# Patient Record
Sex: Female | Born: 1950 | Race: White | Hispanic: Yes | Marital: Married | Smoking: Never smoker
Health system: Southern US, Community
[De-identification: ages and names within clinical notes are randomized; demographics above are authoritative.]

---

## 2020-08-27 ENCOUNTER — Inpatient Hospital Stay (HOSPITAL_COMMUNITY)
Admission: EM | Admit: 2020-08-27 | Discharge: 2020-09-18 | DRG: 981 | Disposition: E | Payer: Medicare HMO | Attending: Internal Medicine | Admitting: Internal Medicine

## 2020-08-27 DIAGNOSIS — I428 Other cardiomyopathies: Secondary | ICD-10-CM | POA: Diagnosis present

## 2020-08-27 DIAGNOSIS — Z7984 Long term (current) use of oral hypoglycemic drugs: Secondary | ICD-10-CM

## 2020-08-27 DIAGNOSIS — E871 Hypo-osmolality and hyponatremia: Secondary | ICD-10-CM | POA: Diagnosis not present

## 2020-08-27 DIAGNOSIS — E43 Unspecified severe protein-calorie malnutrition: Secondary | ICD-10-CM | POA: Diagnosis present

## 2020-08-27 DIAGNOSIS — I130 Hypertensive heart and chronic kidney disease with heart failure and stage 1 through stage 4 chronic kidney disease, or unspecified chronic kidney disease: Secondary | ICD-10-CM | POA: Diagnosis present

## 2020-08-27 DIAGNOSIS — Z6829 Body mass index (BMI) 29.0-29.9, adult: Secondary | ICD-10-CM

## 2020-08-27 DIAGNOSIS — I69354 Hemiplegia and hemiparesis following cerebral infarction affecting left non-dominant side: Secondary | ICD-10-CM

## 2020-08-27 DIAGNOSIS — N1832 Chronic kidney disease, stage 3b: Secondary | ICD-10-CM | POA: Diagnosis present

## 2020-08-27 DIAGNOSIS — E1169 Type 2 diabetes mellitus with other specified complication: Secondary | ICD-10-CM | POA: Diagnosis present

## 2020-08-27 DIAGNOSIS — D582 Other hemoglobinopathies: Secondary | ICD-10-CM

## 2020-08-27 DIAGNOSIS — Z79899 Other long term (current) drug therapy: Secondary | ICD-10-CM

## 2020-08-27 DIAGNOSIS — I5021 Acute systolic (congestive) heart failure: Secondary | ICD-10-CM | POA: Diagnosis not present

## 2020-08-27 DIAGNOSIS — I69398 Other sequelae of cerebral infarction: Secondary | ICD-10-CM

## 2020-08-27 DIAGNOSIS — R0602 Shortness of breath: Secondary | ICD-10-CM

## 2020-08-27 DIAGNOSIS — Z794 Long term (current) use of insulin: Secondary | ICD-10-CM

## 2020-08-27 DIAGNOSIS — I313 Pericardial effusion (noninflammatory): Secondary | ICD-10-CM | POA: Diagnosis present

## 2020-08-27 DIAGNOSIS — E872 Acidosis: Secondary | ICD-10-CM | POA: Diagnosis not present

## 2020-08-27 DIAGNOSIS — E1165 Type 2 diabetes mellitus with hyperglycemia: Secondary | ICD-10-CM

## 2020-08-27 DIAGNOSIS — E1122 Type 2 diabetes mellitus with diabetic chronic kidney disease: Secondary | ICD-10-CM | POA: Diagnosis present

## 2020-08-27 DIAGNOSIS — R0600 Dyspnea, unspecified: Secondary | ICD-10-CM

## 2020-08-27 DIAGNOSIS — I462 Cardiac arrest due to underlying cardiac condition: Secondary | ICD-10-CM | POA: Diagnosis not present

## 2020-08-27 DIAGNOSIS — R569 Unspecified convulsions: Secondary | ICD-10-CM | POA: Diagnosis not present

## 2020-08-27 DIAGNOSIS — I4891 Unspecified atrial fibrillation: Secondary | ICD-10-CM

## 2020-08-27 DIAGNOSIS — R109 Unspecified abdominal pain: Secondary | ICD-10-CM

## 2020-08-27 DIAGNOSIS — D509 Iron deficiency anemia, unspecified: Secondary | ICD-10-CM | POA: Diagnosis present

## 2020-08-27 DIAGNOSIS — K7200 Acute and subacute hepatic failure without coma: Secondary | ICD-10-CM | POA: Diagnosis not present

## 2020-08-27 DIAGNOSIS — E8809 Other disorders of plasma-protein metabolism, not elsewhere classified: Secondary | ICD-10-CM | POA: Diagnosis present

## 2020-08-27 DIAGNOSIS — I509 Heart failure, unspecified: Secondary | ICD-10-CM

## 2020-08-27 DIAGNOSIS — Z452 Encounter for adjustment and management of vascular access device: Secondary | ICD-10-CM

## 2020-08-27 DIAGNOSIS — I4892 Unspecified atrial flutter: Secondary | ICD-10-CM | POA: Diagnosis not present

## 2020-08-27 DIAGNOSIS — I480 Paroxysmal atrial fibrillation: Secondary | ICD-10-CM | POA: Diagnosis not present

## 2020-08-27 DIAGNOSIS — Z20822 Contact with and (suspected) exposure to covid-19: Secondary | ICD-10-CM | POA: Diagnosis present

## 2020-08-27 DIAGNOSIS — G9340 Encephalopathy, unspecified: Secondary | ICD-10-CM | POA: Diagnosis present

## 2020-08-27 DIAGNOSIS — E11649 Type 2 diabetes mellitus with hypoglycemia without coma: Secondary | ICD-10-CM | POA: Diagnosis not present

## 2020-08-27 DIAGNOSIS — I081 Rheumatic disorders of both mitral and tricuspid valves: Secondary | ICD-10-CM | POA: Diagnosis present

## 2020-08-27 DIAGNOSIS — A419 Sepsis, unspecified organism: Secondary | ICD-10-CM | POA: Diagnosis not present

## 2020-08-27 DIAGNOSIS — Z515 Encounter for palliative care: Secondary | ICD-10-CM

## 2020-08-27 DIAGNOSIS — G40909 Epilepsy, unspecified, not intractable, without status epilepticus: Secondary | ICD-10-CM | POA: Diagnosis not present

## 2020-08-27 DIAGNOSIS — J9601 Acute respiratory failure with hypoxia: Secondary | ICD-10-CM | POA: Diagnosis not present

## 2020-08-27 DIAGNOSIS — N179 Acute kidney failure, unspecified: Secondary | ICD-10-CM | POA: Diagnosis not present

## 2020-08-27 DIAGNOSIS — Z8249 Family history of ischemic heart disease and other diseases of the circulatory system: Secondary | ICD-10-CM

## 2020-08-27 DIAGNOSIS — D696 Thrombocytopenia, unspecified: Secondary | ICD-10-CM | POA: Diagnosis present

## 2020-08-27 DIAGNOSIS — Z7982 Long term (current) use of aspirin: Secondary | ICD-10-CM

## 2020-08-27 DIAGNOSIS — E876 Hypokalemia: Secondary | ICD-10-CM | POA: Diagnosis not present

## 2020-08-27 DIAGNOSIS — R570 Cardiogenic shock: Secondary | ICD-10-CM | POA: Diagnosis not present

## 2020-08-27 DIAGNOSIS — I10 Essential (primary) hypertension: Secondary | ICD-10-CM | POA: Diagnosis present

## 2020-08-27 DIAGNOSIS — E785 Hyperlipidemia, unspecified: Secondary | ICD-10-CM | POA: Diagnosis present

## 2020-08-27 DIAGNOSIS — I952 Hypotension due to drugs: Secondary | ICD-10-CM

## 2020-08-27 DIAGNOSIS — R6521 Severe sepsis with septic shock: Secondary | ICD-10-CM | POA: Diagnosis not present

## 2020-08-27 DIAGNOSIS — I472 Ventricular tachycardia: Secondary | ICD-10-CM | POA: Diagnosis not present

## 2020-08-27 DIAGNOSIS — Z4509 Encounter for adjustment and management of other cardiac device: Secondary | ICD-10-CM

## 2020-08-28 ENCOUNTER — Inpatient Hospital Stay (HOSPITAL_COMMUNITY): Payer: Medicare HMO

## 2020-08-28 ENCOUNTER — Observation Stay (HOSPITAL_COMMUNITY): Payer: Medicare HMO

## 2020-08-28 ENCOUNTER — Emergency Department (HOSPITAL_COMMUNITY): Payer: Medicare HMO

## 2020-08-28 ENCOUNTER — Encounter (HOSPITAL_COMMUNITY): Payer: Self-pay | Admitting: Internal Medicine

## 2020-08-28 DIAGNOSIS — I4892 Unspecified atrial flutter: Secondary | ICD-10-CM | POA: Diagnosis not present

## 2020-08-28 DIAGNOSIS — R569 Unspecified convulsions: Secondary | ICD-10-CM | POA: Diagnosis present

## 2020-08-28 DIAGNOSIS — E871 Hypo-osmolality and hyponatremia: Secondary | ICD-10-CM | POA: Diagnosis not present

## 2020-08-28 DIAGNOSIS — I959 Hypotension, unspecified: Secondary | ICD-10-CM | POA: Diagnosis not present

## 2020-08-28 DIAGNOSIS — G40909 Epilepsy, unspecified, not intractable, without status epilepticus: Secondary | ICD-10-CM | POA: Diagnosis present

## 2020-08-28 DIAGNOSIS — E1169 Type 2 diabetes mellitus with other specified complication: Secondary | ICD-10-CM | POA: Diagnosis present

## 2020-08-28 DIAGNOSIS — I69354 Hemiplegia and hemiparesis following cerebral infarction affecting left non-dominant side: Secondary | ICD-10-CM | POA: Diagnosis not present

## 2020-08-28 DIAGNOSIS — I69398 Other sequelae of cerebral infarction: Secondary | ICD-10-CM | POA: Diagnosis not present

## 2020-08-28 DIAGNOSIS — I952 Hypotension due to drugs: Secondary | ICD-10-CM | POA: Diagnosis not present

## 2020-08-28 DIAGNOSIS — E43 Unspecified severe protein-calorie malnutrition: Secondary | ICD-10-CM | POA: Diagnosis present

## 2020-08-28 DIAGNOSIS — I5021 Acute systolic (congestive) heart failure: Secondary | ICD-10-CM | POA: Diagnosis not present

## 2020-08-28 DIAGNOSIS — N179 Acute kidney failure, unspecified: Secondary | ICD-10-CM | POA: Diagnosis not present

## 2020-08-28 DIAGNOSIS — J9601 Acute respiratory failure with hypoxia: Secondary | ICD-10-CM | POA: Diagnosis not present

## 2020-08-28 DIAGNOSIS — Z20822 Contact with and (suspected) exposure to covid-19: Secondary | ICD-10-CM | POA: Diagnosis present

## 2020-08-28 DIAGNOSIS — I472 Ventricular tachycardia: Secondary | ICD-10-CM | POA: Diagnosis not present

## 2020-08-28 DIAGNOSIS — I10 Essential (primary) hypertension: Secondary | ICD-10-CM

## 2020-08-28 DIAGNOSIS — R6521 Severe sepsis with septic shock: Secondary | ICD-10-CM | POA: Diagnosis not present

## 2020-08-28 DIAGNOSIS — E872 Acidosis: Secondary | ICD-10-CM | POA: Diagnosis not present

## 2020-08-28 DIAGNOSIS — R748 Abnormal levels of other serum enzymes: Secondary | ICD-10-CM | POA: Diagnosis not present

## 2020-08-28 DIAGNOSIS — K7200 Acute and subacute hepatic failure without coma: Secondary | ICD-10-CM | POA: Diagnosis not present

## 2020-08-28 DIAGNOSIS — N1832 Chronic kidney disease, stage 3b: Secondary | ICD-10-CM | POA: Diagnosis present

## 2020-08-28 DIAGNOSIS — D696 Thrombocytopenia, unspecified: Secondary | ICD-10-CM | POA: Diagnosis present

## 2020-08-28 DIAGNOSIS — E11649 Type 2 diabetes mellitus with hypoglycemia without coma: Secondary | ICD-10-CM | POA: Diagnosis not present

## 2020-08-28 DIAGNOSIS — I480 Paroxysmal atrial fibrillation: Secondary | ICD-10-CM | POA: Diagnosis not present

## 2020-08-28 DIAGNOSIS — Z515 Encounter for palliative care: Secondary | ICD-10-CM | POA: Diagnosis not present

## 2020-08-28 DIAGNOSIS — E1165 Type 2 diabetes mellitus with hyperglycemia: Secondary | ICD-10-CM

## 2020-08-28 DIAGNOSIS — A419 Sepsis, unspecified organism: Secondary | ICD-10-CM | POA: Diagnosis not present

## 2020-08-28 DIAGNOSIS — R570 Cardiogenic shock: Secondary | ICD-10-CM | POA: Diagnosis not present

## 2020-08-28 DIAGNOSIS — I081 Rheumatic disorders of both mitral and tricuspid valves: Secondary | ICD-10-CM | POA: Diagnosis present

## 2020-08-28 DIAGNOSIS — E1122 Type 2 diabetes mellitus with diabetic chronic kidney disease: Secondary | ICD-10-CM | POA: Diagnosis present

## 2020-08-28 DIAGNOSIS — G9340 Encephalopathy, unspecified: Secondary | ICD-10-CM | POA: Diagnosis present

## 2020-08-28 DIAGNOSIS — Z794 Long term (current) use of insulin: Secondary | ICD-10-CM

## 2020-08-28 DIAGNOSIS — I4891 Unspecified atrial fibrillation: Secondary | ICD-10-CM | POA: Diagnosis not present

## 2020-08-28 DIAGNOSIS — D509 Iron deficiency anemia, unspecified: Secondary | ICD-10-CM | POA: Diagnosis present

## 2020-08-28 DIAGNOSIS — I130 Hypertensive heart and chronic kidney disease with heart failure and stage 1 through stage 4 chronic kidney disease, or unspecified chronic kidney disease: Secondary | ICD-10-CM | POA: Diagnosis present

## 2020-08-28 HISTORY — DX: Chronic kidney disease, stage 3b: N18.32

## 2020-08-28 HISTORY — DX: Type 2 diabetes mellitus with hyperglycemia: E11.65

## 2020-08-28 HISTORY — DX: Type 2 diabetes mellitus with other specified complication: E11.69

## 2020-08-28 HISTORY — DX: Essential (primary) hypertension: I10

## 2020-08-28 HISTORY — DX: Long term (current) use of insulin: Z79.4

## 2020-08-28 LAB — PROTIME-INR
INR: 1.3 — ABNORMAL HIGH (ref 0.8–1.2)
Prothrombin Time: 15.9 seconds — ABNORMAL HIGH (ref 11.4–15.2)

## 2020-08-28 LAB — COMPREHENSIVE METABOLIC PANEL
ALT: 19 U/L (ref 0–44)
ALT: 20 U/L (ref 0–44)
AST: 32 U/L (ref 15–41)
AST: 34 U/L (ref 15–41)
Albumin: 3.5 g/dL (ref 3.5–5.0)
Albumin: 3.2 g/dL — ABNORMAL LOW (ref 3.5–5.0)
Alkaline Phosphatase: 66 U/L (ref 38–126)
Alkaline Phosphatase: 62 U/L (ref 38–126)
Anion gap: 14 (ref 5–15)
Anion gap: 14 (ref 5–15)
BUN: 21 mg/dL (ref 8–23)
BUN: 21 mg/dL (ref 8–23)
CO2: 21 mmol/L — ABNORMAL LOW (ref 22–32)
CO2: 22 mmol/L (ref 22–32)
Calcium: 9.9 mg/dL (ref 8.9–10.3)
Calcium: 9.7 mg/dL (ref 8.9–10.3)
Chloride: 98 mmol/L (ref 98–111)
Chloride: 98 mmol/L (ref 98–111)
Creatinine, Ser: 1.74 mg/dL — ABNORMAL HIGH (ref 0.44–1.00)
Creatinine, Ser: 1.71 mg/dL — ABNORMAL HIGH (ref 0.44–1.00)
GFR, Estimated: 31 mL/min — ABNORMAL LOW (ref >60)
GFR, Estimated: 32 mL/min — ABNORMAL LOW (ref >60)
Glucose, Bld: 339 mg/dL — ABNORMAL HIGH (ref 70–99)
Glucose, Bld: 354 mg/dL — ABNORMAL HIGH (ref 70–99)
Potassium: 4.7 mmol/L (ref 3.5–5.1)
Potassium: 4.9 mmol/L (ref 3.5–5.1)
Sodium: 133 mmol/L — ABNORMAL LOW (ref 135–145)
Sodium: 134 mmol/L — ABNORMAL LOW (ref 135–145)
Total Bilirubin: 1.3 mg/dL — ABNORMAL HIGH (ref 0.3–1.2)
Total Bilirubin: 1.4 mg/dL — ABNORMAL HIGH (ref 0.3–1.2)
Total Protein: 7.2 g/dL (ref 6.5–8.1)
Total Protein: 6.7 g/dL (ref 6.5–8.1)

## 2020-08-28 LAB — LIPID PANEL
Cholesterol: 111 mg/dL (ref 0–200)
HDL: 54 mg/dL (ref >40)
LDL Cholesterol: 40 mg/dL (ref 0–99)
Total CHOL/HDL Ratio: 2.1 RATIO
Triglycerides: 87 mg/dL (ref <150)
VLDL: 17 mg/dL (ref 0–40)

## 2020-08-28 LAB — CBC
HCT: 38.8 % (ref 36.0–46.0)
HCT: 41.6 % (ref 36.0–46.0)
Hemoglobin: 12.3 g/dL (ref 12.0–15.0)
Hemoglobin: 12.5 g/dL (ref 12.0–15.0)
MCH: 22 pg — ABNORMAL LOW (ref 26.0–34.0)
MCH: 22.8 pg — ABNORMAL LOW (ref 26.0–34.0)
MCHC: 30 g/dL (ref 30.0–36.0)
MCHC: 31.7 g/dL (ref 30.0–36.0)
MCV: 71.9 fL — ABNORMAL LOW (ref 80.0–100.0)
MCV: 73.4 fL — ABNORMAL LOW (ref 80.0–100.0)
Platelets: 147 10*3/uL — ABNORMAL LOW (ref 150–400)
Platelets: 165 10*3/uL (ref 150–400)
RBC: 5.4 MIL/uL — ABNORMAL HIGH (ref 3.87–5.11)
RBC: 5.67 MIL/uL — ABNORMAL HIGH (ref 3.87–5.11)
RDW: 17 % — ABNORMAL HIGH (ref 11.5–15.5)
RDW: 17.1 % — ABNORMAL HIGH (ref 11.5–15.5)
WBC: 6.4 10*3/uL (ref 4.0–10.5)
WBC: 6.5 10*3/uL (ref 4.0–10.5)
nRBC: 0 % (ref 0.0–0.2)
nRBC: 0 % (ref 0.0–0.2)

## 2020-08-28 LAB — I-STAT CHEM 8, ED
BUN: 22 mg/dL (ref 8–23)
Calcium, Ion: 1.06 mmol/L — ABNORMAL LOW (ref 1.15–1.40)
Chloride: 100 mmol/L (ref 98–111)
Creatinine, Ser: 1.6 mg/dL — ABNORMAL HIGH (ref 0.44–1.00)
Glucose, Bld: 343 mg/dL — ABNORMAL HIGH (ref 70–99)
HCT: 44 % (ref 36.0–46.0)
Hemoglobin: 15 g/dL (ref 12.0–15.0)
Potassium: 4.7 mmol/L (ref 3.5–5.1)
Sodium: 135 mmol/L (ref 135–145)
TCO2: 23 mmol/L (ref 22–32)

## 2020-08-28 LAB — CBG MONITORING, ED
Glucose-Capillary: 121 mg/dL — ABNORMAL HIGH (ref 70–99)
Glucose-Capillary: 269 mg/dL — ABNORMAL HIGH (ref 70–99)
Glucose-Capillary: 314 mg/dL — ABNORMAL HIGH (ref 70–99)
Glucose-Capillary: 324 mg/dL — ABNORMAL HIGH (ref 70–99)
Glucose-Capillary: 93 mg/dL (ref 70–99)

## 2020-08-28 LAB — DIFFERENTIAL
Abs Immature Granulocytes: 0.01 10*3/uL (ref 0.00–0.07)
Basophils Absolute: 0 10*3/uL (ref 0.0–0.1)
Basophils Relative: 0 %
Eosinophils Absolute: 0 10*3/uL (ref 0.0–0.5)
Eosinophils Relative: 0 %
Immature Granulocytes: 0 %
Lymphocytes Relative: 21 %
Lymphs Abs: 1.3 10*3/uL (ref 0.7–4.0)
Monocytes Absolute: 0.4 10*3/uL (ref 0.1–1.0)
Monocytes Relative: 6 %
Neutro Abs: 4.6 10*3/uL (ref 1.7–7.7)
Neutrophils Relative %: 73 %

## 2020-08-28 LAB — SARS CORONAVIRUS 2 (TAT 6-24 HRS): SARS Coronavirus 2: NEGATIVE

## 2020-08-28 LAB — HEMOGLOBIN A1C
Hgb A1c MFr Bld: 7.9 % — ABNORMAL HIGH (ref 4.8–5.6)
Mean Plasma Glucose: 180.03 mg/dL

## 2020-08-28 LAB — APTT: aPTT: 30 seconds (ref 24–36)

## 2020-08-28 MED ORDER — SODIUM CHLORIDE 0.9 % IV BOLUS
1000.0000 mL | Freq: Once | INTRAVENOUS | Status: AC
  Administered 2020-08-28: 1000 mL via INTRAVENOUS

## 2020-08-28 MED ORDER — ASPIRIN EC 81 MG PO TBEC
81.0000 mg | DELAYED_RELEASE_TABLET | Freq: Every day | ORAL | Status: DC
  Administered 2020-08-29 – 2020-09-04 (×6): 81 mg via ORAL
  Filled 2020-08-28 (×8): qty 1

## 2020-08-28 MED ORDER — ENOXAPARIN SODIUM 40 MG/0.4ML IJ SOSY
40.0000 mg | PREFILLED_SYRINGE | INTRAMUSCULAR | Status: DC
  Administered 2020-08-28 – 2020-09-01 (×5): 40 mg via SUBCUTANEOUS
  Filled 2020-08-28 (×5): qty 0.4

## 2020-08-28 MED ORDER — IOHEXOL 350 MG/ML SOLN
50.0000 mL | Freq: Once | INTRAVENOUS | Status: AC | PRN
  Administered 2020-08-28: 50 mL via INTRAVENOUS

## 2020-08-28 MED ORDER — INSULIN ASPART 100 UNIT/ML IJ SOLN
0.0000 [IU] | Freq: Three times a day (TID) | INTRAMUSCULAR | Status: DC
  Administered 2020-08-28: 11 [IU] via SUBCUTANEOUS
  Administered 2020-08-28: 8 [IU] via SUBCUTANEOUS

## 2020-08-28 MED ORDER — INSULIN GLARGINE-YFGN 100 UNIT/ML ~~LOC~~ SOLN
15.0000 [IU] | Freq: Every day | SUBCUTANEOUS | Status: DC
  Administered 2020-08-29 – 2020-09-01 (×4): 15 [IU] via SUBCUTANEOUS
  Filled 2020-08-28 (×7): qty 0.15

## 2020-08-28 MED ORDER — POLYETHYLENE GLYCOL 3350 17 G PO PACK
17.0000 g | PACK | Freq: Every day | ORAL | Status: DC | PRN
  Administered 2020-08-31: 17 g via ORAL
  Filled 2020-08-28: qty 1

## 2020-08-28 MED ORDER — LORAZEPAM 2 MG/ML IJ SOLN
INTRAMUSCULAR | Status: AC
  Filled 2020-08-28: qty 4

## 2020-08-28 MED ORDER — LEVETIRACETAM IN NACL 1000 MG/100ML IV SOLN
INTRAVENOUS | Status: AC
  Administered 2020-08-28: 1000 mg via INTRAVENOUS
  Filled 2020-08-28: qty 200

## 2020-08-28 MED ORDER — INSULIN ASPART 100 UNIT/ML IJ SOLN
0.0000 [IU] | Freq: Every day | INTRAMUSCULAR | Status: DC
  Administered 2020-08-29 – 2020-09-03 (×3): 2 [IU] via SUBCUTANEOUS

## 2020-08-28 MED ORDER — SODIUM CHLORIDE 0.9 % IV SOLN: INTRAVENOUS | Status: DC

## 2020-08-28 MED ORDER — ACETAMINOPHEN 650 MG RE SUPP: 650.0000 mg | RECTAL | Status: DC | PRN

## 2020-08-28 MED ORDER — INSULIN GLARGINE-YFGN 100 UNIT/ML ~~LOC~~ SOLN
30.0000 [IU] | Freq: Every day | SUBCUTANEOUS | Status: DC
  Filled 2020-08-28 (×2): qty 0.3

## 2020-08-28 MED ORDER — ONDANSETRON HCL 4 MG/2ML IJ SOLN
INTRAMUSCULAR | Status: AC
  Filled 2020-08-28: qty 2

## 2020-08-28 MED ORDER — ONDANSETRON HCL 4 MG/2ML IJ SOLN
4.0000 mg | Freq: Four times a day (QID) | INTRAMUSCULAR | Status: DC | PRN
  Administered 2020-08-31 – 2020-09-07 (×11): 4 mg via INTRAVENOUS
  Filled 2020-08-28 (×12): qty 2

## 2020-08-28 MED ORDER — ACETAMINOPHEN 160 MG/5ML PO SOLN: 650.0000 mg | ORAL | Status: DC | PRN

## 2020-08-28 MED ORDER — ACETAMINOPHEN 325 MG PO TABS
650.0000 mg | ORAL_TABLET | ORAL | Status: DC | PRN
  Administered 2020-08-29: 650 mg via ORAL
  Filled 2020-08-28: qty 2

## 2020-08-28 MED ORDER — LEVETIRACETAM IN NACL 500 MG/100ML IV SOLN
500.0000 mg | Freq: Two times a day (BID) | INTRAVENOUS | Status: DC
  Administered 2020-08-28 – 2020-08-29 (×2): 500 mg via INTRAVENOUS
  Filled 2020-08-28 (×4): qty 100

## 2020-08-28 MED ORDER — INSULIN ASPART 100 UNIT/ML IJ SOLN
0.0000 [IU] | Freq: Three times a day (TID) | INTRAMUSCULAR | Status: DC
  Administered 2020-08-28: 2 [IU] via SUBCUTANEOUS
  Administered 2020-08-29: 3 [IU] via SUBCUTANEOUS
  Administered 2020-08-29: 5 [IU] via SUBCUTANEOUS
  Administered 2020-08-29: 2 [IU] via SUBCUTANEOUS
  Administered 2020-08-30: 3 [IU] via SUBCUTANEOUS
  Administered 2020-08-30: 5 [IU] via SUBCUTANEOUS
  Administered 2020-08-30: 3 [IU] via SUBCUTANEOUS
  Administered 2020-09-01: 8 [IU] via SUBCUTANEOUS
  Administered 2020-09-01: 2 [IU] via SUBCUTANEOUS
  Administered 2020-09-02 (×2): 8 [IU] via SUBCUTANEOUS
  Administered 2020-09-02: 5 [IU] via SUBCUTANEOUS
  Administered 2020-09-03 (×2): 8 [IU] via SUBCUTANEOUS
  Administered 2020-09-03: 5 [IU] via SUBCUTANEOUS
  Administered 2020-09-04 – 2020-09-05 (×4): 2 [IU] via SUBCUTANEOUS
  Administered 2020-09-06: 5 [IU] via SUBCUTANEOUS
  Administered 2020-09-06 – 2020-09-08 (×3): 3 [IU] via SUBCUTANEOUS

## 2020-08-28 MED ORDER — LEVETIRACETAM IN NACL 1000 MG/100ML IV SOLN
1000.0000 mg | INTRAVENOUS | Status: AC
Start: 2020-08-28 — End: 2020-08-28

## 2020-08-28 MED ORDER — IOHEXOL 350 MG/ML SOLN
75.0000 mL | Freq: Once | INTRAVENOUS | Status: AC | PRN
  Administered 2020-08-28: 75 mL via INTRAVENOUS

## 2020-08-28 MED ORDER — STROKE: EARLY STAGES OF RECOVERY BOOK
Freq: Once | Status: AC
  Filled 2020-08-28: qty 1

## 2020-08-28 MED ORDER — SODIUM CHLORIDE 0.9% FLUSH: 3.0000 mL | Freq: Once | INTRAVENOUS | Status: DC

## 2020-08-28 MED ORDER — LEVETIRACETAM IN NACL 1000 MG/100ML IV SOLN
INTRAVENOUS | Status: AC
  Filled 2020-08-28: qty 200

## 2020-08-28 MED ORDER — LORAZEPAM 2 MG/ML IJ SOLN: 2.0000 mg | Freq: Once | INTRAMUSCULAR | Status: DC | PRN

## 2020-08-28 MED ORDER — ROSUVASTATIN CALCIUM 5 MG PO TABS
10.0000 mg | ORAL_TABLET | Freq: Every day | ORAL | Status: DC
  Administered 2020-08-29 – 2020-08-30 (×2): 10 mg via ORAL
  Filled 2020-08-28 (×3): qty 2

## 2020-08-28 NOTE — Code Documentation (Signed)
Stroke Response Nurse Documentation Code Documentation  Erica Duncan is a 70 y.o. female arriving to [facility] ED via [facility] on 8/11 with unknown past medical hx. Code stroke was activated by EMS. Patient from home where she was LKW at 1930 and now complaining of Left sided weakness and aphasia . On No antithrombotic. Stroke team at the bedside on patient arrival. Labs drawn and patient cleared for CT by Dr. Madilyn Hook. Patient to CT with team. NIHSS 10, see documentation for details and code stroke times. Patient with left gaze preference , left arm weakness, left leg weakness, left decreased sensation, Global aphasia , dysarthria , and Sensory  neglect on exam. The following imaging was completed:  CT, CTA head and neck. Patient is not a candidate for tPA due to Out of window. Bedside handoff with ED RN Josh.    Rose Fillers  Rapid Response RN

## 2020-08-28 NOTE — Progress Notes (Signed)
EEG complete - results pending 

## 2020-08-28 NOTE — ED Notes (Signed)
Pt too medicated from ativan to attempt the swallow screeen

## 2020-08-28 NOTE — ED Triage Notes (Signed)
Pt BIB [facility] for stroke-like symptoms.  LKW 1930 per family.  Symptoms included difficulty speaking  and walking as reported by family.  EMS reported Left sided weakness with facial droop and aphasia. Symptoms were intermittent in transit.  EMS reports pt was A&O x4 on scene with help of translation by family.  Pt has hx of DM and HTN.  EMS reports BP 160/83, HR 118  Daughter number is [phone] per [facility]

## 2020-08-28 NOTE — Progress Notes (Signed)
Subjective: Daughter at bedside states patient continues to be very lethargic.  States earlier she was able to follow some commands but since 9am or so in the morning she has not been waking up to follow any commands.  Also reports that she has noticed intermittent left upper extremity twitching.  ROS: Unable to obtain due to poor mental status  Examination  Vital signs in last 24 hours: Temp:  [100.4 F (38 C)] 100.4 F (38 C) (08/11 1147) Pulse Rate:  [93-118] 99 (08/11 1100) Resp:  [16-32] 18 (08/11 1100) BP: (81-119)/(46-89) 110/69 (08/11 1100) SpO2:  [95 %-100 %] 96 % (08/11 1100) Weight:  [60.8 kg] 60.8 kg (08/11 0010)  General: lying in bed, NAD CVS: pulse-normal rate and rhythm RS: breathing comfortably, CTAB Extremities: normal, warm  Neuro: Moans but does not open eyes to noxious stimuli, does not follow commands, PERRLA, no gaze deviation, no apparent facial asymmetry, withdraws to noxious stimuli in all 4 extremities  Basic Metabolic Panel: Recent Labs  Lab 08/28/20 0001 08/28/20 0012 08/28/20 0801  NA 133* 135 134*  K 4.7 4.7 4.9  CL 98 100 98  CO2 21*  --  22  GLUCOSE 339* 343* 354*  BUN 21 22 21   CREATININE 1.74* 1.60* 1.71*  CALCIUM 9.9  --  9.7    CBC: Recent Labs  Lab 08/28/20 0001 08/28/20 0012 08/28/20 0801  WBC 6.4  --  6.5  NEUTROABS 4.6  --   --   HGB 12.5 15.0 12.3  HCT 41.6 44.0 38.8  MCV 73.4*  --  71.9*  PLT 165  --  147*     Coagulation Studies: Recent Labs    08/28/20 0001  LABPROT 15.9*  INR 1.3*    Imaging MRI brain without contrast 08/28/2020: Motion degraded study.  The study was terminated prematurely. Negative for acute infarct.  CT head without contrast 08/28/2020: No acute intracranial infarct or other abnormality. ASPECTS is 10.Chronic right MCA distribution infarct involving the posterior right temporoparietal region. Underlying atrophy with moderate chronic microvascular ischemic disease.  CTA head and neck  8/11/20212:  Negative CTA for emergent large vessel occlusion. Mild atheromatous change about the carotid siphons without hemodynamically significant or correctable stenosis. Moderate right greater than left layering pleural effusions, partially visualized.  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 Additionally there is evidence of cortical dysfunction in right hemisphere likely secondary to underlying structural abnormality/stroke. There is also moderate diffuse encephalopathy, nonspecific to etiology. No seizures were seen throughout the recording.  Recommendations -Continue Keppra 500 mg twice daily. -As patient continues to be lethargic, will obtain video EEG to look for intermittent seizures.  If it shows any further seizures, will increase Keppra -Patient is afebrile, no leukocytosis, no neck rigidity. Therefore low suspicion for meningitis.  However, if no evidence of seizures on EEG, will consider lumbar puncture -Continue seizure precautions -As needed IV Ativan 2 mg for clinical seizure-like activity -Management of rest of comorbidities per primary team  I have spent a total of  35 minutes with the patient reviewing hospital notes,  test results, labs and examining the patient as well as establishing an  assessment and plan that was discussed personally with the patient's daughter at bedside.  > 50% of time was spent in direct patient care.     Lindie Spruce Epilepsy [facility] For questions after 5pm please  refer to AMION to reach the Neurologist on call

## 2020-08-28 NOTE — ED Notes (Signed)
Pt still sleeping...

## 2020-08-28 NOTE — Progress Notes (Signed)
Patient ID: Erica Duncan, female   DOB: 12-Jul-1950, 70 y.o.   MRN: [id number] Patient admitted early this morning for left-sided weakness and confusion and neurology has been consulted. I have reviewed patient's medical records including this morning's H&P, current vitals, labs, medications myself.  Follow MRI of brain and EEG.  Follow further neurology recommendations.

## 2020-08-28 NOTE — Progress Notes (Signed)
Occupational Therapy Evaluation Patient Details Name: Erica Duncan MRN: [id number] DOB: 12/12/1950 Today's Date: 08/28/2020    History of Present Illness Pt is a 70yo female presenting to [facility] ED on 8/10 with acute-onset L weakness and confusion.  Pt was observed to have seizure like activity in ED.  MRI 8/11 with no acute findings. PMH: R MCA distribution infarct involving the posterior right temporoparietal region and L frontal infarct (2021 in [place] HTN, CKD.   Clinical Impression   Assessment limited by level of arousal. Discussed with nsg. BP 90/61 supine and 93/61 in chair position @ bed level. HR 95; SpO2 97 RA. Pt intermittently following commands and very difficult to arouse. Briefly opening eyes. At baseline, pt independent with ADL and mobility with residual cognitive and vision deficits from prior CVA. Will follow acutely and further assess need for rehab as level of arousal improves.     Follow Up Recommendations  Other (comment) (to be further assessed)    Equipment Recommendations       Recommendations for Other Services       Precautions / Restrictions Precautions Precautions: Fall      Mobility Bed Mobility Overal bed mobility: Needs Assistance             General bed mobility comments: Pt placed in chair position in bed (HOB >45 degrees) to see if pt's alertness would improve. Did note that pt would open eyes after repeated attempts. Was also able to state name and follow simple physical commands, but would quickly fall back asleep. Further mobility deferred secondary to lethargy.    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 Most likely L field cut     Perception     Praxis      Pertinent Vitals/Pain Pain Assessment: Faces Faces Pain Scale: No hurt     Hand Dominance Right   Extremity/Trunk Assessment Upper Extremity Assessment Upper Extremity Assessment:  (difficult to assess due to level of arousal)       Cervical / Trunk Assessment Cervical / Trunk Assessment: Normal   Communication Communication Communication: Prefers language other than English (Spanish, daughter requested to translate)   Cognition Arousal/Alertness: Lethargic;Suspect due to medications (? post ictal) Behavior During Therapy: Flat affect Overall Cognitive Status: Difficult to assess                                 General Comments: Pt very lethargic and generally unable to be roused. Pt was able to say her name and perform some basic physical movements,  but would quickly fall back to sleep.   General Comments  daughter present adn educated on need to further assess her Mom once level of arousal improves    Exercises     Shoulder Instructions      Home Living Family/patient expects to be discharged to:: Private residence Living Arrangements: Children Available Help at Discharge: Family (Pt's daughter available sometimes but unsure if 24/7 is possible) Type of Home: House Home Access: Stairs to enter Secretary/administrator of Steps: 4 Entrance Stairs-Rails: Right Home Layout: One level     Bathroom Shower/Tub: Producer, television/film/video: Handicapped height Bathroom Accessibility: Yes   Home Equipment: Cane - single point   Additional Comments: Family remodeling the other bathroom to make it more accessible      Prior Functioning/Environment Level of Independence: Independent        Comments:  since stroke has not driven; slower processing; vision deficits from prior CVA        OT Problem List: Decreased activity tolerance;Impaired balance (sitting and/or standing);Impaired vision/perception;Decreased coordination;Decreased cognition;Decreased safety awareness;Decreased knowledge of use of DME or AE      OT Treatment/Interventions: Self-care/ADL training;Therapeutic exercise;Neuromuscular education;DME and/or AE instruction;Therapeutic activities;Cognitive remediation/compensation;Visual/perceptual remediation/compensation;Patient/family education;Balance training    OT Goals(Current goals can be found in the care plan section) Acute Rehab OT Goals Patient Stated Goal: per family to get better and go home OT Goal Formulation: With family Time For Goal Achievement: 09/11/20 Potential to Achieve Goals: Good  OT Frequency: Min 2X/week   Barriers to D/C:            Co-evaluation PT/OT/SLP Co-Evaluation/Treatment: Yes Reason for Co-Treatment: Complexity of the patient's impairments (multi-system involvement);For patient/therapist safety   OT goals addressed during session: ADL's and self-care      AM-PAC OT "6 Clicks" Daily Activity     Outcome Measure Help from another person eating meals?: Total Help from another person taking care of personal grooming?: Total Help from another person toileting, which includes using toliet, bedpan, or urinal?: Total Help from another person bathing (including washing, rinsing, drying)?: Total Help from another person to put on and taking off regular upper body clothing?: Total Help from another person to put on and taking off regular lower body clothing?: Total 6 Click Score: 6   End of Session Nurse Communication: Other (comment) (level of arousal; BP)  Activity Tolerance: Patient limited by lethargy Patient left: in bed;with call bell/phone within reach;with bed alarm set;with family/visitor present  OT Visit Diagnosis: Other  abnormalities of gait and mobility (R26.89);Muscle weakness (generalized) (M62.81);Other symptoms and signs involving cognitive function                Time: 8115-7262 OT Time Calculation (min): 26 min Charges:  OT General Charges $OT Visit: 1 Visit OT Evaluation $OT Eval Moderate Complexity: 1 Mod  [name] Zabriskie, OT/L   Acute OT Clinical Specialist Acute Rehabilitation Services Pager [phone] Office [phone]   [facility] 08/28/2020, 4:47 PM

## 2020-08-28 NOTE — ED Provider Notes (Signed)
[facility] EMERGENCY DEPARTMENT Provider Note   CSN: [id number] Arrival date & time: 09/15/2020  2359  An emergency department physician performed an initial assessment on this suspected stroke patient at 0008.  History No chief complaint on file.   Erica Duncan is a 70 y.o. female.  The history is provided by the patient and the EMS personnel.  Erica Duncan is a 70 y.o. female who presents to the Emergency Department complaining of code stroke. She presents the emergency department by EMS for evaluation of code stroke. Level V caveat due to confusion. History is provided by EMS. Patient was with her daughter and last known well at 44. EMS was called out due to concern for possible stroke. She was found to have abnormal gait speech. EMS reports stuttering left-sided deficits. Symptoms are severe and waxing and waning.  Additional hx available from patient's daughter after patient's initial ED evaluation.   Daughter states that they have been travelling all day from [place]  Pt developed nausea and vomiting and around 9pm started to appear more confused and had difficulty with ambulation.  Has a hx/o DM, HTN, CVA.       No past medical history on file.  There are no problems to display for this patient.      OB History   No obstetric history on file.     No family history on file.     Home Medications Prior to Admission medications   Not on File    Allergies    Patient has no allergy information on record.  Review of Systems   Review of Systems  All other systems reviewed and are negative.  Physical Exam Updated Vital Signs BP 99/67   Pulse 93   Resp (!) 21   Wt 60.8 kg   SpO2 100%   Physical Exam Vitals and nursing note reviewed.  Constitutional:      Appearance: She is well-developed.  HENT:     Head: Normocephalic and atraumatic.  Cardiovascular:     Rate and Rhythm: Regular rhythm. Tachycardia  present.     Heart sounds: No murmur heard. Pulmonary:     Effort: Pulmonary effort is normal. No respiratory distress.     Breath sounds: Normal breath sounds.  Abdominal:     Palpations: Abdomen is soft.     Tenderness: There is no abdominal tenderness. There is no guarding or rebound.  Musculoskeletal:        General: No tenderness.  Skin:    General: Skin is warm and dry.  Neurological:     Mental Status: She is alert and oriented to person, place, and time.     Comments: No asymmetry of facial movement.  4/5 strength in LUE, 1/5 strength in LLE.  5/5 strength in RUE, RLE.    Psychiatric:        Behavior: Behavior normal.    ED Results / Procedures / Treatments   Labs (all labs ordered are listed, but only abnormal results are displayed) Labs Reviewed  PROTIME-INR - Abnormal; Notable for the following components:      Result Value   Prothrombin Time 15.9 (*)    INR 1.3 (*)    All other components within normal limits  CBC - Abnormal; Notable for the following components:   RBC 5.67 (*)    MCV 73.4 (*)    MCH 22.0 (*)    RDW 17.1 (*)    All other components within  normal limits  COMPREHENSIVE METABOLIC PANEL - Abnormal; Notable for the following components:   Sodium 133 (*)    CO2 21 (*)    Glucose, Bld 339 (*)    Creatinine, Ser 1.74 (*)    Total Bilirubin 1.3 (*)    GFR, Estimated 31 (*)    All other components within normal limits  I-STAT CHEM 8, ED - Abnormal; Notable for the following components:   Creatinine, Ser 1.60 (*)    Glucose, Bld 343 (*)    Calcium, Ion 1.06 (*)    All other components within normal limits  CBG MONITORING, ED - Abnormal; Notable for the following components:   Glucose-Capillary 324 (*)    All other components within normal limits  APTT  DIFFERENTIAL    EKG None  Radiology CT CEREBRAL PERFUSION W CONTRAST  Result Date: 08/28/2020 CLINICAL DATA:  Initial evaluation for acute stroke. EXAM: CT ANGIOGRAPHY HEAD AND NECK  TECHNIQUE: Multidetector CT imaging of the head and neck was performed using the standard protocol during bolus administration of intravenous contrast. Multiplanar CT image reconstructions and MIPs were obtained to evaluate the vascular anatomy. Carotid stenosis measurements (when applicable) are obtained utilizing NASCET criteria, using the distal internal carotid diameter as the denominator. CONTRAST:  56mL OMNIPAQUE IOHEXOL 350 MG/ML SOLN COMPARISON:  Prior head CT from earlier the same day. FINDINGS: CTA NECK FINDINGS Aortic arch: Visualized aortic arch normal in caliber with normal branch pattern. Mild atheromatous change about the arch itself. No hemodynamically significant stenosis about the origin the great vessels. Right carotid system: Right common and internal carotid arteries widely patent without stenosis, dissection or occlusion. Left carotid system: Left common and internal carotid arteries widely patent without stenosis, dissection or occlusion. Vertebral arteries: Both vertebral arteries arise from the subclavian arteries. No proximal subclavian artery stenosis. Both vertebral arteries widely patent without stenosis, dissection or occlusion. Skeleton: No visible acute osseous finding. No discrete or worrisome osseous lesions. Patient is edentulous. Other neck: No other acute soft tissue abnormality within the neck. No mass or adenopathy. Upper chest: Moderate right greater than left layering pleural effusions, partially visualized. Visualized upper chest demonstrates no other acute abnormality. Review of the MIP images confirms the above findings CTA HEAD FINDINGS Anterior circulation: Petrous segments widely patent. Mild atheromatous change within the carotid siphons without significant stenosis. A1 segments patent bilaterally. Left A1 duplicated, with duplication of the left M1 segment. Normal anterior communicating artery complex. Anterior cerebral arteries patent to their distal aspects without  stenosis. No M1 stenosis or occlusion. Normal MCA bifurcations. Distal MCA branches well perfused and symmetric. Posterior circulation: Both V4 segments patent to the vertebrobasilar junction without stenosis. Right PICA origin patent and normal. Left PICA not definitely seen. Basilar patent to its distal aspect without stenosis. Superior cerebellar arteries patent bilaterally. Both PCAs primarily supplied via the basilar well perfused to their distal aspects. Venous sinuses: Grossly patent allowing for timing the contrast bolus. Anatomic variants: None significant.  No aneurysm. Review of the MIP images confirms the above findings IMPRESSION: 1. Negative CTA for emergent large vessel occlusion. 2. Mild atheromatous change about the carotid siphons without hemodynamically significant or correctable stenosis. 3. Moderate right greater than left layering pleural effusions, partially visualized. Results discussed by telephone on 08/28/2020 at approximately 12:50 am with provider Brooke Dare , who verbally acknowledged these results. Electronically Signed   By: Rise Mu M.D.   On: 08/28/2020 01:11   CT HEAD CODE STROKE WO CONTRAST  Result Date:  08/28/2020 CLINICAL DATA:  Code stroke. Initial evaluation for acute left-sided weakness. EXAM: CT HEAD WITHOUT CONTRAST TECHNIQUE: Contiguous axial images were obtained from the base of the skull through the vertex without intravenous contrast. COMPARISON:  None available. FINDINGS: Brain: Age-related cerebral atrophy with chronic microvascular ischemic disease. Encephalomalacia involving the right insula and posterior right temporoparietal region consistent with a chronic right MCA distribution infarct. No acute intracranial hemorrhage. No visible acute large vessel territory infarct. No mass lesion or midline shift. No hydrocephalus or extra-axial fluid collection. Vascular: No hyperdense vessel. Scattered vascular calcifications noted within the carotid  siphons. Skull: Scalp soft tissues and calvarium within normal limits. Sinuses/Orbits: Globes and orbital soft tissues within normal limits. Paranasal sinuses are clear. No mastoid effusion. Other: None. ASPECTS [facility] Stroke Program Early CT Score) - Ganglionic level infarction (caudate, lentiform nuclei, internal capsule, insula, M1-M3 cortex): 7 - Supraganglionic infarction (M4-M6 cortex): 3 Total score (0-10 with 10 being normal): 10 IMPRESSION: 1. No acute intracranial infarct or other abnormality. 2. ASPECTS is 10. 3. 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 4. Underlying atrophy with moderate chronic microvascular ischemic disease. These results were communicated to Dr. Iver Nestle at 12:24 am on 08/28/2020 by text page via the [facility] messaging system. Electronically Signed   By: Rise Mu M.D.   On: 08/28/2020 00:26   CT ANGIO HEAD NECK W WO CM (CODE STROKE)  Result Date: 08/28/2020 CLINICAL DATA:  Initial evaluation for acute stroke. EXAM: CT ANGIOGRAPHY HEAD AND NECK TECHNIQUE: Multidetector CT imaging of the head and neck was performed using the standard protocol during bolus administration of intravenous contrast. Multiplanar CT image reconstructions and MIPs were obtained to evaluate the vascular anatomy. Carotid stenosis measurements (when applicable) are obtained utilizing NASCET criteria, using the distal internal carotid diameter as the denominator. CONTRAST:  27mL OMNIPAQUE IOHEXOL 350 MG/ML SOLN COMPARISON:  Prior head CT from earlier the same day. FINDINGS: CTA NECK FINDINGS Aortic arch: Visualized aortic arch normal in caliber with normal branch pattern. Mild atheromatous change about the arch itself. No hemodynamically significant stenosis about the origin the great vessels. Right carotid system: Right common and internal carotid arteries widely patent without stenosis, dissection or occlusion. Left carotid system: Left common and  internal carotid arteries widely patent without stenosis, dissection or occlusion. Vertebral arteries: Both vertebral arteries arise from the subclavian arteries. No proximal subclavian artery stenosis. Both vertebral arteries widely patent without stenosis, dissection or occlusion. Skeleton: No visible acute osseous finding. No discrete or worrisome osseous lesions. Patient is edentulous. Other neck: No other acute soft tissue abnormality within the neck. No mass or adenopathy. Upper chest: Moderate right greater than left layering pleural effusions, partially visualized. Visualized upper chest demonstrates no other acute abnormality. Review of the MIP images confirms the above findings CTA HEAD FINDINGS Anterior circulation: Petrous segments widely patent. Mild atheromatous change within the carotid siphons without significant stenosis. A1 segments patent bilaterally. Left A1 duplicated, with duplication of the left M1 segment. Normal anterior communicating artery complex. Anterior cerebral arteries patent to their distal aspects without stenosis. No M1 stenosis or occlusion. Normal MCA bifurcations. Distal MCA branches well perfused and symmetric. Posterior circulation: Both V4 segments patent to the vertebrobasilar junction without stenosis. Right PICA origin patent and normal. Left PICA not definitely seen. Basilar patent to its distal aspect without stenosis. Superior cerebellar arteries patent bilaterally. Both PCAs primarily supplied via the basilar well perfused to their distal aspects. Venous sinuses: Grossly patent allowing for  timing the contrast bolus. Anatomic variants: None significant.  No aneurysm. Review of the MIP images confirms the above findings IMPRESSION: 1. Negative CTA for emergent large vessel occlusion. 2. Mild atheromatous change about the carotid siphons without hemodynamically significant or correctable stenosis. 3. Moderate right greater than left layering pleural effusions, partially  visualized. Results discussed by telephone on 08/28/2020 at approximately 12:50 am with provider Brooke Dare , who verbally acknowledged these results. Electronically Signed   By: Rise Mu M.D.   On: 08/28/2020 01:11    Procedures Procedures   Medications Ordered in ED Medications  sodium chloride flush (NS) 0.9 % injection 3 mL (has no administration in time range)  ondansetron (ZOFRAN) 4 MG/2ML injection (has no administration in time range)  levETIRAcetam (KEPPRA) 1000 MG/100ML IVPB (has no administration in time range)  LORazepam (ATIVAN) 2 MG/ML injection (has no administration in time range)  levETIRAcetam (KEPPRA) 1000 MG/100ML IVPB (has no administration in time range)  iohexol (OMNIPAQUE) 350 MG/ML injection 75 mL (75 mLs Intravenous Contrast Given 08/28/20 0023)    ED Course  I have reviewed the triage vital signs and the nursing notes.  Pertinent labs & imaging results that were available during my care of the patient were reviewed by me and considered in my medical decision making (see chart for details).    MDM Rules/Calculators/A&P                          patient presents the emergency department as a code stroke for left-sided weakness. She was evaluated by neurology on her ED presentation. During patients ED evaluation she did develop focal seizures with a rhythmic movement to the left lower extremity. She was treated with Ativan with termination of the activity and loaded with Keppra. Plan to admit for ongoing treatment and workup.  Final Clinical Impression(s) / ED Diagnoses Final diagnoses:  Focal seizures [facility])    Rx / DC Orders ED Discharge Orders     None        Tilden Fossa, MD 08/28/20 [phone]

## 2020-08-28 NOTE — Progress Notes (Signed)
OT Cancellation Note  Patient Details Name: Erica Duncan MRN: [id number] DOB: 07-Apr-1950   Cancelled Treatment:    Reason Eval/Treat Not Completed: Patient at procedure or test/ unavailable (EEG. Will attempt later time)  [facility] 08/28/2020, 11:10 AM Luisa Dago, OT/L   Acute OT Clinical Specialist Acute Rehabilitation Services Pager [phone] Office [phone]

## 2020-08-28 NOTE — ED Notes (Signed)
keppra not due now

## 2020-08-28 NOTE — ED Notes (Signed)
At 2am Pt's pressures were noted to be 83/49.  At 203 they were 80/53.  This RN placed pt in trendelenberg and attempted to notify EDP who was unavailable d/t pt. Care.  This RN consulted with CN who suggested fluid bolus. This RN began a 500cc NS bolus at 2:05 as a BP of 88/59 registered.  At 209 BP was 81/46.  AT this time Dr. Iver Nestle came by room.  She sternal rubbed pt and pt responded to pain.  She suggested trying other arm with BP cuff. BP did not improve.  Dr. Princess Bruins new cuff.  BP registered at 107/97.  Pt is still somnolent but arousable.

## 2020-08-28 NOTE — ED Notes (Signed)
Informed provider about pt temp. 99.4 rectal.

## 2020-08-28 NOTE — ED Notes (Signed)
Notified provider about pt BP. 

## 2020-08-28 NOTE — ED Notes (Addendum)
See down time for keppra times and notes

## 2020-08-28 NOTE — ED Notes (Signed)
Pt could not have a mri not co-operating

## 2020-08-28 NOTE — ED Notes (Signed)
Pt had a stroke swallow screen completed prior to shift and SLP has came in to complete swallow screen. Diet ordered puree diet with thin liquids.

## 2020-08-28 NOTE — Progress Notes (Signed)
Physical Therapy Evaluation Patient Details Name: Erica Duncan MRN: [id number] DOB: 1950/04/11 Today's Date: 08/28/2020   History of Present Illness  Pt is a 70yo female presenting to [facility] ED on 8/10 with acute-onset L weakness and confusion.  Pt was observed to have seizure like activity in ED.  MRI 8/11 with no acute findings. 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   Clinical Impression  Pt presents with the impairments above and problems listed below. Pt lethargic upon entry and despite repeated attempts by PT, OT, and daughter to wake would not maintain wakeful state. Bed placed in chair position to see if lethargy would abate. Pt was able to state her name and to respond to some simple verbal commands. Further mobility deferred. Pt's family available intermittently and may be able to provide 24/7 supervision but cannot confirm at this time. Current recommendation is SNF-level therapies upon discharge pending progression and family's level of availability. We will continue to follow her acutely to promote independence with functional mobility.    Follow Up Recommendations SNF (pending progression; pt may discharge to [facility] if progresses well)    Equipment Recommendations  Other (comment) (TBD pending mobility progression)    Recommendations for Other Services       Precautions / Restrictions Precautions Precautions: Fall Restrictions Weight Bearing Restrictions: No      Mobility  Bed Mobility Overal bed mobility: Needs Assistance             General bed mobility comments: Pt placed in chair position in bed (HOB >45 degrees) to see if pt's alertness would improve. Did note that pt would open eyes after repeated attempts. Was also able to state name and follow simple physical commands, but would quickly fall back asleep. Further mobility deferred secondary to lethargy.    Transfers                    Ambulation/Gait                 Stairs            Wheelchair Mobility    Modified Rankin (Stroke Patients Only)       Balance                                             Pertinent Vitals/Pain Pain Assessment: Faces Faces Pain Scale: No hurt    Home Living Family/patient expects to be discharged to:: Private residence Living Arrangements: Children Available Help at Discharge: Family (Pt's daughter available sometimes but unsure if 24/7 is possible) Type of Home: House Home Access: Stairs to enter Entrance Stairs-Rails: Right Entrance Stairs-Number of Steps: 4 Home Layout: One level Home Equipment: Cane - single point Additional Comments: Family remodeling the other bathroom to make it more accessible    Prior Function Level of Independence: Independent         Comments: since stroke has not driven; slower processing     Hand Dominance        Extremity/Trunk Assessment   Upper Extremity Assessment Upper Extremity Assessment: Defer to OT evaluation    Lower Extremity Assessment Lower Extremity Assessment: Generalized weakness;RLE deficits/detail;LLE deficits/detail;Difficult to assess due to impaired cognition (Full assessment not completed due to lethargy) RLE Deficits / Details: pt able to perform single straight leg raise. further attempts limited by lethargy. LLE  Deficits / Details: Pt unable to perform straight leg raise secondary to lethargy.    Cervical / Trunk Assessment Cervical / Trunk Assessment: Normal  Communication   Communication: Prefers language other than English (Spanish, daughter requested to translate)  Cognition Arousal/Alertness: Lethargic;Suspect due to medications Behavior During Therapy: Flat affect Overall Cognitive Status: Difficult to assess                                 General Comments: Pt very lethargic and generally unable to be roused. Pt was able to say her name and perform some basic physical movements, but  would quickly fall back to sleep.      General Comments General comments (skin integrity, edema, etc.): Pt lethargic and generally non-responsive to attempts to wake including sternal rub, cold washcloth, and speaking to her.    Exercises     Assessment/Plan    PT Assessment Patient needs continued PT services  PT Problem List Decreased strength;Decreased range of motion;Decreased activity tolerance;Decreased mobility;Decreased coordination;Decreased cognition;Decreased balance       PT Treatment Interventions DME instruction;Stair training;Gait training;Functional mobility training;Therapeutic activities;Therapeutic exercise;Balance training;Neuromuscular re-education;Cognitive remediation;Patient/family education    PT Goals (Current goals can be found in the Care Plan section)  Acute Rehab PT Goals Patient Stated Goal: to go home PT Goal Formulation: With family Time For Goal Achievement: 09/11/20 Potential to Achieve Goals: Good    Frequency Min 2X/week   Barriers to discharge        Co-evaluation PT/OT/SLP Co-Evaluation/Treatment: Yes Reason for Co-Treatment: Complexity of the patient's impairments (multi-system involvement) PT goals addressed during session: Mobility/safety with mobility         AM-PAC PT "6 Clicks" Mobility  Outcome Measure Help needed turning from your back to your side while in a flat bed without using bedrails?: A Lot Help needed moving from lying on your back to sitting on the side of a flat bed without using bedrails?: A Lot Help needed moving to and from a bed to a chair (including a wheelchair)?: A Lot Help needed standing up from a chair using your arms (e.g., wheelchair or bedside chair)?: A Lot Help needed to walk in hospital room?: A Lot Help needed climbing 3-5 steps with a railing? : Total 6 Click Score: 11    End of Session   Activity Tolerance: Patient limited by lethargy Patient left: in bed;with call bell/phone within  reach;with family/visitor present (on bed in ED) Nurse Communication: Mobility status PT Visit Diagnosis: Muscle weakness (generalized) (M62.81)    Time: 3151-7616 PT Time Calculation (min) (ACUTE ONLY): 26 min   Charges:   PT Evaluation $PT Eval Moderate Complexity: 1 Mod          Johnn Hai, SPT  Johnn Hai 08/28/2020, 2:26 PM

## 2020-08-28 NOTE — Progress Notes (Signed)
EEG completed, results pending. 

## 2020-08-28 NOTE — ED Notes (Signed)
Notified provider about if long acting insulin should still be given since pt has not been eating and CBG 93

## 2020-08-28 NOTE — Evaluation (Signed)
Clinical/Bedside Swallow Evaluation Patient Details  Name: Erica Duncan MRN: [id number] Date of Birth: September 25, 1950  Today's Date: 08/28/2020 Time: SLP Start Time (ACUTE ONLY): 1122 SLP Stop Time (ACUTE ONLY): 1135 SLP Time Calculation (min) (ACUTE ONLY): 13 min  Past Medical History:  Past Medical History:  Diagnosis Date   Chronic kidney disease, stage 3b (HCC) 08/28/2020   Essential hypertension 08/28/2020   Mixed diabetic hyperlipidemia associated with type 2 diabetes mellitus (HCC) 08/28/2020   Uncontrolled type 2 diabetes mellitus with hyperglycemia, with long-term current use of insulin (HCC) 08/28/2020   Past Surgical History: History reviewed. No pertinent surgical history. HPI:  [name] with who presented to [facility] emergency department after cute onset left-sided weakness and confusion.  Pt was observed to have seizure like activity in ED.  MRI 8/11 with no acute findings.  Pt has past medical history of right frontotemporal stroke (2021 in [place] hypertension, hyperlipidemia, insulin-dependent diabetes mellitus type 2.   Assessment / Plan / Recommendation Clinical Impression  Pt was seen with family interpreting SLP instructions into family for patient.  Family reports pt consumes regular diet at baseline without difficulty.  Pt presents with moderate oral dysphagia c/b L sided facial weakness, reduced oral acceptance, decreased mastication, and poor oral manipulation of bolus.  Pt was able to siphon liquid from straw with greater success than with cup sips.  With puree, pt accepted a small amount and appeared to acheive adequate oral clearance.  Pt did not accept regular solid cracker or simulated ground consistency.  Facial weakness may result in oral residue with more advance solids when she is able to accept them.  SLP to follow for diet tolerance management and reassessment when pt is more alert.    Recommend puree diet  with thin liquid. Crush medications if permissible and mix with puree.   SLP Visit Diagnosis: Dysphagia, oral phase (R13.11);Dysphagia, unspecified (R13.10)    Aspiration Risk  Moderate aspiration risk    Diet Recommendation Dysphagia 1 (Puree);Thin liquid   Liquid Administration via: Straw Medication Administration: Crushed with puree Supervision: Staff to assist with self feeding Compensations: Slow rate;Small sips/bites Postural Changes: Seated upright at 90 degrees    Other  Recommendations Oral Care Recommendations: Oral care BID   Follow up Recommendations  (TBD)      Frequency and Duration min 2x/week  2 weeks       Prognosis Prognosis for Safe Diet Advancement: Good      Swallow Study   General Date of Onset: Sep 11, 2020 HPI: [name] with who presented to [facility] emergency department after cute onset left-sided weakness and confusion.  Pt was observed to have seizure like activity in ED.  MRI 8/11 with no acute findings.  Pt has past medical history of right frontotemporal stroke (2021 in [place] hypertension, hyperlipidemia, insulin-dependent diabetes mellitus type 2. Type of Study: Bedside Swallow Evaluation Previous Swallow Assessment: None Diet Prior to this Study: NPO Temperature Spikes Noted: No Respiratory Status: Room air History of Recent Intubation: No Behavior/Cognition: Lethargic/Drowsy Oral Cavity Assessment: Within Functional Limits (limited visualization) Oral Care Completed by SLP: No Oral Cavity - Dentition: Adequate natural dentition Self-Feeding Abilities: Total assist Patient Positioning: Upright in bed Baseline Vocal Quality: Not observed Volitional Cough: Cognitively unable to elicit Volitional Swallow: Unable to elicit    Oral/Motor/Sensory Function Overall Oral Motor/Sensory Function: Moderate impairment (Could not assess) Facial ROM:  (Could not assess) Facial Symmetry: Abnormal symmetry  left Facial Strength:  (  Could not assess) Facial Sensation:  (Could not assess) Lingual ROM:  (Could not assess) Lingual Symmetry:  (Could not assess) Lingual Strength:  (Could not assess) Lingual Sensation:  (Could not assess) Velum:  (Could not assess) Mandible:  (Could not assess)   Ice Chips Ice chips: Not tested   Thin Liquid Thin Liquid: Within functional limits Presentation: Cup;Straw    Nectar Thick Nectar Thick Liquid: Not tested   Honey Thick Honey Thick Liquid: Not tested   Puree Puree: Impaired Presentation: Spoon Oral Phase Impairments:  (Poor acceptance of bolus)   Solid     Solid: Impaired Oral Phase Impairments: Poor awareness of bolus;Impaired mastication;Reduced lingual movement/coordination;Reduced labial seal      Kerrie Pleasure, MA, CCC-SLP Acute Rehabilitation Services Office: [phone] 08/28/2020,11:52 AM

## 2020-08-28 NOTE — ED Notes (Addendum)
Notified provider about pt pain and HR

## 2020-08-28 NOTE — ED Notes (Signed)
Pt returned to [facility] from Ct at approximately 0110 in the morning.  Downtime began immediately after.  Pt was complaining of nausea so I gave her Zofran per verbal order by Dr. Pecola Leisure at [phone].   It was determined that the pt's symptoms were likely d/t seizure activity. Pt was observed having left side focal seizures with ability to speak throughout.  Dr. Iver Nestle ordered 2mg  of Ativan that was administered at 0145.  Seizure activity ceased.  Dr. Marland Kitchen gave verbal order for 2000 mg of Keppra.  1G began at 0128, 1G began at 0150.  Both were in by 02:05.    Dr. Iver Nestle asked for another 1600 mg Keppra but determined it could wait d/t cease in seizure activity post-Ativan. Dr. Iver Nestle instructed the remaining 1600 could be given over the next 2 q12hr doses which she will states she will order.

## 2020-08-28 NOTE — Consult Note (Addendum)
Neurology Consultation Reason for Consult: Code stroke Requesting Physician: Tilden Fossa  CC: Left sided weakness and confusion   History is obtained from: Patient, family at bedside   HPI: Erica Duncan is a 70 y.o. Spanish-speaking female with a past medical history significant for right temporoparietal stroke approximately 18 months ago (in [place] diabetes, hypertension, hyperlipidemia.  Family reports that they just returned from a 5-day stay in [place] this morning and that this stay was very stressful for the patient.  Subsequently she was sleeping a lot during during her time there.  She additionally has had a few weeks of leg swelling for which they believe HCTZ was started.  Initial last known well was reported as 22:30 but on further questioning the patient was already confused at that time and her true last known well was more likely around 19:30.  Family deny any prior history of seizures and did not report any weakness before today.  However after focal seizure was witnessed in the ED family reported that she did have a left head turn and left gaze just before they called EMS.  Symptoms were fluctuating with EMS and in the ED, with notable plegia on the left side and intermittent left gaze deviation.  Family did report that the patient had been nauseated since the morning and had some emesis in the airplane which they report is fairly atypical for her as she normally travels well.  Regarding her prior stroke, they report that she had had some confusion at the time and difficulty doing simple tasks such as tying her shoes.  Her speech was abnormal and slurred but she did not have any focal weakness.  On further questioning they do report some vision loss on the left side but deny any prior weakness even at the time of the stroke.  They report she has recovered well from this overall, and although she does not drive she dresses independently, takes care of her IADLs, and fills  her own pillboxes, although the family does check behind her to confirm she is taking her medications as prescribed.  Prior to moving in with her daughter after her stroke, her comorbidities were not well controlled but daughter reports she is much better about taking her medications now.  She has no known history of seizures.  Please note nurse was additionally concerned about following blood pressure at  one point but this was found to be likely artifactual given resolution with changing blood pressure cuff.  Nurse did administer 250 cc of normal saline prior to changing blood pressure cuff.  LKW: 7 PM  tPA given?: No, due to out of the window  Premorbid modified rankin scale:     2 - Slight disability. Able to look after own affairs without assistance, but unable to carry out all previous activities.   ROS: Limited to language barrier and patient confusion  No past medical history on file. See above  No family history on file. Negative family history for seizures  Social History:  has no history on file for tobacco use, alcohol use, and drug use. Family denies any substance use  Exam: Current vital signs: BP 116/76 (BP Location: Right Arm)   Pulse (!) 107   Resp 16   Wt 60.8 kg   SpO2 96%  Vital signs in last 24 hours: Pulse Rate:  [107] 107 (08/11 0042) Resp:  [16] 16 (08/11 0042) BP: (116)/(76) 116/76 (08/11 0042) SpO2:  [96 %] 96 % (08/11 0042) Weight:  [  60.8 kg] 60.8 kg (08/11 0010)   Physical Exam  Constitutional: Appears well-developed and well-nourished.  Psych: Intermittently confused but calm and cooperative Eyes: No scleral injection HENT: No oropharyngeal obstruction.  MSK: no joint deformities.  Cardiovascular: Normal rate and regular rhythm.  Respiratory: Effort normal, non-labored breathing GI: Soft.  No distension. There is no tenderness.  Skin: Warm dry and intact visible skin, she does have cysts bilateral pitting edema left greater than right leg and  left arm  Neuro: Mental Status: Patient is awake, alert, oriented to person, place, month, year, and situation., intermittently she does not answer the month correctly Patient is able to give limited history Partial neglect of left side Cranial Nerves: II: Visual Fields are full at best, intermittently she does not reliably blink to threat. Pupils are equal, round, and reactive to light.   III,IV, VI: EOM without ptosis or diploplia, on initial evaluation intermittent left gaze preference.  After Ativan given for clear focal motor seizure, slight right gaze preference.  V: Facial sensation is symmetric to eyelash brush VII: Facial movement is symmetric.  VIII: hearing is intact to voice X: Uvula elevates symmetrically XI: Shoulder shrug is symmetric. XII: tongue is midline without atrophy or fasciculations.  sensory/motor: Tone is normal. Bulk is normal.  Intermittently her strength waxes and wanes on the left side, possibly secondary more to neglect than true weakness.  At best she has slight drift of the left upper extremity and has movement of the left lower extremity without being able to bring it antigravity at all.  She is freely moving the right arm and leg grossly normal.  She is far less responsive to sensory stimuli on the left side compared to right. On later evaluation she has rhythmic jerking of the left lower extremity, intermittent left head turning and left gaze deviation, followed by right gaze deviation after Ativan administration and cessation of the shaking activity.  She did not lose consciousness during the shaking activity Deep Tendon Reflexes: Reflexes are increased on the left compared to the right (3+ versus 2+) Plantars: Toes are downgoing bilaterally.  Cerebellar: FNF and HKS are intact bilaterally within limits of weakness  NIHSS total: 10 One-point for intermittent left gaze preference, 2 points for left arm drift, 3 points for left leg drift, one-point for  sensory loss on the left, one-point for aphasia, one-point for dysarthria, one-point for extinction/inattention to the left  I have reviewed labs in epic and the results pertinent to this consultation are: Glucose 343, mild hyponatremia to 133 will correct to normal when taking into account glucose CBC notable for macrocytosis without anemia, elevated RDW at 17.1 Creatinine 1.74 GFR 31  INR 1.3, PT 15.9  I have reviewed the images obtained: Head CT without acute intracranial process CTA without LVO, though notable for bilateral pleural effusions right greater than left G  Impression: 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 CKD, per primary team -Expect gradual improvement -Patient will need outpatient follow-up with neurology, as well as reiteration of standard seizure precautions as below.  At this time family reports patient is not driving  -Inpatient seizure precautions -Neurology will follow  Standard seizure precautions: Per [facility] statutes, patients with seizures are not allowed to drive until  they have been seizure-free for six months. Use caution when using heavy equipment or power tools. Avoid working on ladders or at heights. Take showers instead of baths. Ensure the water temperature is not too high on the home water heater. Do not go swimming alone. When caring for infants or small children, sit down when holding, feeding, or changing them to minimize risk of injury to the child in the event you have a seizure.  To reduce risk of seizures, maintain good sleep  hygiene avoid alcohol and illicit drug use, take all anti-seizure medications as prescribed.    Brooke Dare MD-PhD [facility] [phone] Available 7 PM to 7 AM, outside of these hours please call Neurologist on call as listed on Amion.    Total critical care time: 80 minutes, due to language barrier significantly   Critical care time was exclusive of separately billable procedures and treating other patients.   Critical care was necessary to treat or prevent imminent or life-threatening deterioration; focal motor status with potential progression to generalized tonic-clonic seizures and acute neurological changes were evaluated.   Critical care was time spent personally by me on the following activities: development of treatment plan with patient and/or surrogate as well as nursing, discussions with consultants/primary team, evaluation of patient's response to treatment, examination of patient, obtaining history from patient or surrogate, ordering and performing treatments and interventions, ordering and review of laboratory studies, ordering and review of radiographic studies, and re-evaluation of patient's condition as needed, as documented above.

## 2020-08-28 NOTE — ED Notes (Signed)
Pt arrives to room 40

## 2020-08-28 NOTE — Progress Notes (Signed)
Inpatient Diabetes Program Recommendations  AACE/ADA: New Consensus Statement on Inpatient Glycemic Control (2015)  Target Ranges:  Prepandial:   less than 140 mg/dL      Peak postprandial:   less than 180 mg/dL (1-2 hours)      Critically ill patients:  140 - 180 mg/dL   Lab Results  Component Value Date   GLUCAP 269 (H) 08/28/2020   HGBA1C 7.9 (H) 08/28/2020    Review of Glycemic Control Results for Erica Duncan, Erica Duncan (MRN [id number]) as of 08/28/2020 13:36  Ref. Range 08/28/2020 00:01 08/28/2020 08:03 08/28/2020 11:45  Glucose-Capillary Latest Ref Range: 70 - 99 mg/dL 846 (H) 659 (H) Novolog 11 units 269 (H) Novolog 8 units   Diabetes history: DM2 Outpatient Diabetes medications: Lantus 30 units,Amaryl 4 mg bid,Janumet 50-500 qd Current orders for Inpatient glycemic control: Semglee 30 units, 0-15 units qid  Inpatient Diabetes Program Recommendations:   Consider: -Decrease Semglee to 15 units qhs (50% home insulin dose) -Decrease Novolog correction hs to 0-5 units Secure chat sent to Dr. Hanley Ben.  Thank you, Billy Fischer. Leara Rawl, RN, MSN, CDE  Diabetes Coordinator Inpatient Glycemic Control Team Team Pager [phone] (8am-5pm) 08/28/2020 1:36 PM

## 2020-08-28 NOTE — ED Notes (Signed)
The pt returned from mri appaerntly stayed there until she  Had a mri admitting doctor at the bedside

## 2020-08-28 NOTE — ED Notes (Signed)
Pt to mri still asleep not   ansering questions

## 2020-08-28 NOTE — Procedures (Signed)
Patient Name: Erica Duncan  MRN: [id number]  Epilepsy Attending: Charlsie Quest  Referring Physician/Provider: Dr Brooke Dare Date: 08/28/2020 Duration: 23.45 mins  Patient history: 70 year old female presented with left sided weakness and confusion.  EEG evaluate for seizures.  Level of alertness: Awake, asleep  AEDs during EEG study: LEV  Technical aspects: This EEG study was done with scalp electrodes positioned according to the 10-20 International system of electrode placement. Electrical activity was acquired at a sampling rate of 500Hz  and reviewed with a high frequency filter of 70Hz  and a low frequency filter of 1Hz . EEG data were recorded continuously and digitally stored.   Description: No clear posterior dominant rhythm was seen.Sleep was characterized by vertex waves, sleep spindles (12 to 14 Hz), maximal frontocentral region. EEG showed continuous generalized and lateralized right hemisphere 3 to 5 Hz theta and delta slowing.  Sharp waves were noted in left frontal region. Hyperventilation and photic stimulation were not performed.     ABNORMALITY -Sharp wave, left frontal region  -Continuous slow, generalized and lateralized right hemisphere  IMPRESSION: This study showed evidence of epileptogenicity arising from left frontal region. Additionally there is evidence of cortical dysfunction in right hemisphere likely secondary to underlying structural abnormality/stroke. There is also moderate diffuse encephalopathy, nonspecific to etiology. No seizures were seen throughout the recording.  Erica Duncan 

## 2020-08-28 NOTE — H&P (Signed)
History and Physical    Erica Duncan LYH:[id number] DOB: Jun 07, 1950 DOA: 08/19/2020  PCP: [facility]  Patient coming from: Home via EMS   Chief Complaint: Left sided weakness, confusion    HPI:    [name] with past medical history of right frontotemporal stroke (2021 in [place] hypertension, hyperlipidemia, insulin-dependent diabetes mellitus type 2 who presents to [facility] emergency department after cute onset left-sided weakness and confusion.  Patient is extremely lethargic at this point is unable to provide history.  Majority of the history is been obtained from the daughter who is at the bedside  Daughter explains that the family had just arrived from a trip to [place] earlier in the evening on 8/10.  Shortly after arrival, the patient stated that she was not feeling well and wanted to go to bed.    The daughter went to check on her mother at approximately 10:30 PM and noted that she was visibly confused, "pulling out all the toilet paper in the bathroom."  Over the next several minutes, she began to experience progressively worsening left-sided weakness with associated nonsensical speech.  Daughter denies any associated facial droop.  Originally daughter was intending to bring the patient to the hospital herself but due to the patient's rapidly progressive presentation EMS was contacted.    EMS promptly came to evaluate the patient, initiated a code stroke and brought the patient into [facility] emergency department for evaluation.    Upon evaluation in the emergency department patient underwent stat CT imaging of the head which revealed chronic MCA distribution infarct.  Patient was promptly evaluated by Dr. Iver Nestle with neurology.  It was observed that the patient was exhibiting rhythmic movements of the left leg and left arm concerning for focal seizure activity.  Patient was administered 2 mg of  intravenous Ativan followed by 2 g intravenous Keppra load.  Seizure activity abated.  The hospitalist group was then called to assess the patient for admission the hospital.  Review of Systems:   Review of Systems  Unable to perform ROS: Mental status change   Past Medical History:  Diagnosis Date   Chronic kidney disease, stage 3b (HCC) 08/28/2020   Essential hypertension 08/28/2020   Mixed diabetic hyperlipidemia associated with type 2 diabetes mellitus (HCC) 08/28/2020   Uncontrolled type 2 diabetes mellitus with hyperglycemia, with long-term current use of insulin (HCC) 08/28/2020    History reviewed. No pertinent surgical history.   reports that she has never smoked. She has never used smokeless tobacco. No history on file for alcohol use and drug use.  No Known Allergies  Family History  Problem Relation Age of Onset   Stroke Neg Hx      Prior to Admission medications   Medication Sig Start Date End Date Taking? Authorizing Provider  aspirin EC 81 MG tablet Take 81 mg by mouth daily. Swallow whole.   Yes [provider]  glimepiride (AMARYL) 4 MG tablet Take 4 mg by mouth 2 (two) times daily. 07/31/20  Yes [provider]  hydrochlorothiazide (HYDRODIURIL) 25 MG tablet Take 25 mg by mouth daily. 07/31/20  Yes [provider]  JANUMET XR 50-500 MG TB24 Take 1 tablet by mouth daily. 07/31/20  Yes [provider]  LANTUS SOLOSTAR 100 UNIT/ML Solostar Pen Inject 30 Units into the skin at bedtime. 07/31/20  Yes [provider]  lisinopril (ZESTRIL) 40 MG tablet Take 40 mg by mouth daily. 07/31/20  Yes [provider]  Multiple Vitamin (MULTIVITAMIN WITH MINERALS) TABS tablet Take 1 tablet by mouth daily.   Yes [provider]  rosuvastatin (CRESTOR) 10 MG tablet Take 10 mg by mouth daily. 07/31/20  Yes [provider]    Physical Exam: Vitals:   08/28/20 0245 08/28/20 0300 08/28/20 0315 08/28/20 0400  BP: 99/67  98/69 105/75 109/68  Pulse: 93 95  100  Resp: (!) 21 (!) 21 20 (!) 28  SpO2: 100% 100%  100%  Weight:        Constitutional: Lethargic but arousable, minimally verbal, patient is currently not in any acute distress. Skin: no rashes, no lesions, good skin turgor noted. Eyes: Pupils are equally reactive to light.  No evidence of scleral icterus or conjunctival pallor.  ENMT: Moist mucous membranes noted.  Posterior pharynx clear of any exudate or lesions.   Neck: normal, supple, no masses, no thyromegaly.  No evidence of jugular venous distension.   Respiratory: clear to auscultation bilaterally, no wheezing, no crackles. Normal respiratory effort. No accessory muscle use.  Cardiovascular: Regular rate and rhythm, no murmurs / rubs / gallops. No extremity edema. 2+ pedal pulses. No carotid bruits.  Chest:   Nontender without crepitus or deformity.   Back:   Nontender without crepitus or deformity. Abdomen: Abdomen is soft and nontender.  No evidence of intra-abdominal masses.  Positive bowel sounds noted in all quadrants.   Musculoskeletal: No joint deformity upper and lower extremities. Good ROM, no contractures. Normal muscle tone.  Neurologic: Patient is able to move all 4 extremities spontaneously.  Patient is not consistently following commands.  Sensation is grossly intact.   Psychiatric: Unable to assess due to significant lethargy.  Patient currently does not seem to possess insight as to her current situation.    Labs on Admission: I have personally reviewed following labs and imaging studies -   CBC: Recent Labs  Lab 08/28/20 0001 08/28/20 0012  WBC 6.4  --   NEUTROABS 4.6  --   HGB 12.5 15.0  HCT 41.6 44.0  MCV 73.4*  --   PLT 165  --    Basic Metabolic Panel: Recent Labs  Lab 08/28/20 0001 08/28/20 0012  NA 133* 135  K 4.7 4.7  CL 98 100  CO2 21*  --   GLUCOSE 339* 343*  BUN 21 22  CREATININE 1.74* 1.60*  CALCIUM 9.9  --    GFR: CrCl cannot be calculated  (Unknown ideal weight.). Liver Function Tests: Recent Labs  Lab 08/28/20 0001  AST 32  ALT 19  ALKPHOS 66  BILITOT 1.3*  PROT 7.2  ALBUMIN 3.5   No results for input(s): LIPASE, AMYLASE in the last 168 hours. No results for input(s): AMMONIA in the last 168 hours. Coagulation Profile: Recent Labs  Lab 08/28/20 0001  INR 1.3*   Cardiac Enzymes: No results for input(s): CKTOTAL, CKMB, CKMBINDEX, TROPONINI in the last 168 hours. BNP (last 3 results) No results for input(s): PROBNP in the last 8760 hours. HbA1C: No results for input(s): HGBA1C in the last 72 hours. CBG: Recent Labs  Lab 08/28/20 0001  GLUCAP 324*   Lipid Profile: No results for input(s): CHOL, HDL, LDLCALC, TRIG, CHOLHDL, LDLDIRECT in the last 72 hours. Thyroid Function Tests: No results for input(s): TSH, T4TOTAL, FREET4, T3FREE, THYROIDAB in the last 72 hours. Anemia Panel: No results for input(s): VITAMINB12, FOLATE, FERRITIN, TIBC, IRON, RETICCTPCT in the last 72 hours. Urine analysis: No results found for: COLORURINE, APPEARANCEUR, LABSPEC, PHURINE,  GLUCOSEU, HGBUR, BILIRUBINUR, KETONESUR, PROTEINUR, UROBILINOGEN, NITRITE, LEUKOCYTESUR  Radiological Exams on Admission - Personally Reviewed: CT CEREBRAL PERFUSION W CONTRAST  Result Date: 08/28/2020 CLINICAL DATA:  Initial evaluation for acute stroke. EXAM: CT ANGIOGRAPHY HEAD AND NECK TECHNIQUE: Multidetector CT imaging of the head and neck was performed using the standard protocol during bolus administration of intravenous contrast. Multiplanar CT image reconstructions and MIPs were obtained to evaluate the vascular anatomy. Carotid stenosis measurements (when applicable) are obtained utilizing NASCET criteria, using the distal internal carotid diameter as the denominator. CONTRAST:  75mL OMNIPAQUE IOHEXOL 350 MG/ML SOLN COMPARISON:  Prior head CT from earlier the same day. FINDINGS: CTA NECK FINDINGS Aortic arch: Visualized aortic arch normal in caliber  with normal branch pattern. Mild atheromatous change about the arch itself. No hemodynamically significant stenosis about the origin the great vessels. Right carotid system: Right common and internal carotid arteries widely patent without stenosis, dissection or occlusion. Left carotid system: Left common and internal carotid arteries widely patent without stenosis, dissection or occlusion. Vertebral arteries: Both vertebral arteries arise from the subclavian arteries. No proximal subclavian artery stenosis. Both vertebral arteries widely patent without stenosis, dissection or occlusion. Skeleton: No visible acute osseous finding. No discrete or worrisome osseous lesions. Patient is edentulous. Other neck: No other acute soft tissue abnormality within the neck. No mass or adenopathy. Upper chest: Moderate right greater than left layering pleural effusions, partially visualized. Visualized upper chest demonstrates no other acute abnormality. Review of the MIP images confirms the above findings CTA HEAD FINDINGS Anterior circulation: Petrous segments widely patent. Mild atheromatous change within the carotid siphons without significant stenosis. A1 segments patent bilaterally. Left A1 duplicated, with duplication of the left M1 segment. Normal anterior communicating artery complex. Anterior cerebral arteries patent to their distal aspects without stenosis. No M1 stenosis or occlusion. Normal MCA bifurcations. Distal MCA branches well perfused and symmetric. Posterior circulation: Both V4 segments patent to the vertebrobasilar junction without stenosis. Right PICA origin patent and normal. Left PICA not definitely seen. Basilar patent to its distal aspect without stenosis. Superior cerebellar arteries patent bilaterally. Both PCAs primarily supplied via the basilar well perfused to their distal aspects. Venous sinuses: Grossly patent allowing for timing the contrast bolus. Anatomic variants: None significant.  No  aneurysm. Review of the MIP images confirms the above findings IMPRESSION: 1. Negative CTA for emergent large vessel occlusion. 2. Mild atheromatous change about the carotid siphons without hemodynamically significant or correctable stenosis. 3. Moderate right greater than left layering pleural effusions, partially visualized. Results discussed by telephone on 08/28/2020 at approximately 12:50 am with provider Brooke DareSRISHTI BHAGAT , who verbally acknowledged these results. Electronically Signed   By: Rise MuBenjamin  McClintock M.D.   On: 08/28/2020 01:11   CT HEAD CODE STROKE WO CONTRAST  Result Date: 08/28/2020 CLINICAL DATA:  Code stroke. Initial evaluation for acute left-sided weakness. EXAM: CT HEAD WITHOUT CONTRAST TECHNIQUE: Contiguous axial images were obtained from the base of the skull through the vertex without intravenous contrast. COMPARISON:  None available. FINDINGS: Brain: Age-related cerebral atrophy with chronic microvascular ischemic disease. Encephalomalacia involving the right insula and posterior right temporoparietal region consistent with a chronic right MCA distribution infarct. No acute intracranial hemorrhage. No visible acute large vessel territory infarct. No mass lesion or midline shift. No hydrocephalus or extra-axial fluid collection. Vascular: No hyperdense vessel. Scattered vascular calcifications noted within the carotid siphons. Skull: Scalp soft tissues and calvarium within normal limits. Sinuses/Orbits: Globes and orbital soft tissues within normal limits. Paranasal sinuses  are clear. No mastoid effusion. Other: None. ASPECTS [facility] Stroke Program Early CT Score) - Ganglionic level infarction (caudate, lentiform nuclei, internal capsule, insula, M1-M3 cortex): 7 - Supraganglionic infarction (M4-M6 cortex): 3 Total score (0-10 with 10 being normal): 10 IMPRESSION: 1. No acute intracranial infarct or other abnormality. 2. ASPECTS is 10. 3. Chronic right MCA distribution infarct involving  the posterior right temporoparietal region. 4. Underlying atrophy with moderate chronic microvascular ischemic disease. These results were communicated to Dr. Iver Nestle at 12:24 am on 08/28/2020 by text page via the [facility] messaging system. Electronically Signed   By: Rise Mu M.D.   On: 08/28/2020 00:26   CT ANGIO HEAD NECK W WO CM (CODE STROKE)  Result Date: 08/28/2020 CLINICAL DATA:  Initial evaluation for acute stroke. EXAM: CT ANGIOGRAPHY HEAD AND NECK TECHNIQUE: Multidetector CT imaging of the head and neck was performed using the standard protocol during bolus administration of intravenous contrast. Multiplanar CT image reconstructions and MIPs were obtained to evaluate the vascular anatomy. Carotid stenosis measurements (when applicable) are obtained utilizing NASCET criteria, using the distal internal carotid diameter as the denominator. CONTRAST:  12mL OMNIPAQUE IOHEXOL 350 MG/ML SOLN COMPARISON:  Prior head CT from earlier the same day. FINDINGS: CTA NECK FINDINGS Aortic arch: Visualized aortic arch normal in caliber with normal branch pattern. Mild atheromatous change about the arch itself. No hemodynamically significant stenosis about the origin the great vessels. Right carotid system: Right common and internal carotid arteries widely patent without stenosis, dissection or occlusion. Left carotid system: Left common and internal carotid arteries widely patent without stenosis, dissection or occlusion. Vertebral arteries: Both vertebral arteries arise from the subclavian arteries. No proximal subclavian artery stenosis. Both vertebral arteries widely patent without stenosis, dissection or occlusion. Skeleton: No visible acute osseous finding. No discrete or worrisome osseous lesions. Patient is edentulous. Other neck: No other acute soft tissue abnormality within the neck. No mass or adenopathy. Upper chest: Moderate right greater than left layering pleural effusions, partially visualized.  Visualized upper chest demonstrates no other acute abnormality. Review of the MIP images confirms the above findings CTA HEAD FINDINGS Anterior circulation: Petrous segments widely patent. Mild atheromatous change within the carotid siphons without significant stenosis. A1 segments patent bilaterally. Left A1 duplicated, with duplication of the left M1 segment. Normal anterior communicating artery complex. Anterior cerebral arteries patent to their distal aspects without stenosis. No M1 stenosis or occlusion. Normal MCA bifurcations. Distal MCA branches well perfused and symmetric. Posterior circulation: Both V4 segments patent to the vertebrobasilar junction without stenosis. Right PICA origin patent and normal. Left PICA not definitely seen. Basilar patent to its distal aspect without stenosis. Superior cerebellar arteries patent bilaterally. Both PCAs primarily supplied via the basilar well perfused to their distal aspects. Venous sinuses: Grossly patent allowing for timing the contrast bolus. Anatomic variants: None significant.  No aneurysm. Review of the MIP images confirms the above findings IMPRESSION: 1. Negative CTA for emergent large vessel occlusion. 2. Mild atheromatous change about the carotid siphons without hemodynamically significant or correctable stenosis. 3. Moderate right greater than left layering pleural effusions, partially visualized. Results discussed by telephone on 08/28/2020 at approximately 12:50 am with provider Brooke Dare , who verbally acknowledged these results. Electronically Signed   By: Rise Mu M.D.   On: 08/28/2020 01:11    EKG: Personally reviewed.  Rhythm is sinus tachycardia with heart rate of 102 bpm.  No dynamic ST segment changes appreciated.  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 Seizure-like activity abated with administration of Ativan. This is been followed up with loading of 2 g of intravenous Keppra followed by the recommended dosing of 500 mg every 12. MRI brain with and without contrast per neurology recommendations EEG later today per neurology recommendation Neurology to continue to follow Monitoring electrolytes Seizure precautions Monitoring patient on telemetry  Active Problems:   Mixed diabetic hyperlipidemia associated with type 2 diabetes mellitus (HCC) Continue home regimen of Crestor    Essential hypertension  Currently holding home regimen of antihypertensives until stroke is definitively ruled out via MRI.    Chronic kidney disease, stage 3b (HCC)  Patient likely possesses chronic kidney disease and not acute kidney injury Strict input and output monitoring Monitoring renal function and electrolytes with serial chemistries Avoiding nephrotoxic agents if at all possible    Uncontrolled type 2 diabetes mellitus with hyperglycemia, with long-term current use of insulin (HCC)  Patient exhibiting substantial hyperglycemia Likely due to the fact the patient did not take her basal insulin yesterday Resuming basal insulin nightly Accu-Cheks before every meal and nightly with sliding scale insulin Hemoglobin A1c pending   Code Status:  Full code Family Communication: Daughter is at the bedside who has been updated on plan of care  Status is: Observation  The patient remains OBS appropriate and will d/c before 2 midnights.  Dispo: The patient is from: Home              Anticipated d/c is to: Home              Patient currently is not medically stable to d/c.   Difficult to place patient No        Marinda Elk MD [facility] Pager [phone]  If 7PM-7AM, please contact night-coverage [email or web address] Use universal [place] password for that web site. If you do not have the password,  please call the hospital operator.  08/28/2020, 6:49 AM

## 2020-08-28 NOTE — ED Notes (Signed)
Unable to complete a nih  The pt is too medicated  From iv ativan

## 2020-08-28 NOTE — Progress Notes (Signed)
PT Cancellation Note  Patient Details Name: Erica Duncan MRN: [id number] DOB: 11-06-1950   Cancelled Treatment:    Reason Eval/Treat Not Completed: Patient at procedure or test/unavailable. Pt receiving EEG. Will follow-up as schedule allows.   Johnn Hai, SPT Johnn Hai 08/28/2020, 11:38 AM

## 2020-08-29 LAB — CBC WITH DIFFERENTIAL/PLATELET
Abs Immature Granulocytes: 0.04 10*3/uL (ref 0.00–0.07)
Basophils Absolute: 0 10*3/uL (ref 0.0–0.1)
Basophils Relative: 0 %
Eosinophils Absolute: 0 10*3/uL (ref 0.0–0.5)
Eosinophils Relative: 0 %
HCT: 37.4 % (ref 36.0–46.0)
Hemoglobin: 11.6 g/dL — ABNORMAL LOW (ref 12.0–15.0)
Immature Granulocytes: 0 %
Lymphocytes Relative: 9 %
Lymphs Abs: 0.8 10*3/uL (ref 0.7–4.0)
MCH: 22.4 pg — ABNORMAL LOW (ref 26.0–34.0)
MCHC: 31 g/dL (ref 30.0–36.0)
MCV: 72.2 fL — ABNORMAL LOW (ref 80.0–100.0)
Monocytes Absolute: 0.7 10*3/uL (ref 0.1–1.0)
Monocytes Relative: 7 %
Neutro Abs: 7.4 10*3/uL (ref 1.7–7.7)
Neutrophils Relative %: 84 %
Platelets: 115 10*3/uL — ABNORMAL LOW (ref 150–400)
RBC: 5.18 MIL/uL — ABNORMAL HIGH (ref 3.87–5.11)
RDW: 16.9 % — ABNORMAL HIGH (ref 11.5–15.5)
WBC: 8.9 10*3/uL (ref 4.0–10.5)
nRBC: 0 % (ref 0.0–0.2)

## 2020-08-29 LAB — COMPREHENSIVE METABOLIC PANEL
ALT: 20 U/L (ref 0–44)
AST: 31 U/L (ref 15–41)
Albumin: 2.8 g/dL — ABNORMAL LOW (ref 3.5–5.0)
Alkaline Phosphatase: 49 U/L (ref 38–126)
Anion gap: 12 (ref 5–15)
BUN: 24 mg/dL — ABNORMAL HIGH (ref 8–23)
CO2: 21 mmol/L — ABNORMAL LOW (ref 22–32)
Calcium: 8.9 mg/dL (ref 8.9–10.3)
Chloride: 105 mmol/L (ref 98–111)
Creatinine, Ser: 1.66 mg/dL — ABNORMAL HIGH (ref 0.44–1.00)
GFR, Estimated: 33 mL/min — ABNORMAL LOW (ref >60)
Glucose, Bld: 123 mg/dL — ABNORMAL HIGH (ref 70–99)
Potassium: 4 mmol/L (ref 3.5–5.1)
Sodium: 138 mmol/L (ref 135–145)
Total Bilirubin: 1.3 mg/dL — ABNORMAL HIGH (ref 0.3–1.2)
Total Protein: 5.8 g/dL — ABNORMAL LOW (ref 6.5–8.1)

## 2020-08-29 LAB — GLUCOSE, CAPILLARY
Glucose-Capillary: 131 mg/dL — ABNORMAL HIGH (ref 70–99)
Glucose-Capillary: 159 mg/dL — ABNORMAL HIGH (ref 70–99)
Glucose-Capillary: 208 mg/dL — ABNORMAL HIGH (ref 70–99)
Glucose-Capillary: 218 mg/dL — ABNORMAL HIGH (ref 70–99)

## 2020-08-29 LAB — MAGNESIUM: Magnesium: 1.5 mg/dL — ABNORMAL LOW (ref 1.7–2.4)

## 2020-08-29 LAB — AMMONIA: Ammonia: 28 umol/L (ref 9–35)

## 2020-08-29 LAB — TSH: TSH: 0.36 u[IU]/mL (ref 0.350–4.500)

## 2020-08-29 MED ORDER — LEVETIRACETAM IN NACL 500 MG/100ML IV SOLN
500.0000 mg | Freq: Two times a day (BID) | INTRAVENOUS | Status: DC
  Administered 2020-08-29 – 2020-08-30 (×2): 500 mg via INTRAVENOUS
  Filled 2020-08-29: qty 100

## 2020-08-29 MED ORDER — OXYCODONE HCL 5 MG PO TABS
5.0000 mg | ORAL_TABLET | Freq: Four times a day (QID) | ORAL | Status: DC | PRN
Start: 2020-08-29 — End: 2020-09-01

## 2020-08-29 NOTE — Progress Notes (Signed)
Occupational Therapy Treatment Patient Details Name: Erica Duncan MRN: [id number] DOB: [birth year] Today's Date: 08/29/2020    History of present illness 70yo female presenting to [facility] ED on 8/10 with acute-onset L weakness and confusion.  Pt was observed to have seizure like activity in ED.  MRI 8/11 with no acute findings. 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   OT comments  Upon arrival, pt supine in bed and asleep with daughter at bed side. Pt presenting with increased arousal and activity tolerance. Pt performing bed mobility with Min A and then donning socks while seated EOB with Min A to initiate. Pt requiring Min A +2 for sit<>stand and side steps towards HOB. Pt nauseous while sitting at EOB but no vomiting and denies pain or dizziness. Returning to supine. Pt would benefit from further acute OT to facilitate safe dc. Pending pt progress, recommend dc to home with HHOT for further OT to optimize safety, independence with ADLs, and return to PLOF.    Follow Up Recommendations  Home health OT (Pending progress)    Equipment Recommendations  3 in 1 bedside commode (may not need)   Recommendations for Other Services      Precautions / Restrictions Precautions Precautions: Fall Precaution Comments: continuous EEG       Mobility Bed Mobility Overal bed mobility: Needs Assistance Bed Mobility: Supine to Sit;Sit to Supine     Supine to sit: Min assist;+2 for safety/equipment;HOB elevated Sit to supine: Min assist;+2 for safety/equipment;HOB elevated   General bed mobility comments: Min A to hold therapist's hand and pull into upright posture. Min A for bringing BELs over EOB in return to supine    Transfers Overall transfer level: Needs assistance Equipment used: 2 person hand held assist Transfers: Sit to/from Stand Sit to Stand: Min assist;+2 physical  assistance         General transfer comment: Min A +2 to initiate power up and gain balance in standing    Balance Overall balance assessment: Needs assistance Sitting-balance support: No upper extremity supported;Feet supported Sitting balance-Leahy Scale: Good     Standing balance support: Bilateral upper extremity supported;During functional activity Standing balance-Leahy Scale: Poor Standing balance comment: reliant on UE support and physical A                           ADL either performed or assessed with clinical judgement   ADL Overall ADL's : Needs assistance/impaired     Grooming: Wash/dry face;Min guard;Sitting               Lower Body Dressing: Minimal assistance;Sit to/from stand Lower Body Dressing Details (indicate cue type and reason): don socks with assistance to initiate over toes Toilet Transfer: Minimal assistance;+2 for physical assistance;+2 for safety/equipment           Functional mobility during ADLs: Minimal assistance;+2 for physical assistance General ADL Comments: Pt performing bed mobility at sit at EOB. Then donning socks with Min A. performing sit<>stand twice with side steps towards HOB. Becoming nauseous between sit<>stands but not vomiting. Denies dizziness or pain     Vision       Perception     Praxis      Cognition Arousal/Alertness: Awake/alert Behavior During Therapy: WFL for tasks assessed/performed Overall Cognitive Status: Impaired/Different from baseline Area of Impairment: Problem solving;Awareness  Awareness: Emergent Problem Solving: Slow processing;Requires verbal cues General Comments: Pt requiring increased time. Reports she is feeling much better but still not back to normal        Exercises     Shoulder Instructions       General Comments Daughter present throughout. VSS on RA throughout    Pertinent Vitals/ Pain       Pain Assessment: No/denies  pain  Home Living                                          Prior Functioning/Environment              Frequency  Min 2X/week        Progress Toward Goals  OT Goals(current goals can now be found in the care plan section)  Progress towards OT goals: Progressing toward goals  Acute Rehab OT Goals Patient Stated Goal: per family to get better and go home OT Goal Formulation: With family Time For Goal Achievement: 09/11/20 Potential to Achieve Goals: Good ADL Goals Pt Will Perform Upper Body Bathing: with supervision;sitting Pt Will Perform Lower Body Bathing: with supervision;sit to/from stand Pt Will Perform Lower Body Dressing: with supervision;sit to/from stand Pt Will Transfer to Toilet: with supervision;ambulating  Plan Discharge plan remains appropriate    Co-evaluation    PT/OT/SLP Co-Evaluation/Treatment: Yes Reason for Co-Treatment: For patient/therapist safety;To address functional/ADL transfers   OT goals addressed during session: ADL's and self-care      AM-PAC OT "6 Clicks" Daily Activity     Outcome Measure   Help from another person eating meals?: A Little Help from another person taking care of personal grooming?: A Little Help from another person toileting, which includes using toliet, bedpan, or urinal?: A Lot Help from another person bathing (including washing, rinsing, drying)?: A Lot Help from another person to put on and taking off regular upper body clothing?: A Little Help from another person to put on and taking off regular lower body clothing?: A Little 6 Click Score: 16    End of Session Equipment Utilized During Treatment: Gait belt  OT Visit Diagnosis: Other abnormalities of gait and mobility (R26.89);Muscle weakness (generalized) (M62.81);Other symptoms and signs involving cognitive function   Activity Tolerance Patient limited by lethargy   Patient Left in bed;with call bell/phone within reach;with bed alarm  set;with family/visitor present   Nurse Communication Other (comment) (level of arousal; BP)        Time: 1335-1403 OT Time Calculation (min): 28 min  Charges: OT General Charges $OT Visit: 1 Visit OT Treatments $Self Care/Home Management : 8-22 mins  Luis Sami MSOT, OTR/L Acute Rehab Pager: [phone] Office: [phone]   Theodoro Grist Krishav Mamone 08/29/2020, 4:13 PM

## 2020-08-29 NOTE — Progress Notes (Signed)
LTM discontinued; no skin breakdown was seen. [facility] notified. 

## 2020-08-29 NOTE — Progress Notes (Signed)
Pt admitted from ED with diagnosis of Seizures, pt alert, c/o of generalised pain with a rating of 9, family at bedside, pt on continuous EEG, Settled in room with call light within pt's reach, on call Doc paged and notified regarding pain medication, awaiting orders,tele monitor put and verified on pt, was however reassured and will continue to monitor, safety concern addressed accordingly, v/s stable. Obasogie-Asidi, Erica Duncan

## 2020-08-29 NOTE — Progress Notes (Signed)
Patient ID: Erica Duncan, female   DOB: 25-Jun-1950, 70 y.o.   MRN: [id number]  PROGRESS NOTE    Erica Duncan  HAL:[id number] DOB: 1950-12-20 DOA: 2020/09/23 PCP: [facility]   Brief Narrative:  70 year old female with history of right frontotemporal stroke in 2021, hypertension, hyperlipidemia, diabetes mellitus type 2 presented with acute onset of left-sided weakness and confusion.  Patient was extremely lethargic on presentation.  On presentation, CT of the head revealed chronic MCA distribution infarct.  Neurology was consulted.  She was exhibiting rhythmic movements of the left leg and left arm concerning for focal seizure activity and she was started on IV Ativan and Keppra.  Assessment & Plan:   Seizures, most likely due to underlying stroke History of right frontotemporal stroke in 2021 -Presented with acute onset of left-sided weakness and confusion and had focal movements of left upper and lower extremity concerning for seizure in the ED -CT of the head was negative for acute intracranial abnormality but showed chronic MCA distal infarct.  MRI of brain was negative for acute infarct although it was a motion degraded study.  CT cerebral perfusion with contrast was negative for emergent large vessel occlusion -EEG was negative for seizures.  Patient underwent LTM EEG.  Follow further neurology recommendations.  Currently on IV Keppra. -Still drowsy.  Seizure precautions.  Fall/aspiration precautions -Continue aspirin/statin -If mental status does not improve, patient might need LP  Essential hypertension -blood pressure on the lower side.  Antihypertensives on hold.  Diabetes mellitus type 2 with hyperglycemia and hypoglycemia -A1c 7.9.  Patient has had hyperglycemia and hypoglycemia.  Decrease long-acting insulin to 5 units at bedtime.  Continue CBGs with SSI.  Chronic kidney disease stage IIIb -Creatinine stable.  Monitor.  Continue IV  fluids till oral intake improves  Mild hyponatremia -Improved.  Monitor  Thrombocytopenia -Questionable cause.  Monitor  Hypomagnesemia -Respiratory labs   DVT prophylaxis: Lovenox Code Status: Full Family Communication: None at bedside Disposition Plan: Status is: Inpatient  Remains inpatient appropriate because:Inpatient level of care appropriate due to severity of illness  Dispo: The patient is from: Home              Anticipated d/c is to: Home              Patient currently is not medically stable to d/c.   Difficult to place patient No  Consultants: Neurology  Procedures: EEG/LTM EEG  Antimicrobials: None   Subjective: Patient seen and examined at bedside.  Sleepy, wakes up slightly, answers some basic questions but then falls back to sleep.  No overnight fever, vomiting, seizures reported.  Objective: Vitals:   08/29/20 0031 08/29/20 0201 08/29/20 0400 08/29/20 0731  BP: 104/76 105/77 101/76 (!) 91/55  Pulse: (!) 104 96 88 80  Resp: 19 20 14 20   Temp: 98.1 F (36.7 C)   97.8 F (36.6 C)  TempSrc: Oral   Oral  SpO2: 97% 93% 94% 96%  Weight:        Intake/Output Summary (Last 24 hours) at 08/29/2020 1059 Last data filed at 08/29/2020 0300 Gross per 24 hour  Intake 2237.5 ml  Output --  Net 2237.5 ml   Filed Weights   08/28/20 0010  Weight: 60.8 kg    Examination:  General exam: Appears calm and comfortable.  Looks chronically ill and deconditioned.  Currently on room air. Respiratory system: Bilateral decreased breath sounds at bases Cardiovascular system: S1 & S2 heard, Rate controlled Gastrointestinal system:  Abdomen is nondistended, soft and nontender. Normal bowel sounds heard. Extremities: No cyanosis, clubbing, edema  Central nervous system: Drowsy, wakes up slightly, answers some basic questions and then falls back to sleep.  No focal neurological deficits. Moving extremities Skin: No rashes, lesions or ulcers Psychiatry: Could not be  assessed because of mental status   Data Reviewed: I have personally reviewed following labs and imaging studies  CBC: Recent Labs  Lab 08/28/20 0001 08/28/20 0012 08/28/20 0801 08/29/20 0415  WBC 6.4  --  6.5 8.9  NEUTROABS 4.6  --   --  7.4  HGB 12.5 15.0 12.3 11.6*  HCT 41.6 44.0 38.8 37.4  MCV 73.4*  --  71.9* 72.2*  PLT 165  --  147* 115*   Basic Metabolic Panel: Recent Labs  Lab 08/28/20 0001 08/28/20 0012 08/28/20 0801 08/29/20 0415  NA 133* 135 134* 138  K 4.7 4.7 4.9 4.0  CL 98 100 98 105  CO2 21*  --  22 21*  GLUCOSE 339* 343* 354* 123*  BUN 21 22 21  24*  CREATININE 1.74* 1.60* 1.71* 1.66*  CALCIUM 9.9  --  9.7 8.9  MG  --   --   --  1.5*   GFR: CrCl cannot be calculated (Unknown ideal weight.). Liver Function Tests: Recent Labs  Lab 08/28/20 0001 08/28/20 0801 08/29/20 0415  AST 32 34 31  ALT 19 20 20   ALKPHOS 66 62 49  BILITOT 1.3* 1.4* 1.3*  PROT 7.2 6.7 5.8*  ALBUMIN 3.5 3.2* 2.8*   No results for input(s): LIPASE, AMYLASE in the last 168 hours. No results for input(s): AMMONIA in the last 168 hours. Coagulation Profile: Recent Labs  Lab 08/28/20 0001  INR 1.3*   Cardiac Enzymes: No results for input(s): CKTOTAL, CKMB, CKMBINDEX, TROPONINI in the last 168 hours. BNP (last 3 results) No results for input(s): PROBNP in the last 8760 hours. HbA1C: Recent Labs    08/28/20 0801  HGBA1C 7.9*   CBG: Recent Labs  Lab 08/28/20 0803 08/28/20 1145 08/28/20 1659 08/28/20 2211 08/29/20 0624  GLUCAP 314* 269* 121* 93 131*   Lipid Profile: Recent Labs    08/28/20 0801  CHOL 111  HDL 54  LDLCALC 40  TRIG 87  CHOLHDL 2.1   Thyroid Function Tests: No results for input(s): TSH, T4TOTAL, FREET4, T3FREE, THYROIDAB in the last 72 hours. Anemia Panel: No results for input(s): VITAMINB12, FOLATE, FERRITIN, TIBC, IRON, RETICCTPCT in the last 72 hours. Sepsis Labs: No results for input(s): PROCALCITON, LATICACIDVEN in the last 168  hours.  Recent Results (from the past 240 hour(s))  SARS CORONAVIRUS 2 (TAT 6-24 HRS) Nasopharyngeal Nasopharyngeal Swab     Status: None   Collection Time: 08/28/20 10:19 AM   Specimen: Nasopharyngeal Swab  Result Value Ref Range Status   SARS Coronavirus 2 NEGATIVE NEGATIVE Final    Comment: (NOTE) SARS-CoV-2 target nucleic acids are NOT DETECTED.  The SARS-CoV-2 RNA is generally detectable in upper and lower respiratory specimens during the acute phase of infection. Negative results do not preclude SARS-CoV-2 infection, do not rule out co-infections with other pathogens, and should not be used as the sole basis for treatment or other patient management decisions. Negative results must be combined with clinical observations, patient history, and epidemiological information. The expected result is Negative.  Fact Sheet for Patients: HairSlick.nohttps://www.fda.gov/media/138098/download  Fact Sheet for Healthcare Providers: quierodirigir.comhttps://www.fda.gov/media/138095/download  This test is not yet approved or cleared by the [place] FDA and  has been  authorized for detection and/or diagnosis of SARS-CoV-2 by FDA under an Emergency Use Authorization (EUA). This EUA will remain  in effect (meaning this test can be used) for the duration of the COVID-19 declaration under Se ction 564(b)(1) of the Act, 21 U.S.C. section 360bbb-3(b)(1), unless the authorization is terminated or revoked sooner.  Performed at [facility], [address] [place], [place] [address]          Radiology Studies: MR BRAIN WO CONTRAST  Result Date: 08/28/2020 CLINICAL DATA:  Seizure.  Left-sided weakness and confusion EXAM: MRI HEAD WITHOUT CONTRAST TECHNIQUE: Multiplanar, multiecho pulse sequences of the brain and surrounding structures were obtained without intravenous contrast. COMPARISON:  CT head 08/28/2020 FINDINGS: Brain: Motion degraded study. Patient not able to complete the study. No contrast  administered. Negative for acute infarct. Chronic ischemic changes in the white matter bilaterally. Chronic cortical infarct right temporoparietal lobe with chronic hemorrhage. Chronic infarct in the left frontal convexity. Chronic microhemorrhage right cerebellum. Negative for mass lesion. Vascular: Normal arterial flow voids Skull and upper cervical spine: Negative Sinuses/Orbits: Negative Other: None IMPRESSION: Motion degraded study.  The study was terminated prematurely Negative for acute infarct. Electronically Signed   By: Marlan Palau M.D.   On: 08/28/2020 07:09   CT CEREBRAL PERFUSION W CONTRAST  Result Date: 08/28/2020 CLINICAL DATA:  Initial evaluation for acute stroke. EXAM: CT ANGIOGRAPHY HEAD AND NECK TECHNIQUE: Multidetector CT imaging of the head and neck was performed using the standard protocol during bolus administration of intravenous contrast. Multiplanar CT image reconstructions and MIPs were obtained to evaluate the vascular anatomy. Carotid stenosis measurements (when applicable) are obtained utilizing NASCET criteria, using the distal internal carotid diameter as the denominator. CONTRAST:  4mL OMNIPAQUE IOHEXOL 350 MG/ML SOLN COMPARISON:  Prior head CT from earlier the same day. FINDINGS: CTA NECK FINDINGS Aortic arch: Visualized aortic arch normal in caliber with normal branch pattern. Mild atheromatous change about the arch itself. No hemodynamically significant stenosis about the origin the great vessels. Right carotid system: Right common and internal carotid arteries widely patent without stenosis, dissection or occlusion. Left carotid system: Left common and internal carotid arteries widely patent without stenosis, dissection or occlusion. Vertebral arteries: Both vertebral arteries arise from the subclavian arteries. No proximal subclavian artery stenosis. Both vertebral arteries widely patent without stenosis, dissection or occlusion. Skeleton: No visible acute osseous  finding. No discrete or worrisome osseous lesions. Patient is edentulous. Other neck: No other acute soft tissue abnormality within the neck. No mass or adenopathy. Upper chest: Moderate right greater than left layering pleural effusions, partially visualized. Visualized upper chest demonstrates no other acute abnormality. Review of the MIP images confirms the above findings CTA HEAD FINDINGS Anterior circulation: Petrous segments widely patent. Mild atheromatous change within the carotid siphons without significant stenosis. A1 segments patent bilaterally. Left A1 duplicated, with duplication of the left M1 segment. Normal anterior communicating artery complex. Anterior cerebral arteries patent to their distal aspects without stenosis. No M1 stenosis or occlusion. Normal MCA bifurcations. Distal MCA branches well perfused and symmetric. Posterior circulation: Both V4 segments patent to the vertebrobasilar junction without stenosis. Right PICA origin patent and normal. Left PICA not definitely seen. Basilar patent to its distal aspect without stenosis. Superior cerebellar arteries patent bilaterally. Both PCAs primarily supplied via the basilar well perfused to their distal aspects. Venous sinuses: Grossly patent allowing for timing the contrast bolus. Anatomic variants: None significant.  No aneurysm. Review of the MIP images confirms the above  findings IMPRESSION: 1. Negative CTA for emergent large vessel occlusion. 2. Mild atheromatous change about the carotid siphons without hemodynamically significant or correctable stenosis. 3. Moderate right greater than left layering pleural effusions, partially visualized. Results discussed by telephone on 08/28/2020 at approximately 12:50 am with provider Brooke Dare , who verbally acknowledged these results. Electronically Signed   By: Rise Mu M.D.   On: 08/28/2020 01:11   EEG adult  Result Date: 08/28/2020 Charlsie Quest, MD     08/28/2020 11:48 AM  Patient Name: Tanaya Gogan MRN: [id number] Epilepsy Attending: Charlsie Quest Referring Physician/Provider: Dr Brooke Dare Date: 08/28/2020 Duration: 23.45 mins Patient history: 70 year old female presented with left sided weakness and confusion.  EEG evaluate for seizures. Level of alertness: Awake, asleep AEDs during EEG study: LEV Technical aspects: This EEG study was done with scalp electrodes positioned according to the 10-20 International system of electrode placement. Electrical activity was acquired at a sampling rate of 500Hz  and reviewed with a high frequency filter of 70Hz  and a low frequency filter of 1Hz . EEG data were recorded continuously and digitally stored. Description: No clear posterior dominant rhythm was seen.Sleep was characterized by vertex waves, sleep spindles (12 to 14 Hz), maximal frontocentral region. EEG showed continuous generalized and lateralized right hemisphere 3 to 5 Hz theta and delta slowing.  Sharp waves were noted in left frontal region. Hyperventilation and photic stimulation were not performed.   ABNORMALITY -Sharp wave, left frontal region -Continuous slow, generalized and lateralized right hemisphere IMPRESSION: This study showed evidence of epileptogenicity arising from left frontal region. Additionally there is evidence of cortical dysfunction in right hemisphere likely secondary to underlying structural abnormality/stroke. There is also moderate diffuse encephalopathy, nonspecific to etiology. No seizures were seen throughout the recording. Priyanka Annabelle Harman   Overnight EEG with video  Result Date: 08/29/2020 Charlsie Quest, MD     08/29/2020  9:59 AM Patient Name: Donzella Hemmert MRN: [id number] Epilepsy Attending: Charlsie Quest Referring Physician/Provider: Dr Lindie Spruce Duration: 08/28/2020 1419 to 08/29/2020 1000  Patient history: 70 year old female presented with left sided weakness and confusion.  EEG evaluate for seizures.   Level of alertness: Awake, asleep  AEDs during EEG study: LEV  Technical aspects: This EEG study was done with scalp electrodes positioned according to the 10-20 International system of electrode placement. Electrical activity was acquired at a sampling rate of 500Hz  and reviewed with a high frequency filter of 70Hz  and a low frequency filter of 1Hz . EEG data were recorded continuously and digitally stored.  Description: No clear posterior dominant rhythm was seen.Sleep was characterized by vertex waves, sleep spindles (12 to 14 Hz), maximal frontocentral region. EEG showed continuous generalized and lateralized right hemisphere 3 to 5 Hz theta and delta slowing.  Generalized periodic discharges with triphasic morphology at 1Hz  were noted predominantly when patient was awake/stimulated. Hyperventilation and photic stimulation were not performed.    ABNORMALITY -Periodic discharges with triphasic morphology, generalized -Continuous slow, generalized and lateralized right hemisphere  IMPRESSION: This study showed periodic discharges with triphasic morphology at 1 Hz which can be on the ictal-interictal continuum.  However the frequency, morphology and reactivity to stimulation is more commonly seen in toxic-metabolic causes.  Additionally there is evidence of cortical dysfunction in right hemisphere likely secondary to underlying structural abnormality/stroke. There is also moderate diffuse encephalopathy, nonspecific to etiology. No seizures were seen throughout the recording.  Priyanka Annabelle Harman   CT HEAD CODE STROKE WO CONTRAST  Result Date: 08/28/2020 CLINICAL DATA:  Code stroke. Initial evaluation for acute left-sided weakness. EXAM: CT HEAD WITHOUT CONTRAST TECHNIQUE: Contiguous axial images were obtained from the base of the skull through the vertex without intravenous contrast. COMPARISON:  None available. FINDINGS: Brain: Age-related cerebral atrophy with chronic microvascular ischemic disease.  Encephalomalacia involving the right insula and posterior right temporoparietal region consistent with a chronic right MCA distribution infarct. No acute intracranial hemorrhage. No visible acute large vessel territory infarct. No mass lesion or midline shift. No hydrocephalus or extra-axial fluid collection. Vascular: No hyperdense vessel. Scattered vascular calcifications noted within the carotid siphons. Skull: Scalp soft tissues and calvarium within normal limits. Sinuses/Orbits: Globes and orbital soft tissues within normal limits. Paranasal sinuses are clear. No mastoid effusion. Other: None. ASPECTS [facility] Stroke Program Early CT Score) - Ganglionic level infarction (caudate, lentiform nuclei, internal capsule, insula, M1-M3 cortex): 7 - Supraganglionic infarction (M4-M6 cortex): 3 Total score (0-10 with 10 being normal): 10 IMPRESSION: 1. No acute intracranial infarct or other abnormality. 2. ASPECTS is 10. 3. 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 4. Underlying atrophy with moderate chronic microvascular ischemic disease. These results were communicated to Dr. Iver Nestle at 12:24 am on 08/28/2020 by text page via the [facility] messaging system. Electronically Signed   By: Rise Mu M.D.   On: 08/28/2020 00:26   CT ANGIO HEAD NECK W WO CM (CODE STROKE)  Result Date: 08/28/2020 CLINICAL DATA:  Initial evaluation for acute stroke. EXAM: CT ANGIOGRAPHY HEAD AND NECK TECHNIQUE: Multidetector CT imaging of the head and neck was performed using the standard protocol during bolus administration of intravenous contrast. Multiplanar CT image reconstructions and MIPs were obtained to evaluate the vascular anatomy. Carotid stenosis measurements (when applicable) are obtained utilizing NASCET criteria, using the distal internal carotid diameter as the denominator. CONTRAST:  75mL OMNIPAQUE IOHEXOL 350 MG/ML SOLN COMPARISON:  Prior head CT from earlier the  same day. FINDINGS: CTA NECK FINDINGS Aortic arch: Visualized aortic arch normal in caliber with normal branch pattern. Mild atheromatous change about the arch itself. No hemodynamically significant stenosis about the origin the great vessels. Right carotid system: Right common and internal carotid arteries widely patent without stenosis, dissection or occlusion. Left carotid system: Left common and internal carotid arteries widely patent without stenosis, dissection or occlusion. Vertebral arteries: Both vertebral arteries arise from the subclavian arteries. No proximal subclavian artery stenosis. Both vertebral arteries widely patent without stenosis, dissection or occlusion. Skeleton: No visible acute osseous finding. No discrete or worrisome osseous lesions. Patient is edentulous. Other neck: No other acute soft tissue abnormality within the neck. No mass or adenopathy. Upper chest: Moderate right greater than left layering pleural effusions, partially visualized. Visualized upper chest demonstrates no other acute abnormality. Review of the MIP images confirms the above findings CTA HEAD FINDINGS Anterior circulation: Petrous segments widely patent. Mild atheromatous change within the carotid siphons without significant stenosis. A1 segments patent bilaterally. Left A1 duplicated, with duplication of the left M1 segment. Normal anterior communicating artery complex. Anterior cerebral arteries patent to their distal aspects without stenosis. No M1 stenosis or occlusion. Normal MCA bifurcations. Distal MCA branches well perfused and symmetric. Posterior circulation: Both V4 segments patent to the vertebrobasilar junction without stenosis. Right PICA origin patent and normal. Left PICA not definitely seen. Basilar patent to its distal aspect without stenosis. Superior cerebellar arteries patent bilaterally. Both PCAs primarily supplied via the basilar well perfused to their distal aspects. Venous sinuses: Grossly  patent allowing for timing the contrast bolus. Anatomic variants: None significant.  No aneurysm. Review of the MIP images confirms the above findings IMPRESSION: 1. Negative CTA for emergent large vessel occlusion. 2. Mild atheromatous change about the carotid siphons without hemodynamically significant or correctable stenosis. 3. Moderate right greater than left layering pleural effusions, partially visualized. Results discussed by telephone on 08/28/2020 at approximately 12:50 am with provider Brooke Dare , who verbally acknowledged these results. Electronically Signed   By: Rise Mu M.D.   On: 08/28/2020 01:11        Scheduled Meds:  aspirin EC  81 mg Oral Daily   enoxaparin (LOVENOX) injection  40 mg Subcutaneous Q24H   insulin aspart  0-15 Units Subcutaneous TID WC   insulin aspart  0-5 Units Subcutaneous QHS   insulin glargine-yfgn  15 Units Subcutaneous QHS   rosuvastatin  10 mg Oral Daily   sodium chloride flush  3 mL Intravenous Once   Continuous Infusions:  sodium chloride 75 mL/hr at 08/29/20 0041   levETIRAcetam 500 mg (08/29/20 0930)          Glade Lloyd, MD [facility] 08/29/2020, 10:59 AM

## 2020-08-29 NOTE — Procedures (Signed)
Patient Name: Erica Duncan  MRN: [id number]  Epilepsy Attending: Charlsie Quest  Referring Physician/Provider: Dr Lindie Spruce Duration: 08/28/2020 1419 to 08/29/2020 1000   Patient history: 70 year old female presented with left sided weakness and confusion.  EEG evaluate for seizures.   Level of alertness: Awake, asleep   AEDs during EEG study: LEV   Technical aspects: This EEG study was done with scalp electrodes positioned according to the 10-20 International system of electrode placement. Electrical activity was acquired at a sampling rate of 500Hz  and reviewed with a high frequency filter of 70Hz  and a low frequency filter of 1Hz . EEG data were recorded continuously and digitally stored.    Description: No clear posterior dominant rhythm was seen.Sleep was characterized by vertex waves, sleep spindles (12 to 14 Hz), maximal frontocentral region. EEG showed continuous generalized and lateralized right hemisphere 3 to 5 Hz theta and delta slowing.  Generalized periodic discharges with triphasic morphology at 1Hz  were noted predominantly when patient was awake/stimulated. Hyperventilation and photic stimulation were not performed.      ABNORMALITY -Periodic discharges with triphasic morphology, generalized -Continuous slow, generalized and lateralized right hemisphere   IMPRESSION: This study showed periodic discharges with triphasic morphology at 1 Hz which can be on the ictal-interictal continuum.  However the frequency, morphology and reactivity to stimulation is more commonly seen in toxic-metabolic causes.  Additionally there is evidence of cortical dysfunction in right hemisphere likely secondary to underlying structural abnormality/stroke. There is also moderate diffuse encephalopathy, nonspecific to etiology. No seizures were seen throughout the recording.   Latravious Levitt 

## 2020-08-29 NOTE — Progress Notes (Signed)
LTM maintenance completed; no skin breakdown was seen. 

## 2020-08-29 NOTE — Progress Notes (Signed)
Neurology Progress Note  Brief HPI: 70 y.o. spanish-speaking female with PMHx of right tempopartietal stroke, DM2, HTN, and HLD who presented to the ED 08/28/2020 for evaluation of seizure-like episodes with a focal seizure witnessed in the ED with left head turn and leftward gaze with left hemiplegia and intermittent left gaze deviation.   Subjective: No acute overnight events Patient remains lethargic  Exam: Vitals:   08/29/20 0400 08/29/20 0731  BP: 101/76 (!) 91/55  Pulse: 88 80  Resp: 14 20  Temp:  97.8 F (36.6 C)  SpO2: 94% 96%   Gen: Sleeping comfortably in bed, in no acute distress Resp: non-labored breathing, no respiratory distress. SpO2 97% on room air.  Abd: soft to palpation, non-distended  Neuro: Mental Status: Lethargic, does not open eyes to voice. Briefly opens eyes ~1 second and moans to touch and opens eyes minimally to command once.  Does not follow any other commands.  She has minimal vocalization and states "bien" when asked how she is feeling via telephone interpreter, otherwise, she moans throughout examination without further speech. She does not answer examiner questions regarding orientation.  She is unable to provide a clear and coherent history of present illness. No neglect is noted.  Cranial Nerves: PERRL with active resistance to examiner eye opening, will fixate briefly on examiner with brief eye opening but does not track, face appears symmetric resting and with grimace, hearing is intact to voice, head is grossly midline, phonation intact, unable to assess palate, patient does not protrude tongue to command. Motor: Withdraws each extremity briskly with light application of noxious stimuli without obvious asymmetry. Spontaneous antigravity movements of bilateral lower extremities noted.  Tone is normal, bulk is normal.  Sensory: Grimaces and withdraws each extremity with application of noxious stimuli throughout. Plantars: Toes downgoing bilaterally   Gait: Deferred  Pertinent Labs: CBC    Component Value Date/Time   WBC 8.9 08/29/2020 0415   RBC 5.18 (H) 08/29/2020 0415   HGB 11.6 (L) 08/29/2020 0415   HCT 37.4 08/29/2020 0415   PLT 115 (L) 08/29/2020 0415   MCV 72.2 (L) 08/29/2020 0415   MCH 22.4 (L) 08/29/2020 0415   MCHC 31.0 08/29/2020 0415   RDW 16.9 (H) 08/29/2020 0415   LYMPHSABS 0.8 08/29/2020 0415   MONOABS 0.7 08/29/2020 0415   EOSABS 0.0 08/29/2020 0415   BASOSABS 0.0 08/29/2020 0415   CMP     Component Value Date/Time   NA 138 08/29/2020 0415   K 4.0 08/29/2020 0415   CL 105 08/29/2020 0415   CO2 21 (L) 08/29/2020 0415   GLUCOSE 123 (H) 08/29/2020 0415   BUN 24 (H) 08/29/2020 0415   CREATININE 1.66 (H) 08/29/2020 0415   CALCIUM 8.9 08/29/2020 0415   PROT 5.8 (L) 08/29/2020 0415   ALBUMIN 2.8 (L) 08/29/2020 0415   AST 31 08/29/2020 0415   ALT 20 08/29/2020 0415   ALKPHOS 49 08/29/2020 0415   BILITOT 1.3 (H) 08/29/2020 0415   GFRNONAA 33 (L) 08/29/2020 0415   Urinalysis No results found for: COLORURINE, APPEARANCEUR, LABSPEC, PHURINE, GLUCOSEU, HGBUR, BILIRUBINUR, KETONESUR, PROTEINUR, UROBILINOGEN, NITRITE, LEUKOCYTESUR  Lab Results  Component Value Date   TSH 0.360 08/29/2020   Imaging Reviewed:  MRI brain without contrast 08/28/2020: Motion degraded study.  The study was terminated prematurely Negative for acute infarct.  CT Head WO contrast 08/28/2020: 1. No acute intracranial infarct or other abnormality. 2. ASPECTS is 10. 3. 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 4.  Underlying atrophy with moderate chronic microvascular ischemic disease.  CT angio head and neck with CT cerebral perfusion [facility] 08/28/2020: 1. Negative CTA for emergent large vessel occlusion. 2. Mild atheromatous change about the carotid siphons without hemodynamically significant or correctable stenosis. 3. Moderate right greater than left layering pleural effusions,  partially visualized.  Routine EEG: "This study showed evidence of epileptogenicity arising from left frontal region. Additionally there is evidence of cortical dysfunction in right hemisphere likely secondary to underlying structural abnormality/stroke. There is also moderate diffuse encephalopathy, nonspecific to etiology. No seizures were seen throughout the recording."  Overnight EEG 08/28/2020 - 08/29/2020: "This study showed periodic discharges with triphasic morphology at 1 Hz which can be on the ictal-interictal continuum.  However the frequency, morphology and reactivity to stimulation is more commonly seen in toxic-metabolic causes.  Additionally there is evidence of cortical dysfunction in right hemisphere likely secondary to underlying structural abnormality/stroke. There is also moderate diffuse encephalopathy, nonspecific to etiology. No seizures were seen throughout the recording."  Assessment:  [name] with chronic right temporoparietal infarct, left frontal infarct and chronic microhemorrhage in right cerebellum who presented with left-sided weakness and confusion and was then noted to have a focal seizure in the ED. - Examination reveals patient with lethargy who does not vocalize more than one word during assessment or follow commands with the assistance from a telephone interpreter. She briefly opens eyes but does not track examiner.  - On reassessment in the afternoon around 14:30, patient is sitting up in the bed, awake, alert, and oriented x 4.  She follows all commands with 5/5 strength present in all extremities and daughter states that she feels her mother is at her baseline mental status. Daughter is at bedside and updated by neurology NP. - Witnessed focal seizure in the ED on presentation with termination following 2 mg IV Ativan.  - Overnight EEG with evidence of periodic discharges with triphasic morphology that can be on the ictal-interictal  continuum but appeared to be consistently seen with toxic-metabolic derangements. There was also evidence of cortical dysfunction in the right cerebral hemisphere likely secondary to an underlying structural abnormality. There was no evidence of seizures throughout the recording.  - MRI brain with motion degradation and early termination but within that limitation, is negative for acute infarction.   - Patient is afebrile, no leukocytosis, no neck rigidity. Therefore low suspicion for meningitis.   - Encephalopathy presentation may be consistent with seizure with prolonged post-ictal state versus toxic-metabolic encephalopathy in the setting  of poor brain reserve.   CMP with persistently elevated blood glucose and with hypomagnesemia this morning.   Creatinine elevated at 1.66 with eGFR of 33 with history of CKD 3b.  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 Agree with the history and physical as documented above. Agree with the plan as documented, which I helped formulate. I have independently reviewed the chart, obtained history, review of systems and examined the patient.I have personally reviewed pertinent head/neck/spine imaging (CT/MRI). Please feel free to call with any questions.  -- AAmie Portland MD Neurologist [facility] Pager: [phone]

## 2020-08-29 NOTE — Plan of Care (Signed)
  Problem: Coping: Goal: Ability to adjust to condition or change in health will improve Outcome: Progressing Goal: Ability to identify appropriate support needs will improve Outcome: Progressing   Problem: Clinical Measurements: Goal: Diagnostic test results will improve Outcome: Progressing

## 2020-08-29 NOTE — Progress Notes (Signed)
Received a call from bedside RN regarding patient's daughter wanting updates.  Called and spoke with her daughter via phone.  She had already been updated earlier on 08/28/2020 by neurology.  There has been no significant changes.  Daughter requests to be updated daily.  At this time the patient is sleeping comfortably in her bed.  Please call patient's daughter to provide further updates during the day. Ms. Dorthula Nettles [phone].

## 2020-08-29 NOTE — Progress Notes (Signed)
EEG machine and patient, transported to 3W.  All leads attached.  Computer setup, study   with video running correctly. [facility] monitored,  confirmed with [facility].

## 2020-08-29 NOTE — Progress Notes (Signed)
Physical Therapy Treatment Patient Details Name: Erica Duncan MRN: [id number] DOB: 12-22-1950 Today's Date: 08/29/2020    History of Present Illness 70yo female presenting to [facility] ED on 8/10 with acute-onset L weakness and confusion.  Pt was observed to have seizure like activity in ED.  MRI 8/11 with no acute findings. 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    PT Comments    Patient asleep in bed on arrival with daughter present. Patient easily awoken and with improved arousal since last session. Patient performing bed mobility and transfers with minA+2. Patient performing side steps towards [facility] with minA+2 for balance. Distance limited by continuous EEG. Patient became nauseous following activity and denies pain or dizziness. Updated d/c recommendation to home with HHPT to maximize functional mobility and safety.     Follow Up Recommendations  Home health PT (pending progress)     Equipment Recommendations  Other (comment) (TBD)    Recommendations for Other Services       Precautions / Restrictions Precautions Precautions: Fall Precaution Comments: continuous EEG Restrictions Weight Bearing Restrictions: No    Mobility  Bed Mobility Overal bed mobility: Needs Assistance Bed Mobility: Supine to Sit;Sit to Supine     Supine to sit: Min assist;+2 for safety/equipment;HOB elevated Sit to supine: Min assist;+2 for safety/equipment;HOB elevated   General bed mobility comments: Min A to hold therapist's hand and pull into upright posture. Min A for bringing BLEs over EOB in return to supine    Transfers Overall transfer level: Needs assistance Equipment used: 2 person hand held assist Transfers: Sit to/from Stand Sit to Stand: Min assist;+2 physical assistance         General transfer comment: Min A +2 to initiate power up and gain balance in  standing  Ambulation/Gait Ambulation/Gait assistance: Min assist;+2 safety/equipment Gait Distance (Feet): 2 Feet Assistive device: 2 person hand held assist       General Gait Details: side steps towards [facility] with minA for balance. Distance limited by continuous EEG   Stairs             Wheelchair Mobility    Modified Rankin (Stroke Patients Only)       Balance Overall balance assessment: Needs assistance Sitting-balance support: No upper extremity supported;Feet supported Sitting balance-Leahy Scale: Good     Standing balance support: Bilateral upper extremity supported;During functional activity Standing balance-Leahy Scale: Poor Standing balance comment: reliant on UE support and physical A                            Cognition Arousal/Alertness: Awake/alert Behavior During Therapy: WFL for tasks assessed/performed Overall Cognitive Status: Impaired/Different from baseline Area of Impairment: Problem solving;Awareness                           Awareness: Emergent Problem Solving: Slow processing;Requires verbal cues General Comments: Pt requiring increased time. Reports she is feeling much better but still not back to normal      Exercises      General Comments General comments (skin integrity, edema, etc.): Daughter present throughout. VSS on RA throughout      Pertinent Vitals/Pain Pain Assessment: No/denies pain    Home Living                      Prior  Function            PT Goals (current goals can now be found in the care plan section) Acute Rehab PT Goals Patient Stated Goal: per family to get better and go home PT Goal Formulation: With patient/family Time For Goal Achievement: 09/11/20 Potential to Achieve Goals: Good Progress towards PT goals: Progressing toward goals    Frequency    Min 3X/week      PT Plan Discharge plan needs to be updated;Frequency needs to be updated    Co-evaluation  PT/OT/SLP Co-Evaluation/Treatment: Yes Reason for Co-Treatment: For patient/therapist safety;To address functional/ADL transfers PT goals addressed during session: Mobility/safety with mobility;Balance OT goals addressed during session: ADL's and self-care      AM-PAC PT "6 Clicks" Mobility   Outcome Measure  Help needed turning from your back to your side while in a flat bed without using bedrails?: A Little Help needed moving from lying on your back to sitting on the side of a flat bed without using bedrails?: A Little Help needed moving to and from a bed to a chair (including a wheelchair)?: A Little Help needed standing up from a chair using your arms (e.g., wheelchair or bedside chair)?: A Little Help needed to walk in hospital room?: A Little Help needed climbing 3-5 steps with a railing? : A Little 6 Click Score: 18    End of Session   Activity Tolerance: Patient tolerated treatment well Patient left: in bed;with call bell/phone within reach;with bed alarm set;with family/visitor present Nurse Communication: Mobility status PT Visit Diagnosis: Muscle weakness (generalized) (M62.81)     Time: 5537-4827 PT Time Calculation (min) (ACUTE ONLY): 27 min  Charges:  $Therapeutic Activity: 8-22 mins                     Kennedey Digilio A. Dan Humphreys PT, DPT Acute Rehabilitation Services Pager [phone] Office [phone]    Viviann Spare 08/29/2020, 4:58 PM

## 2020-08-29 NOTE — ED Notes (Signed)
Spoke with Dr. Margo Aye MD. Provider stated not to give insulin and will put in orders for pain medication.

## 2020-08-30 LAB — BASIC METABOLIC PANEL
Anion gap: 12 (ref 5–15)
BUN: 29 mg/dL — ABNORMAL HIGH (ref 8–23)
CO2: 22 mmol/L (ref 22–32)
Calcium: 9.2 mg/dL (ref 8.9–10.3)
Chloride: 103 mmol/L (ref 98–111)
Creatinine, Ser: 1.57 mg/dL — ABNORMAL HIGH (ref 0.44–1.00)
GFR, Estimated: 35 mL/min — ABNORMAL LOW (ref >60)
Glucose, Bld: 183 mg/dL — ABNORMAL HIGH (ref 70–99)
Potassium: 3.9 mmol/L (ref 3.5–5.1)
Sodium: 137 mmol/L (ref 135–145)

## 2020-08-30 LAB — CBC WITH DIFFERENTIAL/PLATELET
Abs Immature Granulocytes: 0.04 10*3/uL (ref 0.00–0.07)
Basophils Absolute: 0 10*3/uL (ref 0.0–0.1)
Basophils Relative: 0 %
Eosinophils Absolute: 0 10*3/uL (ref 0.0–0.5)
Eosinophils Relative: 0 %
HCT: 37.6 % (ref 36.0–46.0)
Hemoglobin: 12 g/dL (ref 12.0–15.0)
Immature Granulocytes: 1 %
Lymphocytes Relative: 13 %
Lymphs Abs: 1 10*3/uL (ref 0.7–4.0)
MCH: 22.6 pg — ABNORMAL LOW (ref 26.0–34.0)
MCHC: 31.9 g/dL (ref 30.0–36.0)
MCV: 70.9 fL — ABNORMAL LOW (ref 80.0–100.0)
Monocytes Absolute: 0.8 10*3/uL (ref 0.1–1.0)
Monocytes Relative: 9 %
Neutro Abs: 6.3 10*3/uL (ref 1.7–7.7)
Neutrophils Relative %: 77 %
Platelets: 132 10*3/uL — ABNORMAL LOW (ref 150–400)
RBC: 5.3 MIL/uL — ABNORMAL HIGH (ref 3.87–5.11)
RDW: 16.5 % — ABNORMAL HIGH (ref 11.5–15.5)
WBC: 8.2 10*3/uL (ref 4.0–10.5)
nRBC: 0 % (ref 0.0–0.2)

## 2020-08-30 LAB — GLUCOSE, CAPILLARY
Glucose-Capillary: 188 mg/dL — ABNORMAL HIGH (ref 70–99)
Glucose-Capillary: 166 mg/dL — ABNORMAL HIGH (ref 70–99)
Glucose-Capillary: 213 mg/dL — ABNORMAL HIGH (ref 70–99)
Glucose-Capillary: 162 mg/dL — ABNORMAL HIGH (ref 70–99)

## 2020-08-30 LAB — VITAMIN B12: Vitamin B-12: 721 pg/mL (ref 180–914)

## 2020-08-30 LAB — MAGNESIUM: Magnesium: 1.8 mg/dL (ref 1.7–2.4)

## 2020-08-30 MED ORDER — SODIUM CHLORIDE 0.9 % IV SOLN
250.0000 mg | INTRAVENOUS | Status: DC
  Administered 2020-08-31: 250 mg via INTRAVENOUS
  Filled 2020-08-30: qty 2.5

## 2020-08-30 MED ORDER — LEVETIRACETAM IN NACL 500 MG/100ML IV SOLN
500.0000 mg | INTRAVENOUS | Status: DC
  Administered 2020-08-30: 500 mg via INTRAVENOUS
  Filled 2020-08-30: qty 100

## 2020-08-30 NOTE — Plan of Care (Signed)
  Problem: Coping: Goal: Ability to adjust to condition or change in health will improve 08/30/2020 1426 by Melvenia Needles, RN Outcome: Progressing 08/30/2020 1426 by Melvenia Needles, RN Outcome: Progressing Goal: Ability to identify appropriate support needs will improve 08/30/2020 1426 by Melvenia Needles, RN Outcome: Progressing 08/30/2020 1426 by Melvenia Needles, RN Outcome: Progressing   Problem: Medication: Goal: Risk for medication side effects will decrease 08/30/2020 1426 by Melvenia Needles, RN Outcome: Progressing 08/30/2020 1426 by Melvenia Needles, RN Outcome: Progressing   Problem: Ischemic Stroke/TIA Tissue Perfusion: Goal: Complications of ischemic stroke/TIA will be minimized Outcome: Progressing

## 2020-08-30 NOTE — Progress Notes (Addendum)
Neurology Progress Note  Brief HPI: 70 y.o. spanish-speaking female with PMHx of right tempopartietal stroke, DM2, HTN, and HLD who presented to the ED 08/28/2020 for evaluation of seizure-like episodes with a focal seizure witnessed in the ED with left head turn and leftward gaze with left hemiplegia and intermittent left gaze deviation.   Subjective: No acute overnight events Remains very lethargic.  Was somewhat perky yesterday afternoon but again remains lethargic  Exam: Vitals:   08/30/20 0724 08/30/20 1103  BP: 101/72 101/72  Pulse: (!) 101 96  Resp: 20 20  Temp: 98.8 F (37.1 C) 98.4 F (36.9 C)  SpO2: 93% 95%   Gen: Sleeping comfortably in bed, in no acute distress Resp: non-labored breathing, no respiratory distress. SpO2 97% on room air.  Abd: soft to palpation, non-distended  Neuro: Mental Status: Sleeping in bed, opens eyes slowly.  Follows simple commands.  Able to follow some commands that the family says.  Keeps falling asleep during this encounter. Cranial Nerves: PERRL with active resistance to examiner eye opening, will fixate briefly on examiner with brief eye opening but does not track, face appears symmetric resting and with grimace, hearing is intact to voice, head is grossly midline, phonation intact, unable to assess palate, patient does not protrude tongue to command. Motor: Withdraws each extremity briskly with light application of noxious stimuli without obvious asymmetry. Spontaneous antigravity movements of bilateral lower extremities noted with weak left lower extremity.  Tone is normal, bulk is normal.  Sensory: Grimaces and withdraws each extremity with application of noxious stimuli throughout. Plantars: Toes downgoing bilaterally  Gait: Deferred  Pertinent Labs: CBC    Component Value Date/Time   WBC 8.2 08/30/2020 0433   RBC 5.30 (H) 08/30/2020 0433   HGB 12.0 08/30/2020 0433   HCT 37.6 08/30/2020 0433   PLT 132 (L) 08/30/2020 0433   MCV  70.9 (L) 08/30/2020 0433   MCH 22.6 (L) 08/30/2020 0433   MCHC 31.9 08/30/2020 0433   RDW 16.5 (H) 08/30/2020 0433   LYMPHSABS 1.0 08/30/2020 0433   MONOABS 0.8 08/30/2020 0433   EOSABS 0.0 08/30/2020 0433   BASOSABS 0.0 08/30/2020 0433   CMP     Component Value Date/Time   NA 137 08/30/2020 0433   K 3.9 08/30/2020 0433   CL 103 08/30/2020 0433   CO2 22 08/30/2020 0433   GLUCOSE 183 (H) 08/30/2020 0433   BUN 29 (H) 08/30/2020 0433   CREATININE 1.57 (H) 08/30/2020 0433   CALCIUM 9.2 08/30/2020 0433   PROT 5.8 (L) 08/29/2020 0415   ALBUMIN 2.8 (L) 08/29/2020 0415   AST 31 08/29/2020 0415   ALT 20 08/29/2020 0415   ALKPHOS 49 08/29/2020 0415   BILITOT 1.3 (H) 08/29/2020 0415   GFRNONAA 35 (L) 08/30/2020 0433   Urinalysis No results found for: COLORURINE, APPEARANCEUR, LABSPEC, PHURINE, GLUCOSEU, HGBUR, BILIRUBINUR, KETONESUR, PROTEINUR, UROBILINOGEN, NITRITE, LEUKOCYTESUR  Lab Results  Component Value Date   TSH 0.360 08/29/2020   Imaging Reviewed:  MRI brain without contrast 08/28/2020: Motion degraded study.  The study was terminated prematurely Negative for acute infarct.  CT Head WO contrast 08/28/2020: 1. No acute intracranial infarct or other abnormality. 2. ASPECTS is 10. 3. 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 4. Underlying atrophy with moderate chronic microvascular ischemic disease.  CT angio head and neck with CT cerebral perfusion [facility] 08/28/2020: 1. Negative CTA for emergent large vessel occlusion. 2. Mild atheromatous change about the carotid siphons without hemodynamically significant  or correctable stenosis. 3. Moderate right greater than left layering pleural effusions, partially visualized.  Routine EEG: "This study showed evidence of epileptogenicity arising from left frontal region. Additionally there is evidence of cortical dysfunction in right hemisphere likely secondary to underlying structural  abnormality/stroke. There is also moderate diffuse encephalopathy, nonspecific to etiology. No seizures were seen throughout the recording."  Overnight EEG 08/28/2020 - 08/29/2020: "This study showed periodic discharges with triphasic morphology at 1 Hz which can be on the ictal-interictal continuum.  However the frequency, morphology and reactivity to stimulation is more commonly seen in toxic-metabolic causes.  Additionally there is evidence of cortical dysfunction in right hemisphere likely secondary to underlying structural abnormality/stroke. There is also moderate diffuse encephalopathy, nonspecific to etiology. No seizures were seen throughout the recording."  Assessment:   70 year old with past history of right temporal parietal infarct with residual left-sided weakness, multiple other prior infarcts presenting with first-time seizure that broke with benzodiazepine and antiepileptics. Remains somewhat somnolent-is only on Keppra 500 twice daily. 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 and 500 at night. - Continue inpatient seizure precautions - 2mg  Ativan PRN clinical seizure activity lasting > 5 minutes and notify neurology  - Management of toxic metabolic comorbidities per primary team -- , MD Neurologist [facility] Pager: [phone]

## 2020-08-30 NOTE — Progress Notes (Signed)
Patient ID: Erica Duncan, female   DOB: 1950-02-09, 70 y.o.   MRN: [id number]  PROGRESS NOTE    Nyelle Wolfson  VOZ:[id number] DOB: 02-03-1950 DOA: 08/26/2020 PCP: [facility]   Brief Narrative:  70 year old female with history of right frontotemporal stroke in 2021, hypertension, hyperlipidemia, diabetes mellitus type 2 presented with acute onset of left-sided weakness and confusion.  Patient was extremely lethargic on presentation.  On presentation, CT of the head revealed chronic MCA distribution infarct.  Neurology was consulted.  She was exhibiting rhythmic movements of the left leg and left arm concerning for focal seizure activity and she was started on IV Ativan and Keppra.  Assessment & Plan:   Seizures, most likely due to underlying stroke History of right frontotemporal stroke in 2021 -Presented with acute onset of left-sided weakness and confusion and had focal movements of left upper and lower extremity concerning for seizure in the ED -CT of the head was negative for acute intracranial abnormality but showed chronic MCA distal infarct.  MRI of brain was negative for acute infarct although it was a motion degraded study.  CT cerebral perfusion with contrast was negative for emergent large vessel occlusion -EEG was negative for seizures.  Patient underwent LTM EEG did not show any seizures.  Currently on IV Keppra.  Patient is drowsy this morning although she was more awake and alert during the day yesterday afternoon and tolerated physical therapy.  Follow neurology recommendations. -Seizure precautions.  Fall/aspiration precautions -Continue aspirin/statin  Essential hypertension -blood pressure on the lower side.  Antihypertensives on hold.  Diabetes mellitus type 2 with hyperglycemia and hypoglycemia -A1c 7.9.  Patient has had hyperglycemia and hypoglycemia.  Continue long-acting insulin.  Continue CBGs with SSI.  Chronic kidney disease  stage IIIb -Creatinine improving.  DC IV fluids and encourage oral intake.    Mild hyponatremia -Improved.  Monitor  Thrombocytopenia -Questionable cause.  Monitor  Hypomagnesemia -Replaced and improved.  DVT prophylaxis: Lovenox Code Status: Full Family Communication: None at bedside Disposition Plan: Status is: Inpatient  Remains inpatient appropriate because:Inpatient level of care appropriate due to severity of illness  Dispo: The patient is from: Home              Anticipated d/c is to: Home              Patient currently is not medically stable to d/c.   Difficult to place patient No  Consultants: Neurology  Procedures: EEG/LTM EEG  Antimicrobials: None   Subjective: Patient seen and examined at bedside.  Patient is still sleepy, wakes up only very slightly, hardly answers any questions.  No overnight fever, seizures, vomiting reported. Objective: Vitals:   08/29/20 1930 08/29/20 2335 08/30/20 0724 08/30/20 1103  BP: (!) 95/50 105/76 101/72 101/72  Pulse: 96 (!) 105 (!) 101 96  Resp: 20 16 20 20   Temp: 98 F (36.7 C) 97.9 F (36.6 C) 98.8 F (37.1 C) 98.4 F (36.9 C)  TempSrc: Oral Oral Axillary Oral  SpO2: 98% 94% 93% 95%  Weight:        Intake/Output Summary (Last 24 hours) at 08/30/2020 1141 Last data filed at 08/30/2020 0640 Gross per 24 hour  Intake 1845.58 ml  Output --  Net 1845.58 ml    Filed Weights   08/28/20 0010  Weight: 60.8 kg    Examination:  General exam: No distress.  Still on room air currently.  Looks chronically ill and deconditioned.   Respiratory system: Decreased  breath sounds at bases bilaterally cardiovascular system: Tachycardic intermittently, S1-S2 heard gastrointestinal system: Abdomen is slightly distended, soft and nontender.  Bowel sounds are heard  extremities: No edema or cyanosis Central nervous system: Drowsy, wakes up only very slightly, hardly answers any questions.  No focal neurological deficits.  Moves  extremities.   Skin: No obvious ecchymosis/lesions Psychiatry: Cannot be assessed because of mental status   Data Reviewed: I have personally reviewed following labs and imaging studies  CBC: Recent Labs  Lab 08/28/20 0001 08/28/20 0012 08/28/20 0801 08/29/20 0415 08/30/20 0433  WBC 6.4  --  6.5 8.9 8.2  NEUTROABS 4.6  --   --  7.4 6.3  HGB 12.5 15.0 12.3 11.6* 12.0  HCT 41.6 44.0 38.8 37.4 37.6  MCV 73.4*  --  71.9* 72.2* 70.9*  PLT 165  --  147* 115* 132*    Basic Metabolic Panel: Recent Labs  Lab 08/28/20 0001 08/28/20 0012 08/28/20 0801 08/29/20 0415 08/30/20 0433  NA 133* 135 134* 138 137  K 4.7 4.7 4.9 4.0 3.9  CL 98 100 98 105 103  CO2 21*  --  22 21* 22  GLUCOSE 339* 343* 354* 123* 183*  BUN 21 22 21  24* 29*  CREATININE 1.74* 1.60* 1.71* 1.66* 1.57*  CALCIUM 9.9  --  9.7 8.9 9.2  MG  --   --   --  1.5* 1.8    GFR: CrCl cannot be calculated (Unknown ideal weight.). Liver Function Tests: Recent Labs  Lab 08/28/20 0001 08/28/20 0801 08/29/20 0415  AST 32 34 31  ALT 19 20 20   ALKPHOS 66 62 49  BILITOT 1.3* 1.4* 1.3*  PROT 7.2 6.7 5.8*  ALBUMIN 3.5 3.2* 2.8*    No results for input(s): LIPASE, AMYLASE in the last 168 hours. Recent Labs  Lab 08/29/20 1220  AMMONIA 28   Coagulation Profile: Recent Labs  Lab 08/28/20 0001  INR 1.3*    Cardiac Enzymes: No results for input(s): CKTOTAL, CKMB, CKMBINDEX, TROPONINI in the last 168 hours. BNP (last 3 results) No results for input(s): PROBNP in the last 8760 hours. HbA1C: Recent Labs    08/28/20 0801  HGBA1C 7.9*    CBG: Recent Labs  Lab 08/29/20 1228 08/29/20 1632 08/29/20 2152 08/30/20 0721 08/30/20 1121  GLUCAP 159* 208* 218* 188* 166*    Lipid Profile: Recent Labs    08/28/20 0801  CHOL 111  HDL 54  LDLCALC 40  TRIG 87  CHOLHDL 2.1    Thyroid Function Tests: Recent Labs    08/29/20 1220  TSH 0.360   Anemia Panel: Recent Labs    08/30/20 0433  VITAMINB12  721   Sepsis Labs: No results for input(s): PROCALCITON, LATICACIDVEN in the last 168 hours.  Recent Results (from the past 240 hour(s))  SARS CORONAVIRUS 2 (TAT 6-24 HRS) Nasopharyngeal Nasopharyngeal Swab     Status: None   Collection Time: 08/28/20 10:19 AM   Specimen: Nasopharyngeal Swab  Result Value Ref Range Status   SARS Coronavirus 2 NEGATIVE NEGATIVE Final    Comment: (NOTE) SARS-CoV-2 target nucleic acids are NOT DETECTED.  The SARS-CoV-2 RNA is generally detectable in upper and lower respiratory specimens during the acute phase of infection. Negative results do not preclude SARS-CoV-2 infection, do not rule out co-infections with other pathogens, and should not be used as the sole basis for treatment or other patient management decisions. Negative results must be combined with clinical observations, patient history, and epidemiological information. The expected result  is Negative.  Fact Sheet for Patients: HairSlick.no  Fact Sheet for Healthcare Providers: quierodirigir.com  This test is not yet approved or cleared by the [place] FDA and  has been authorized for detection and/or diagnosis of SARS-CoV-2 by FDA under an Emergency Use Authorization (EUA). This EUA will remain  in effect (meaning this test can be used) for the duration of the COVID-19 declaration under Se ction 564(b)(1) of the Act, 21 U.S.C. section 360bbb-3(b)(1), unless the authorization is terminated or revoked sooner.  Performed at [facility], [address] [place], [place] [address]           Radiology Studies: CT CEREBRAL PERFUSION W CONTRAST  Result Date: 08/28/2020 CLINICAL DATA:  Initial evaluation for acute stroke. EXAM: CT ANGIOGRAPHY HEAD AND NECK TECHNIQUE: Multidetector CT imaging of the head and neck was performed using the standard protocol during bolus administration of intravenous contrast. Multiplanar CT  image reconstructions and MIPs were obtained to evaluate the vascular anatomy. Carotid stenosis measurements (when applicable) are obtained utilizing NASCET criteria, using the distal internal carotid diameter as the denominator. CONTRAST:  66mL OMNIPAQUE IOHEXOL 350 MG/ML SOLN COMPARISON:  Prior head CT from earlier the same day. FINDINGS: CTA NECK FINDINGS Aortic arch: Visualized aortic arch normal in caliber with normal branch pattern. Mild atheromatous change about the arch itself. No hemodynamically significant stenosis about the origin the great vessels. Right carotid system: Right common and internal carotid arteries widely patent without stenosis, dissection or occlusion. Left carotid system: Left common and internal carotid arteries widely patent without stenosis, dissection or occlusion. Vertebral arteries: Both vertebral arteries arise from the subclavian arteries. No proximal subclavian artery stenosis. Both vertebral arteries widely patent without stenosis, dissection or occlusion. Skeleton: No visible acute osseous finding. No discrete or worrisome osseous lesions. Patient is edentulous. Other neck: No other acute soft tissue abnormality within the neck. No mass or adenopathy. Upper chest: Moderate right greater than left layering pleural effusions, partially visualized. Visualized upper chest demonstrates no other acute abnormality. Review of the MIP images confirms the above findings CTA HEAD FINDINGS Anterior circulation: Petrous segments widely patent. Mild atheromatous change within the carotid siphons without significant stenosis. A1 segments patent bilaterally. Left A1 duplicated, with duplication of the left M1 segment. Normal anterior communicating artery complex. Anterior cerebral arteries patent to their distal aspects without stenosis. No M1 stenosis or occlusion. Normal MCA bifurcations. Distal MCA branches well perfused and symmetric. Posterior circulation: Both V4 segments patent to the  vertebrobasilar junction without stenosis. Right PICA origin patent and normal. Left PICA not definitely seen. Basilar patent to its distal aspect without stenosis. Superior cerebellar arteries patent bilaterally. Both PCAs primarily supplied via the basilar well perfused to their distal aspects. Venous sinuses: Grossly patent allowing for timing the contrast bolus. Anatomic variants: None significant.  No aneurysm. Review of the MIP images confirms the above findings IMPRESSION: 1. Negative CTA for emergent large vessel occlusion. 2. Mild atheromatous change about the carotid siphons without hemodynamically significant or correctable stenosis. 3. Moderate right greater than left layering pleural effusions, partially visualized. Results discussed by telephone on 08/28/2020 at approximately 12:50 am with provider Brooke Dare , who verbally acknowledged these results. Electronically Signed   By: Rise Mu M.D.   On: 08/28/2020 01:11   EEG adult  Result Date: 08/28/2020 Charlsie Quest, MD     08/28/2020 11:48 AM Patient Name: Mayling Aber MRN: [id number] Epilepsy Attending: Charlsie Quest Referring Physician/Provider: Dr Judie Bonus  Bhagat Date: 08/28/2020 Duration: 23.45 mins Patient history: 70 year old female presented with left sided weakness and confusion.  EEG evaluate for seizures. Level of alertness: Awake, asleep AEDs during EEG study: LEV Technical aspects: This EEG study was done with scalp electrodes positioned according to the 10-20 International system of electrode placement. Electrical activity was acquired at a sampling rate of 500Hz  and reviewed with a high frequency filter of 70Hz  and a low frequency filter of 1Hz . EEG data were recorded continuously and digitally stored. Description: No clear posterior dominant rhythm was seen.Sleep was characterized by vertex waves, sleep spindles (12 to 14 Hz), maximal frontocentral region. EEG showed continuous generalized and  lateralized right hemisphere 3 to 5 Hz theta and delta slowing.  Sharp waves were noted in left frontal region. Hyperventilation and photic stimulation were not performed.   ABNORMALITY -Sharp wave, left frontal region -Continuous slow, generalized and lateralized right hemisphere IMPRESSION: This study showed evidence of epileptogenicity arising from left frontal region. Additionally there is evidence of cortical dysfunction in right hemisphere likely secondary to underlying structural abnormality/stroke. There is also moderate diffuse encephalopathy, nonspecific to etiology. No seizures were seen throughout the recording. Priyanka Annabelle Harman   Overnight EEG with video  Result Date: 08/29/2020 Charlsie Quest, MD     08/29/2020  9:59 AM Patient Name: Joshalyn Tobler MRN: [id number] Epilepsy Attending: Charlsie Quest Referring Physician/Provider: Dr Lindie Spruce Duration: 08/28/2020 1419 to 08/29/2020 1000  Patient history: 70 year old female presented with left sided weakness and confusion.  EEG evaluate for seizures.  Level of alertness: Awake, asleep  AEDs during EEG study: LEV  Technical aspects: This EEG study was done with scalp electrodes positioned according to the 10-20 International system of electrode placement. Electrical activity was acquired at a sampling rate of 500Hz  and reviewed with a high frequency filter of 70Hz  and a low frequency filter of 1Hz . EEG data were recorded continuously and digitally stored.  Description: No clear posterior dominant rhythm was seen.Sleep was characterized by vertex waves, sleep spindles (12 to 14 Hz), maximal frontocentral region. EEG showed continuous generalized and lateralized right hemisphere 3 to 5 Hz theta and delta slowing.  Generalized periodic discharges with triphasic morphology at 1Hz  were noted predominantly when patient was awake/stimulated. Hyperventilation and photic stimulation were not performed.    ABNORMALITY -Periodic discharges with  triphasic morphology, generalized -Continuous slow, generalized and lateralized right hemisphere  IMPRESSION: This study showed periodic discharges with triphasic morphology at 1 Hz which can be on the ictal-interictal continuum.  However the frequency, morphology and reactivity to stimulation is more commonly seen in toxic-metabolic causes.  Additionally there is evidence of cortical dysfunction in right hemisphere likely secondary to underlying structural abnormality/stroke. There is also moderate diffuse encephalopathy, nonspecific to etiology. No seizures were seen throughout the recording.  Priyanka Annabelle Harman        Scheduled Meds:  aspirin EC  81 mg Oral Daily   enoxaparin (LOVENOX) injection  40 mg Subcutaneous Q24H   insulin aspart  0-15 Units Subcutaneous TID WC   insulin aspart  0-5 Units Subcutaneous QHS   insulin glargine-yfgn  15 Units Subcutaneous QHS   rosuvastatin  10 mg Oral Daily   sodium chloride flush  3 mL Intravenous Once   Continuous Infusions:  levETIRAcetam 500 mg (08/30/20 0930)          Glade Lloyd, MD [facility] 08/30/2020, 11:41 AM

## 2020-08-31 ENCOUNTER — Other Ambulatory Visit: Payer: Self-pay

## 2020-08-31 ENCOUNTER — Encounter (HOSPITAL_COMMUNITY): Payer: Self-pay | Admitting: Internal Medicine

## 2020-08-31 ENCOUNTER — Inpatient Hospital Stay (HOSPITAL_COMMUNITY): Payer: Medicare HMO

## 2020-08-31 DIAGNOSIS — I4891 Unspecified atrial fibrillation: Secondary | ICD-10-CM | POA: Diagnosis not present

## 2020-08-31 DIAGNOSIS — I480 Paroxysmal atrial fibrillation: Secondary | ICD-10-CM | POA: Diagnosis not present

## 2020-08-31 DIAGNOSIS — E782 Mixed hyperlipidemia: Secondary | ICD-10-CM

## 2020-08-31 DIAGNOSIS — I952 Hypotension due to drugs: Secondary | ICD-10-CM | POA: Diagnosis not present

## 2020-08-31 DIAGNOSIS — E1169 Type 2 diabetes mellitus with other specified complication: Secondary | ICD-10-CM

## 2020-08-31 DIAGNOSIS — N1832 Chronic kidney disease, stage 3b: Secondary | ICD-10-CM

## 2020-08-31 DIAGNOSIS — I959 Hypotension, unspecified: Secondary | ICD-10-CM | POA: Diagnosis not present

## 2020-08-31 DIAGNOSIS — E1165 Type 2 diabetes mellitus with hyperglycemia: Secondary | ICD-10-CM

## 2020-08-31 DIAGNOSIS — Z794 Long term (current) use of insulin: Secondary | ICD-10-CM

## 2020-08-31 LAB — GLUCOSE, CAPILLARY
Glucose-Capillary: 101 mg/dL — ABNORMAL HIGH (ref 70–99)
Glucose-Capillary: 94 mg/dL (ref 70–99)
Glucose-Capillary: 115 mg/dL — ABNORMAL HIGH (ref 70–99)
Glucose-Capillary: 154 mg/dL — ABNORMAL HIGH (ref 70–99)

## 2020-08-31 LAB — BASIC METABOLIC PANEL
Anion gap: 12 (ref 5–15)
BUN: 33 mg/dL — ABNORMAL HIGH (ref 8–23)
CO2: 19 mmol/L — ABNORMAL LOW (ref 22–32)
Calcium: 8.5 mg/dL — ABNORMAL LOW (ref 8.9–10.3)
Chloride: 108 mmol/L (ref 98–111)
Creatinine, Ser: 1.81 mg/dL — ABNORMAL HIGH (ref 0.44–1.00)
GFR, Estimated: 30 mL/min — ABNORMAL LOW (ref >60)
Glucose, Bld: 119 mg/dL — ABNORMAL HIGH (ref 70–99)
Potassium: 3.6 mmol/L (ref 3.5–5.1)
Sodium: 139 mmol/L (ref 135–145)

## 2020-08-31 LAB — MAGNESIUM: Magnesium: 1.8 mg/dL (ref 1.7–2.4)

## 2020-08-31 MED ORDER — AMIODARONE HCL IN DEXTROSE 360-4.14 MG/200ML-% IV SOLN
60.0000 mg/h | INTRAVENOUS | Status: DC
  Administered 2020-08-31: 30 mg/h via INTRAVENOUS
  Administered 2020-09-01 – 2020-09-09 (×33): 60 mg/h via INTRAVENOUS
  Filled 2020-08-31 (×36): qty 200

## 2020-08-31 MED ORDER — AMIODARONE LOAD VIA INFUSION
150.0000 mg | Freq: Once | INTRAVENOUS | Status: AC
  Administered 2020-08-31: 150 mg via INTRAVENOUS
  Filled 2020-08-31: qty 83.34

## 2020-08-31 MED ORDER — METOPROLOL TARTRATE 5 MG/5ML IV SOLN
2.5000 mg | INTRAVENOUS | Status: DC | PRN
  Filled 2020-08-31 (×2): qty 5

## 2020-08-31 MED ORDER — SODIUM CHLORIDE 0.9 % IV SOLN
250.0000 mg | Freq: Two times a day (BID) | INTRAVENOUS | Status: DC
  Administered 2020-08-31 – 2020-09-09 (×18): 250 mg via INTRAVENOUS
  Filled 2020-08-31 (×21): qty 2.5

## 2020-08-31 MED ORDER — SODIUM CHLORIDE 0.9 % IV BOLUS
1000.0000 mL | Freq: Once | INTRAVENOUS | Status: AC
  Administered 2020-08-31: 1000 mL via INTRAVENOUS

## 2020-08-31 MED ORDER — AMIODARONE HCL IN DEXTROSE 360-4.14 MG/200ML-% IV SOLN
30.0000 mg/h | INTRAVENOUS | Status: AC
  Administered 2020-08-31 (×2): 60 mg/h via INTRAVENOUS
  Filled 2020-08-31: qty 200

## 2020-08-31 MED ORDER — SODIUM CHLORIDE 0.9 % IV BOLUS: 250.0000 mL | Freq: Once | INTRAVENOUS | Status: DC | PRN

## 2020-08-31 MED ORDER — HYDROMORPHONE HCL 1 MG/ML IJ SOLN
0.5000 mg | INTRAMUSCULAR | Status: AC
  Administered 2020-08-31: 0.5 mg via INTRAVENOUS
  Filled 2020-08-31: qty 0.5

## 2020-08-31 MED ORDER — SODIUM CHLORIDE 0.9 % IV SOLN
12.5000 mg | Freq: Four times a day (QID) | INTRAVENOUS | Status: DC | PRN
  Filled 2020-08-31: qty 0.5

## 2020-08-31 NOTE — Plan of Care (Signed)
  Problem: Coping: Goal: Ability to adjust to condition or change in health will improve Outcome: Progressing Goal: Ability to identify appropriate support needs will improve Outcome: Progressing   Problem: Health Behavior/Discharge Planning: Goal: Compliance with prescribed medication regimen will improve Outcome: Progressing   Problem: Medication: Goal: Risk for medication side effects will decrease Outcome: Progressing   Problem: Clinical Measurements: Goal: Diagnostic test results will improve Outcome: Progressing   Problem: Safety: Goal: Verbalization of understanding the information provided will improve Outcome: Progressing   Problem: Self-Concept: Goal: Ability to verbalize feelings about condition will improve Outcome: Progressing   Problem: Health Behavior/Discharge Planning: Goal: Ability to manage health-related needs will improve Outcome: Progressing   Problem: Clinical Measurements: Goal: Ability to maintain clinical measurements within normal limits will improve Outcome: Progressing Goal: Respiratory complications will improve Outcome: Progressing   Problem: Activity: Goal: Risk for activity intolerance will decrease Outcome: Progressing   Problem: Coping: Goal: Level of anxiety will decrease Outcome: Progressing   Problem: Pain Managment: Goal: General experience of comfort will improve Outcome: Progressing   Problem: Safety: Goal: Ability to remain free from injury will improve Outcome: Progressing   Problem: Education: Goal: Knowledge of secondary prevention will improve Outcome: Progressing Goal: Knowledge of patient specific risk factors addressed and post discharge goals established will improve Outcome: Progressing   Problem: Coping: Goal: Will verbalize positive feelings about self Outcome: Progressing   Problem: Health Behavior/Discharge Planning: Goal: Ability to manage health-related needs will improve Outcome: Progressing    Problem: Self-Care: Goal: Ability to communicate needs accurately will improve Outcome: Progressing   Problem: Ischemic Stroke/TIA Tissue Perfusion: Goal: Complications of ischemic stroke/TIA will be minimized Outcome: Progressing   Problem: Spontaneous Subarachnoid Hemorrhage Tissue Perfusion: Goal: Complications of Spontaneous Subarachnoid Hemorrhage will be minimized Outcome: Progressing   Problem: Nutrition: Goal: Adequate nutrition will be maintained Outcome: Not Progressing Note: Poor appetite with bouts of nausea/vomiting this shift.

## 2020-08-31 NOTE — Progress Notes (Signed)
Patient ID: Erica Duncan, female   DOB: 08/18/1950, 70 y.o.   MRN: [id number]  PROGRESS NOTE    Erica Duncan  DDU:[id number] DOB: 1950-04-16 DOA: 08-29-20 PCP: [facility]   Brief Narrative:  70 year old female with history of right frontotemporal stroke in 2021, hypertension, hyperlipidemia, diabetes mellitus type 2 presented with acute onset of left-sided weakness and confusion.  Patient was extremely lethargic on presentation.  On presentation, CT of the head revealed chronic MCA distribution infarct.  Neurology was consulted.  She was exhibiting rhythmic movements of the left leg and left arm concerning for focal seizure activity and she was started on IV Ativan and Keppra.  Assessment & Plan:  Paroxysmal A. fib with RVR -Patient went into A. fib with RVR this morning with heart rates going up to 170s.  Blood pressure on the lower side, will not use metoprolol or Cardizem.  Start IV amiodarone.  I have consulted cardiology.  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  MRI of brain was negative for acute infarct although it was a motion degraded study.  CT cerebral perfusion with contrast was negative for emergent large vessel occlusion -EEG was negative for seizures.  Patient underwent LTM EEG did not show any seizures.  Currently on IV Keppra: Dose decreased by neurology on 08/30/2020 because of drowsiness.  Follow neurology recommendations. -Seizure precautions.  Fall/aspiration precautions -Continue aspirin/statin  Essential hypertension -blood pressure on the lower side.  Antihypertensives on hold.  We will give IV fluids.  Diabetes  mellitus type 2 with hyperglycemia and hypoglycemia -A1c 7.9.  Patient has had hyperglycemia and hypoglycemia.  Continue long-acting insulin.  Continue CBGs with SSI.  Chronic kidney disease stage IIIb -Creatinine improving.  Off IV fluids.  No labs today.  Mild hyponatremia -Improved.  Monitor  Thrombocytopenia -Questionable cause.  Monitor  Hypomagnesemia -Replaced and improved.  No labs today  DVT prophylaxis: Lovenox Code Status: Full Family Communication: Daughter at bedside on 08/31/2020  disposition Plan: Status is: Inpatient  Remains inpatient appropriate because:Inpatient level of care appropriate due to severity of illness  Dispo: The patient is from: Home              Anticipated d/c is to: Home              Patient currently is not medically stable to d/c.   Difficult to place patient No  Consultants: Neurology  Procedures: EEG/LTM EEG  Antimicrobials: None   Subjective: Patient seen and examined at bedside.  Nursing staff reports episodes of tachycardia this morning.  Patient is slightly more awake and answers some questions.  No overnight fever, seizures reported.   Objective: Vitals:   08/30/20 2344 08/31/20 0303 08/31/20 0650 08/31/20 0724  BP: 111/75 108/75 (!) 114/94 108/75  Pulse: 100 99 (!) 170   Resp: (!) 24 (!) 22 (!) 35   Temp: 98.7 F (37.1 C) 98.8 F (37.1 C)  98.6 F (37 C)  TempSrc: Axillary Axillary    SpO2: 94% 98% 98% 98%  Weight:        Intake/Output Summary (Last 24 hours) at 08/31/2020 0738 Last data filed at 08/31/2020 0436 Gross per 24 hour  Intake 303 ml  Output 250 ml  Net 53 ml    Filed Weights   08/28/20 0010  Weight: 60.8 kg    Examination:  General exam: Currently on room air.  No acute distress.  Looks chronically ill and deconditioned.   Respiratory system: Bilateral decreased breath sounds at bases with some scattered crackles  cardiovascular system: S1-S2 heard, tachycardic  gastrointestinal system:  Abdomen is distended mildly, soft and nontender.  Normal bowel sounds heard  extremities: No clubbing, edema  Central nervous system: More awake today, answers some questions.  No focal neurological deficits.  Moving extremities.   Skin: No obvious petechiae/rashes  psychiatry: Could not be assessed because of mental status   Data Reviewed: I have personally reviewed following labs and imaging studies  CBC: Recent Labs  Lab 08/28/20 0001 08/28/20 0012 08/28/20 0801 08/29/20 0415 08/30/20 0433  WBC 6.4  --  6.5 8.9 8.2  NEUTROABS 4.6  --   --  7.4 6.3  HGB 12.5 15.0 12.3 11.6* 12.0  HCT 41.6 44.0 38.8 37.4 37.6  MCV 73.4*  --  71.9* 72.2* 70.9*  PLT 165  --  147* 115* 132*    Basic Metabolic Panel: Recent Labs  Lab 08/28/20 0001 08/28/20 0012 08/28/20 0801 08/29/20 0415 08/30/20 0433  NA 133* 135 134* 138 137  K 4.7 4.7 4.9 4.0 3.9  CL 98 100 98 105 103  CO2 21*  --  22 21* 22  GLUCOSE 339* 343* 354* 123* 183*  BUN 21 22 21  24* 29*  CREATININE 1.74* 1.60* 1.71* 1.66* 1.57*  CALCIUM 9.9  --  9.7 8.9 9.2  MG  --   --   --  1.5* 1.8    GFR: CrCl cannot be calculated (Unknown ideal weight.). Liver Function Tests: Recent Labs  Lab 08/28/20 0001 08/28/20 0801 08/29/20 0415  AST 32 34 31  ALT 19 20 20   ALKPHOS 66 62 49  BILITOT 1.3* 1.4* 1.3*  PROT 7.2 6.7 5.8*  ALBUMIN 3.5 3.2* 2.8*    No results for input(s): LIPASE, AMYLASE in the last 168 hours. Recent Labs  Lab 08/29/20 1220  AMMONIA 28    Coagulation Profile: Recent Labs  Lab 08/28/20 0001  INR 1.3*    Cardiac Enzymes: No results for input(s): CKTOTAL, CKMB, CKMBINDEX, TROPONINI in the last 168 hours. BNP (last 3 results) No results for input(s): PROBNP in the last 8760 hours. HbA1C: Recent Labs    08/28/20 0801  HGBA1C 7.9*    CBG: Recent Labs  Lab 08/30/20 0721 08/30/20 1121 08/30/20 1705 08/30/20 2150 08/31/20 0649  GLUCAP 188* 166* 213* 162* 101*    Lipid  Profile: Recent Labs    08/28/20 0801  CHOL 111  HDL 54  LDLCALC 40  TRIG 87  CHOLHDL 2.1    Thyroid Function Tests: Recent Labs    08/29/20 1220  TSH 0.360    Anemia Panel: Recent Labs    08/30/20 0433  VITAMINB12 721    Sepsis Labs: No results for input(s): PROCALCITON, LATICACIDVEN in the last 168 hours.  Recent Results (from the past 240 hour(s))  SARS CORONAVIRUS 2 (TAT 6-24 HRS) Nasopharyngeal Nasopharyngeal Swab     Status: None   Collection Time: 08/28/20 10:19 AM   Specimen: Nasopharyngeal Swab  Result Value Ref Range Status   SARS Coronavirus 2 NEGATIVE NEGATIVE Final    Comment: (NOTE) SARS-CoV-2 target nucleic acids are NOT DETECTED.  The SARS-CoV-2 RNA is generally detectable in upper and lower respiratory specimens during  the acute phase of infection. Negative results do not preclude SARS-CoV-2 infection, do not rule out co-infections with other pathogens, and should not be used as the sole basis for treatment or other patient management decisions. Negative results must be combined with clinical observations, patient history, and epidemiological information. The expected result is Negative.  Fact Sheet for Patients: HairSlick.no  Fact Sheet for Healthcare Providers: quierodirigir.com  This test is not yet approved or cleared by the [place] FDA and  has been authorized for detection and/or diagnosis of SARS-CoV-2 by FDA under an Emergency Use Authorization (EUA). This EUA will remain  in effect (meaning this test can be used) for the duration of the COVID-19 declaration under Se ction 564(b)(1) of the Act, 21 U.S.C. section 360bbb-3(b)(1), unless the authorization is terminated or revoked sooner.  Performed at [facility], [address] [place], [place] [address]           Radiology Studies: Overnight EEG with video  Result Date: 08/29/2020 Charlsie Quest, MD      08/29/2020  9:59 AM Patient Name: Poet Hineman MRN: [id number] Epilepsy Attending: Charlsie Quest Referring Physician/Provider: Dr Lindie Spruce Duration: 08/28/2020 1419 to 08/29/2020 1000  Patient history: 70 year old female presented with left sided weakness and confusion.  EEG evaluate for seizures.  Level of alertness: Awake, asleep  AEDs during EEG study: LEV  Technical aspects: This EEG study was done with scalp electrodes positioned according to the 10-20 International system of electrode placement. Electrical activity was acquired at a sampling rate of 500Hz  and reviewed with a high frequency filter of 70Hz  and a low frequency filter of 1Hz . EEG data were recorded continuously and digitally stored.  Description: No clear posterior dominant rhythm was seen.Sleep was characterized by vertex waves, sleep spindles (12 to 14 Hz), maximal frontocentral region. EEG showed continuous generalized and lateralized right hemisphere 3 to 5 Hz theta and delta slowing.  Generalized periodic discharges with triphasic morphology at 1Hz  were noted predominantly when patient was awake/stimulated. Hyperventilation and photic stimulation were not performed.    ABNORMALITY -Periodic discharges with triphasic morphology, generalized -Continuous slow, generalized and lateralized right hemisphere  IMPRESSION: This study showed periodic discharges with triphasic morphology at 1 Hz which can be on the ictal-interictal continuum.  However the frequency, morphology and reactivity to stimulation is more commonly seen in toxic-metabolic causes.  Additionally there is evidence of cortical dysfunction in right hemisphere likely secondary to underlying structural abnormality/stroke. There is also moderate diffuse encephalopathy, nonspecific to etiology. No seizures were seen throughout the recording.  Priyanka        Scheduled Meds:  aspirin EC  81 mg Oral Daily   enoxaparin (LOVENOX) injection  40 mg  Subcutaneous Q24H   insulin aspart  0-15 Units Subcutaneous TID WC   insulin aspart  0-5 Units Subcutaneous QHS   insulin glargine-yfgn  15 Units Subcutaneous QHS   rosuvastatin  10 mg Oral Daily   sodium chloride flush  3 mL Intravenous Once   Continuous Infusions:  levETIRAcetam Stopped (08/30/20 1800)   And   levETIRAcetam Stopped (08/31/20 0435)          , MD [facility] 08/31/2020, 7:38 AM

## 2020-08-31 NOTE — Progress Notes (Signed)
Telemetry called to report pt having episodes of tachy arrhythmia since 0607 pt assessed to be asymptomatic at the time. Around [phone] RN was reviewing pt telemetry strip and saw pt HR to be in the 160's, RN went to the room to assess pt, pt was sleeping, MD on-call was notified as well as charge and new order for EKG was received. MD notified of EKG in epic and awaiting on new orders. Reported off to oncoming RN.    08/31/20 0650  Assess: MEWS Score  BP (!) 114/94  Pulse Rate (!) 170  ECG Heart Rate (!) 169  Resp (!) 35  SpO2 98 %  O2 Device Room Air  Assess: MEWS Score  MEWS Temp 0  MEWS Systolic 0  MEWS Pulse 3  MEWS RR 2  MEWS LOC 0  MEWS Score 5  MEWS Score Color Red  Assess: if the MEWS score is Yellow or Red  Were vital signs taken at a resting state? Yes  Focused Assessment Change from prior assessment (see assessment flowsheet)  Early Detection of Sepsis Score *See Row Information* Medium  MEWS guidelines implemented *See Row Information* Yes  Treat  MEWS Interventions Escalated (See documentation below) (MD notified)  Take Vital Signs  Increase Vital Sign Frequency  Red: Q 1hr X 4 then Q 4hr X 4, if remains red, continue Q 4hrs  Escalate  MEWS: Escalate Red: discuss with charge nurse/RN and provider, consider discussing with RRT  Notify: Charge Nurse/RN  Name of Charge Nurse/RN Notified Marcy Salvo  Date Charge Nurse/RN Notified 08/31/20  Time Charge Nurse/RN Notified [phone]  Notify: Provider  Provider Name/Title Dr. Margo Aye  Date Provider Notified 08/31/20  Time Provider Notified [phone]  Notification Type Page  Notification Reason Change in status  Provider response See new orders  Date of Provider Response 08/31/20  Time of Provider Response [phone]

## 2020-08-31 NOTE — Consult Note (Signed)
Name: Erica Duncan MRN: [id number] DOB: 07-Jun-1950    ADMISSION DATE:  08/20/2020 CONSULTATION DATE:  08/31/2020   REFERRING MD :  Bernestine Amass  CHIEF COMPLAINT:  hypotension  BRIEF PATIENT DESCRIPTION: 70 year old diabetic, hypertensive, prior CVA with minimal left residual weakness admitted with focal seizure , developed atrial fibrillation/RVR 8/14 with hypotension not responding to 2 L of fluid  SIGNIFICANT EVENTS  8/11 admitted with focal seizure with left head turn and leftward gaze   STUDIES:  head CT showed old infarct,  MRI negative for acute infarct,  EEG showed epileptogenic city in the left frontal region   HISTORY OF PRESENT ILLNESS: 70 year old Spanish-speaking diabetic, hypertensive with a history of right MCA CVA in 2020 presented with focal seizure with left head turn and leftward gaze.  She has minimal left-sided weakness at baseline.  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 Early morning on 8/14 she developed episodes of tachyarrhythmia with heart rate in the 160s and hypotension.  She was given fluid bolus up to 2 L and amiodarone load and hypotension persisted, and's PCCM consulted.  Patient is Spanish-speaking and history is obtained after chart review and provided by daughter in the room.  Patient denies chest pain but did have palpitations.  She did not report any worsening of her neurologic weakness  PAST MEDICAL HISTORY :  Type 2 diabetes on insulin Hypertension CKD stage IIIb Prior right temporoparietal CVA, minimal left-sided weakness    has a past medical history of Chronic kidney disease, stage 3b (HCC) (08/28/2020), Essential hypertension (08/28/2020), Mixed diabetic hyperlipidemia associated with type 2 diabetes mellitus (HCC) (08/28/2020), and Uncontrolled type 2 diabetes mellitus with hyperglycemia, with long-term current use of insulin (HCC)  (08/28/2020).  has no past surgical history on file. Prior to Admission medications   Medication Sig Start Date End Date Taking? Authorizing Provider  aspirin EC 81 MG tablet Take 81 mg by mouth daily. Swallow whole.   Yes [provider]  glimepiride (AMARYL) 4 MG tablet Take 4 mg by mouth 2 (two) times daily. 07/31/20  Yes [provider]  hydrochlorothiazide (HYDRODIURIL) 25 MG tablet Take 25 mg by mouth daily. 07/31/20  Yes [provider]  JANUMET XR 50-500 MG TB24 Take 1 tablet by mouth daily. 07/31/20  Yes [provider]  LANTUS SOLOSTAR 100 UNIT/ML Solostar Pen Inject 30 Units into the skin at bedtime. 07/31/20  Yes [provider]  lisinopril (ZESTRIL) 40 MG tablet Take 40 mg by mouth daily. 07/31/20  Yes [provider]  Multiple Vitamin (MULTIVITAMIN WITH MINERALS) TABS tablet Take 1 tablet by mouth daily.   Yes [provider]  rosuvastatin (CRESTOR) 10 MG tablet Take 10 mg by mouth daily. 07/31/20  Yes [provider]   No Known Allergies  FAMILY HISTORY:  family history includes Heart attack in her father and mother. SOCIAL HISTORY:  reports that she has never smoked. She has never used smokeless tobacco.  REVIEW OF SYSTEMS:   Constitutional: Negative for fever, chills, weight loss, malaise/fatigue and diaphoresis.  HENT: Negative for hearing loss, ear pain, nosebleeds, congestion, sore throat, neck pain, tinnitus and ear discharge.   Eyes: Negative for blurred vision, double vision, photophobia, pain, discharge and redness.  Respiratory: Negative for cough, hemoptysis, sputum production, shortness of breath, wheezing and stridor.   Cardiovascular: Negative for chest pain, orthopnea, claudication, leg swelling and PND.  Gastrointestinal: Negative for heartburn,  nausea, vomiting, abdominal pain, diarrhea, constipation, blood in stool and melena.  Genitourinary: Negative for dysuria, urgency, frequency, hematuria  and flank pain.  Musculoskeletal: Negative for myalgias, back pain, joint pain and falls.  Skin: Negative for itching and rash.  Neurological: Negative for dizziness, tingling, tremors, sensory change, speech change,loss of consciousness, weakness and headaches.  Endo/Heme/Allergies: Negative for environmental allergies and polydipsia. Does not bruise/bleed easily.  SUBJECTIVE:   VITAL SIGNS: Temp:  [98.2 F (36.8 C)-99.1 F (37.3 C)] 98.2 F (36.8 C) (08/14 0755) Pulse Rate:  [27-170] 65 (08/14 1045) Resp:  [20-35] 23 (08/14 1045) BP: (64-114)/(46-94) 77/59 (08/14 1045) SpO2:  [89 %-100 %] 96 % (08/14 1045)  PHYSICAL EXAMINATION: General: Elderly woman, supine in bed, no distress Neuro: Awake, interactive, power 4/5 all 4 extremities, plantars downgoing bilateral HEENT: No pallor, icterus, JVD + Cardiovascular: S1-S2 regular, sinus on monitor Lungs: Clear to auscultation, no accessory muscle use Abdomen: Soft, nontender abdomen Musculoskeletal: No edema, no deformity Skin: No rash  Recent Labs  Lab 08/29/20 0415 08/30/20 0433 08/31/20 0923  NA 138 137 139  K 4.0 3.9 3.6  CL 105 103 108  CO2 21* 22 19*  BUN 24* 29* 33*  CREATININE 1.66* 1.57* 1.81*  GLUCOSE 123* 183* 119*   Recent Labs  Lab 08/28/20 0801 08/29/20 0415 08/30/20 0433  HGB 12.3 11.6* 12.0  HCT 38.8 37.4 37.6  WBC 6.5 8.9 8.2  PLT 147* 115* 132*   No results found.  EKG on admission shows sinus rhythm, poor R wave progression consistent with old anterior infarct, conduction delay nonspecific. 8/14 shows A. fib RVR, no ST-T wave changes  Chest x-ray 8/14 independently reviewed shows cardiomegaly and interstitial prominence with probable right effusion  ASSESSMENT / PLAN:  Hypotension -seems to be related to tachyarrhythmia and loss of atrial kick , seems to have preceded amnio bolus and this may have also contributed to persistent hypotension -even though she is back in sinus rhythm now -She is  relatively asymptomatic , and MAP is 70 now with systolic around 86 -There is concerned that she is developing JVD so I have stopped her third fluid bolus that was ordered -No reason to suspect sepsis -Echocardiogram has been ordered and amiodarone decreased to 30 mg/h by cardiology  Recommend -Continue to observe in progressive care for now -If hypotension worsens, can transfer to ICU and Neo-Synephrine can be started peripherally rather than more fluids -Await echo findings. -Anticoagulation per cardiology given prior history of stroke   Cyril Mourning MD. FCCP. [facility] Pager : [phone]  If no response to pager , please call [phone] until 7 pm After 7:00 pm call Elink  [phone]     08/31/2020, 11:16 AM

## 2020-08-31 NOTE — Consult Note (Signed)
Cardiology Consultation:   Patient ID: Erica Duncan MRN: [id number]; DOB: [birth year]  Admit date: 09/16/2020 Date of Consult: 08/31/2020  PCP:  [facility]   [facility] Providers Cardiologist:  None        Patient Profile:   Erica Duncan is a 70 y.o. female, spanish speaking with a hx of CVA in 2021, HTN, HLD, IDDM, and now admitted with lt sided weakness and confusion who is being seen 08/31/2020 for the evaluation of tachyarrhythmia at the request of Dr. Hanley BenAlekh.  History of Present Illness:   Ms. Erica Duncan with no old charts here, and hx as above presented to ER 08/28/20 for seizure -one was witnessed in ER.   Per Neuro 1st time seizure likely due to underlying stroke but no new on MRI.  Meds adjusted.  Pt began having rapid HR early today and was A fib with RVR.  She was given IV amiodarone load and now. No prior cardiac issues but both parents with heart attacks in their 50s.   She was aware of rapid HR this AM felt like she was climbing stairs. Now is in SR - she had at least 3 episodes of rapid a fib this AM.  Her BP is in the 80s systolic and she is receiving fluids.  Comfortable lying flat  EKG:  The EKG was personally reviewed and demonstrates:  08/28/20 with SR non specific IVCD, old ant MI today a fib with RVR at 165 no acute ST changes.  Telemetry:  Telemetry was personally reviewed and demonstrates:    Na 137, K+ 3.9, BUN 29 Cr 1.57 Mg+ 1.8  Hgb 12, plts 132 WBC 8.2  TSH 0.360 A1C 7.9 LDL 40, HDL 54 T chol 111  BP 80/62 P 70s Resp 30s afebrile  glucose 101  Past Medical History:  Diagnosis Date   Chronic kidney disease, stage 3b (HCC) 08/28/2020   Essential hypertension 08/28/2020   Mixed diabetic hyperlipidemia associated with type 2 diabetes mellitus (HCC) 08/28/2020   Uncontrolled type 2 diabetes mellitus with hyperglycemia, with long-term current use of insulin (HCC) 08/28/2020    History reviewed. No pertinent  surgical history.   Home Medications:  Prior to Admission medications   Medication Sig Start Date End Date Taking? Authorizing Provider  aspirin EC 81 MG tablet Take 81 mg by mouth daily. Swallow whole.   Yes [provider]  glimepiride (AMARYL) 4 MG tablet Take 4 mg by mouth 2 (two) times daily. 07/31/20  Yes [provider]  hydrochlorothiazide (HYDRODIURIL) 25 MG tablet Take 25 mg by mouth daily. 07/31/20  Yes [provider]  JANUMET XR 50-500 MG TB24 Take 1 tablet by mouth daily. 07/31/20  Yes [provider]  LANTUS SOLOSTAR 100 UNIT/ML Solostar Pen Inject 30 Units into the skin at bedtime. 07/31/20  Yes [provider]  lisinopril (ZESTRIL) 40 MG tablet Take 40 mg by mouth daily. 07/31/20  Yes [provider]  Multiple Vitamin (MULTIVITAMIN WITH MINERALS) TABS tablet Take 1 tablet by mouth daily.   Yes [provider]  rosuvastatin (CRESTOR) 10 MG tablet Take 10 mg by mouth daily. 07/31/20  Yes [provider]    Inpatient Medications: Scheduled Meds:  aspirin EC  81 mg Oral Daily   enoxaparin (LOVENOX) injection  40 mg Subcutaneous Q24H   insulin aspart  0-15 Units Subcutaneous TID WC   insulin aspart  0-5 Units Subcutaneous QHS   insulin glargine-yfgn  15 Units Subcutaneous QHS  rosuvastatin  10 mg Oral Daily   sodium chloride flush  3 mL Intravenous Once   Continuous Infusions:  amiodarone 60 mg/hr (08/31/20 0347)   Followed by   amiodarone     levETIRAcetam Stopped (08/30/20 1800)   And   levETIRAcetam Stopped (08/31/20 0435)   sodium chloride     PRN Meds: acetaminophen **OR** acetaminophen (TYLENOL) oral liquid 160 mg/5 mL **OR** acetaminophen, LORazepam, metoprolol tartrate, ondansetron (ZOFRAN) IV, oxyCODONE, polyethylene glycol, sodium chloride  Allergies:   No Known Allergies  Social History:   Social History   Socioeconomic History   Marital status: Married    Spouse name: Not on file    Number of children: Not on file   Years of education: Not on file   Highest education level: Not on file  Occupational History   Not on file  Tobacco Use   Smoking status: Never   Smokeless tobacco: Never  Substance and Sexual Activity   Alcohol use: Not on file   Drug use: Not on file   Sexual activity: Not on file  Other Topics Concern   Not on file  Social History Narrative   Not on file   Social Determinants of Health   Financial Resource Strain: Not on file  Food Insecurity: Not on file  Transportation Needs: Not on file  Physical Activity: Not on file  Stress: Not on file  Social Connections: Not on file  Intimate Partner Violence: Not on file    Family History:    Family History  Problem Relation Age of Onset   Heart attack Mother    Heart attack Father    Stroke Neg Hx      ROS:  Please see the history of present illness.  General:no colds or fevers, no weight changes Skin:no rashes or ulcers HEENT:no blurred vision, no congestion CV:see HPI PUL:see HPI GI:no diarrhea constipation or melena, no indigestion GU:no hematuria, no dysuria MS:no joint pain, no claudication Neuro:no syncope, no lightheadedness Endo:+ diabetes, no thyroid disease  All other ROS reviewed and negative.     Physical Exam/Data:   Vitals:   08/31/20 0900 08/31/20 0905 08/31/20 0910 08/31/20 0915  BP: (!) 78/62 (!) 73/60 (!) 80/58 (!) 80/62  Pulse: 72 72 66 71  Resp: (!) 31 (!) 22 (!) 31 (!) 33  Temp:      TempSrc:      SpO2: 91% (!) 89% 93% 91%  Weight:        Intake/Output Summary (Last 24 hours) at 08/31/2020 0942 Last data filed at 08/31/2020 0436 Gross per 24 hour  Intake 303 ml  Output 250 ml  Net 53 ml   Last 3 Weights 08/28/2020  Weight (lbs) 134 lb 0.6 oz  Weight (kg) 60.8 kg     There is no height or weight on file to calculate BMI.  General:  Well nourished, well developed, in no acute distress but low BP and mild diaphoresis HEENT: normal Lymph: no  adenopathy Neck: no JVD Endocrine:  No thryomegaly Vascular: No carotid bruits; pedal pulses 2+ bilaterally  Cardiac:  normal S1, S2; RRR; no murmur no gallup or rub Lungs: rales in bases to auscultation bilaterally, no wheezing, rhonchi Abd: soft, nontender, no hepatomegaly  Ext: no edema Musculoskeletal:  No deformities, BUE and BLE strength normal and equal Skin: warm and dry  Neuro:  alert and oriented X 3 ,  Psych:  Normal affect  Daughter in room to translate.  Relevant CV Studies:  None  will order echo  Laboratory Data:  High Sensitivity Troponin:  No results for input(s): TROPONINIHS in the last 720 hours.   Chemistry Recent Labs  Lab 08/28/20 0801 08/29/20 0415 08/30/20 0433  NA 134* 138 137  K 4.9 4.0 3.9  CL 98 105 103  CO2 22 21* 22  GLUCOSE 354* 123* 183*  BUN 21 24* 29*  CREATININE 1.71* 1.66* 1.57*  CALCIUM 9.7 8.9 9.2  GFRNONAA 32* 33* 35*  ANIONGAP 14 12 12     Recent Labs  Lab 08/28/20 0001 08/28/20 0801 08/29/20 0415  PROT 7.2 6.7 5.8*  ALBUMIN 3.5 3.2* 2.8*  AST 32 34 31  ALT 19 20 20   ALKPHOS 66 62 49  BILITOT 1.3* 1.4* 1.3*   Hematology Recent Labs  Lab 08/28/20 0801 08/29/20 0415 08/30/20 0433  WBC 6.5 8.9 8.2  RBC 5.40* 5.18* 5.30*  HGB 12.3 11.6* 12.0  HCT 38.8 37.4 37.6  MCV 71.9* 72.2* 70.9*  MCH 22.8* 22.4* 22.6*  MCHC 31.7 31.0 31.9  RDW 17.0* 16.9* 16.5*  PLT 147* 115* 132*   BNPNo results for input(s): BNP, PROBNP in the last 168 hours.  DDimer No results for input(s): DDIMER in the last 168 hours.   Radiology/Studies:  MR BRAIN WO CONTRAST  Result Date: 08/28/2020 CLINICAL DATA:  Seizure.  Left-sided weakness and confusion EXAM: MRI HEAD WITHOUT CONTRAST TECHNIQUE: Multiplanar, multiecho pulse sequences of the brain and surrounding structures were obtained without intravenous contrast. COMPARISON:  CT head 08/28/2020 FINDINGS: Brain: Motion degraded study. Patient not able to complete the study. No contrast  administered. Negative for acute infarct. Chronic ischemic changes in the white matter bilaterally. Chronic cortical infarct right temporoparietal lobe with chronic hemorrhage. Chronic infarct in the left frontal convexity. Chronic microhemorrhage right cerebellum. Negative for mass lesion. Vascular: Normal arterial flow voids Skull and upper cervical spine: Negative Sinuses/Orbits: Negative Other: None IMPRESSION: Motion degraded study.  The study was terminated prematurely Negative for acute infarct. Electronically Signed   By: Marlan Palau M.D.   On: 08/28/2020 07:09   CT CEREBRAL PERFUSION W CONTRAST  Result Date: 08/28/2020 CLINICAL DATA:  Initial evaluation for acute stroke. EXAM: CT ANGIOGRAPHY HEAD AND NECK TECHNIQUE: Multidetector CT imaging of the head and neck was performed using the standard protocol during bolus administration of intravenous contrast. Multiplanar CT image reconstructions and MIPs were obtained to evaluate the vascular anatomy. Carotid stenosis measurements (when applicable) are obtained utilizing NASCET criteria, using the distal internal carotid diameter as the denominator. CONTRAST:  58mL OMNIPAQUE IOHEXOL 350 MG/ML SOLN COMPARISON:  Prior head CT from earlier the same day. FINDINGS: CTA NECK FINDINGS Aortic arch: Visualized aortic arch normal in caliber with normal branch pattern. Mild atheromatous change about the arch itself. No hemodynamically significant stenosis about the origin the great vessels. Right carotid system: Right common and internal carotid arteries widely patent without stenosis, dissection or occlusion. Left carotid system: Left common and internal carotid arteries widely patent without stenosis, dissection or occlusion. Vertebral arteries: Both vertebral arteries arise from the subclavian arteries. No proximal subclavian artery stenosis. Both vertebral arteries widely patent without stenosis, dissection or occlusion. Skeleton: No visible acute osseous  finding. No discrete or worrisome osseous lesions. Patient is edentulous. Other neck: No other acute soft tissue abnormality within the neck. No mass or adenopathy. Upper chest: Moderate right greater than left layering pleural effusions, partially visualized. Visualized upper chest demonstrates no other acute abnormality. Review of the MIP images confirms the above findings CTA HEAD  FINDINGS Anterior circulation: Petrous segments widely patent. Mild atheromatous change within the carotid siphons without significant stenosis. A1 segments patent bilaterally. Left A1 duplicated, with duplication of the left M1 segment. Normal anterior communicating artery complex. Anterior cerebral arteries patent to their distal aspects without stenosis. No M1 stenosis or occlusion. Normal MCA bifurcations. Distal MCA branches well perfused and symmetric. Posterior circulation: Both V4 segments patent to the vertebrobasilar junction without stenosis. Right PICA origin patent and normal. Left PICA not definitely seen. Basilar patent to its distal aspect without stenosis. Superior cerebellar arteries patent bilaterally. Both PCAs primarily supplied via the basilar well perfused to their distal aspects. Venous sinuses: Grossly patent allowing for timing the contrast bolus. Anatomic variants: None significant.  No aneurysm. Review of the MIP images confirms the above findings IMPRESSION: 1. Negative CTA for emergent large vessel occlusion. 2. Mild atheromatous change about the carotid siphons without hemodynamically significant or correctable stenosis. 3. Moderate right greater than left layering pleural effusions, partially visualized. Results discussed by telephone on 08/28/2020 at approximately 12:50 am with provider Brooke Dare , who verbally acknowledged these results. Electronically Signed   By: Rise Mu M.D.   On: 08/28/2020 01:11   EEG adult  Result Date: 08/28/2020 Charlsie Quest, MD     08/28/2020 11:48 AM  Patient Name: Safiatou Islam MRN: [id number] Epilepsy Attending: Charlsie Quest Referring Physician/Provider: Dr Brooke Dare Date: 08/28/2020 Duration: 23.45 mins Patient history: 71 year old female presented with left sided weakness and confusion.  EEG evaluate for seizures. Level of alertness: Awake, asleep AEDs during EEG study: LEV Technical aspects: This EEG study was done with scalp electrodes positioned according to the 10-20 International system of electrode placement. Electrical activity was acquired at a sampling rate of 500Hz  and reviewed with a high frequency filter of 70Hz  and a low frequency filter of 1Hz . EEG data were recorded continuously and digitally stored. Description: No clear posterior dominant rhythm was seen.Sleep was characterized by vertex waves, sleep spindles (12 to 14 Hz), maximal frontocentral region. EEG showed continuous generalized and lateralized right hemisphere 3 to 5 Hz theta and delta slowing.  Sharp waves were noted in left frontal region. Hyperventilation and photic stimulation were not performed.   ABNORMALITY -Sharp wave, left frontal region -Continuous slow, generalized and lateralized right hemisphere IMPRESSION: This study showed evidence of epileptogenicity arising from left frontal region. Additionally there is evidence of cortical dysfunction in right hemisphere likely secondary to underlying structural abnormality/stroke. There is also moderate diffuse encephalopathy, nonspecific to etiology. No seizures were seen throughout the recording. Priyanka   Overnight EEG with video  Result Date: 08/29/2020 , MD     08/29/2020  9:59 AM Patient Name: Vicenta Olds MRN: Charlsie Quest Epilepsy Attending: 10/29/2020 Referring Physician/Provider: Dr Erica Skinner Duration: 08/28/2020 1419 to 08/29/2020 1000  Patient history: 70 year old female presented with left sided weakness and confusion.  EEG evaluate for seizures.   Level of alertness: Awake, asleep  AEDs during EEG study: LEV  Technical aspects: This EEG study was done with scalp electrodes positioned according to the 10-20 International system of electrode placement. Electrical activity was acquired at a sampling rate of 500Hz  and reviewed with a high frequency filter of 70Hz  and a low frequency filter of 1Hz . EEG data were recorded continuously and digitally stored.  Description: No clear posterior dominant rhythm was seen.Sleep was characterized by vertex waves, sleep spindles (12 to 14 Hz), maximal frontocentral region. EEG showed continuous  generalized and lateralized right hemisphere 3 to 5 Hz theta and delta slowing.  Generalized periodic discharges with triphasic morphology at 1Hz  were noted predominantly when patient was awake/stimulated. Hyperventilation and photic stimulation were not performed.    ABNORMALITY -Periodic discharges with triphasic morphology, generalized -Continuous slow, generalized and lateralized right hemisphere  IMPRESSION: This study showed periodic discharges with triphasic morphology at 1 Hz which can be on the ictal-interictal continuum.  However the frequency, morphology and reactivity to stimulation is more commonly seen in toxic-metabolic causes.  Additionally there is evidence of cortical dysfunction in right hemisphere likely secondary to underlying structural abnormality/stroke. There is also moderate diffuse encephalopathy, nonspecific to etiology. No seizures were seen throughout the recording.  Charlsie Quest   CT HEAD CODE STROKE WO CONTRAST  Result Date: 08/28/2020 CLINICAL DATA:  Code stroke. Initial evaluation for acute left-sided weakness. EXAM: CT HEAD WITHOUT CONTRAST TECHNIQUE: Contiguous axial images were obtained from the base of the skull through the vertex without intravenous contrast. COMPARISON:  None available. FINDINGS: Brain: Age-related cerebral atrophy with chronic microvascular ischemic disease.  Encephalomalacia involving the right insula and posterior right temporoparietal region consistent with a chronic right MCA distribution infarct. No acute intracranial hemorrhage. No visible acute large vessel territory infarct. No mass lesion or midline shift. No hydrocephalus or extra-axial fluid collection. Vascular: No hyperdense vessel. Scattered vascular calcifications noted within the carotid siphons. Skull: Scalp soft tissues and calvarium within normal limits. Sinuses/Orbits: Globes and orbital soft tissues within normal limits. Paranasal sinuses are clear. No mastoid effusion. Other: None. ASPECTS [facility] Stroke Program Early CT Score) - Ganglionic level infarction (caudate, lentiform nuclei, internal capsule, insula, M1-M3 cortex): 7 - Supraganglionic infarction (M4-M6 cortex): 3 Total score (0-10 with 10 being normal): 10 IMPRESSION: 1. No acute intracranial infarct or other abnormality. 2. ASPECTS is 10. 3. 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 4. Underlying atrophy with moderate chronic microvascular ischemic disease. These results were communicated to Dr. Iver Nestle at 12:24 am on 08/28/2020 by text page via the [facility] messaging system. Electronically Signed   By: Rise Mu M.D.   On: 08/28/2020 00:26   CT ANGIO HEAD NECK W WO CM (CODE STROKE)  Result Date: 08/28/2020 CLINICAL DATA:  Initial evaluation for acute stroke. EXAM: CT ANGIOGRAPHY HEAD AND NECK TECHNIQUE: Multidetector CT imaging of the head and neck was performed using the standard protocol during bolus administration of intravenous contrast. Multiplanar CT image reconstructions and MIPs were obtained to evaluate the vascular anatomy. Carotid stenosis measurements (when applicable) are obtained utilizing NASCET criteria, using the distal internal carotid diameter as the denominator. CONTRAST:  75mL OMNIPAQUE IOHEXOL 350 MG/ML SOLN COMPARISON:  Prior head CT from earlier the  same day. FINDINGS: CTA NECK FINDINGS Aortic arch: Visualized aortic arch normal in caliber with normal branch pattern. Mild atheromatous change about the arch itself. No hemodynamically significant stenosis about the origin the great vessels. Right carotid system: Right common and internal carotid arteries widely patent without stenosis, dissection or occlusion. Left carotid system: Left common and internal carotid arteries widely patent without stenosis, dissection or occlusion. Vertebral arteries: Both vertebral arteries arise from the subclavian arteries. No proximal subclavian artery stenosis. Both vertebral arteries widely patent without stenosis, dissection or occlusion. Skeleton: No visible acute osseous finding. No discrete or worrisome osseous lesions. Patient is edentulous. Other neck: No other acute soft tissue abnormality within the neck. No mass or adenopathy. Upper chest: Moderate right greater than left layering pleural effusions,  partially visualized. Visualized upper chest demonstrates no other acute abnormality. Review of the MIP images confirms the above findings CTA HEAD FINDINGS Anterior circulation: Petrous segments widely patent. Mild atheromatous change within the carotid siphons without significant stenosis. A1 segments patent bilaterally. Left A1 duplicated, with duplication of the left M1 segment. Normal anterior communicating artery complex. Anterior cerebral arteries patent to their distal aspects without stenosis. No M1 stenosis or occlusion. Normal MCA bifurcations. Distal MCA branches well perfused and symmetric. Posterior circulation: Both V4 segments patent to the vertebrobasilar junction without stenosis. Right PICA origin patent and normal. Left PICA not definitely seen. Basilar patent to its distal aspect without stenosis. Superior cerebellar arteries patent bilaterally. Both PCAs primarily supplied via the basilar well perfused to their distal aspects. Venous sinuses: Grossly  patent allowing for timing the contrast bolus. Anatomic variants: None significant.  No aneurysm. Review of the MIP images confirms the above findings IMPRESSION: 1. Negative CTA for emergent large vessel occlusion. 2. Mild atheromatous change about the carotid siphons without hemodynamically significant or correctable stenosis. 3. Moderate right greater than left layering pleural effusions, partially visualized. Results discussed by telephone on 08/28/2020 at approximately 12:50 am with provider Brooke Dare , who verbally acknowledged these results. Electronically Signed   By: Rise Mu M.D.   On: 08/28/2020 01:11     Assessment and Plan:   PAF new- placed on IV amiodarone.  Will check echo -admitted with new onset seizure and new CVA - will check  if anticoagulation ok  though MRI no new ischemia  CHA2DS2VASC of 6 , continue amiodarone - will begin eliquis or heparin  no known CAD per family in pt.  No complaints of chest pain- will defer anticoagulation choice to MD check CXR today as well.  New seizure  on Keppra . Hx of [place] in [place]  HLD on crestor IDDM-2 glucose to 101 this AM per IM Hypotension/HTN  receiving fluids, not sure if due to amiodarone.  CKD 3b monitor   Risk Assessment/Risk Scores:          CHA2DS2-VASc Score = 6  This indicates a 9.7% annual risk of stroke. The patient's score is based upon: CHF History: No HTN History: Yes Diabetes History: Yes Stroke History: Yes Vascular Disease History: No Age Score: 1 Gender Score: 1        For questions or updates, please contact [facility] Please consult [email or web address] for contact info under    Signed, Nada Boozer, NP  08/31/2020 9:42 AM

## 2020-08-31 NOTE — Progress Notes (Signed)
Neurology Progress Note  Brief HPI: 70 y.o. spanish-speaking female with PMHx of right tempopartietal stroke, DM2, HTN, and HLD who presented to the ED 08/28/2020 for evaluation of seizure-like episodes with a focal seizure witnessed in the ED with left head turn and leftward gaze with left hemiplegia and intermittent left gaze deviation.   Subjective: Patient with telemetry evidence of paroxysmal atrial fibrillation with RVR this morning with hear rate elevation to the 160's. She was started on IV amiodarone.  Patient remains lethargic on examination this morning but does not require significant stimulation for participation as she has on previous examinations  Patient complains of feeling fatigued   Exam: Vitals:   08/31/20 1040 08/31/20 1045  BP: (!) 74/60 (!) 77/59  Pulse: (!) 58 65  Resp: (!) 24 (!) 23  Temp:    SpO2: 95% 96%   Gen: Laying in bed with eyes closed, family at bedside, in no acute distress Cardiac: Regular rate on cardiac monitor with hypotensive blood pressures  Resp: non-labored breathing, no respiratory distress on room air Abd: soft, non-tender, non-distended  Neuro: Mental Status: Sleeping in bed, drowsy, opens eyes slowly to voice.  Follows simple commands with family interpreting at bedside.   Mainly keeps her eyes closed but is able to respond with consistent verbal stimulation.  She is oriented to self, age, place, and time. She states "something happened" when asked what brought her to the hospital.  She is unable to provide a clear and coherent history of present illness.  Cranial Nerves: PERRL with active resistance to examiner eye opening, will fixate briefly on examiner with brief eye opening but does not track, face appears symmetric resting and with movement, hearing is intact to voice, head is grossly midline, phonation intact, palate elevates symmetrically, tongue protrudes midline.  Motor: 4/5 strength with some weakness noted throughout without  noted asymmetry; strength assessment is limited by patient drowsiness and effort. She is able to initiate antigravity movements throughout but does not sustain the movements due to drowsiness.  Tone is normal, bulk is normal.  Sensory: Grimaces and withdraws each extremity with application of noxious stimuli throughout. Plantars: Toes downgoing bilaterally  Gait: Deferred  Pertinent Labs: CBC    Component Value Date/Time   WBC 8.2 08/30/2020 0433   RBC 5.30 (H) 08/30/2020 0433   HGB 12.0 08/30/2020 0433   HCT 37.6 08/30/2020 0433   PLT 132 (L) 08/30/2020 0433   MCV 70.9 (L) 08/30/2020 0433   MCH 22.6 (L) 08/30/2020 0433   MCHC 31.9 08/30/2020 0433   RDW 16.5 (H) 08/30/2020 0433   LYMPHSABS 1.0 08/30/2020 0433   MONOABS 0.8 08/30/2020 0433   EOSABS 0.0 08/30/2020 0433   BASOSABS 0.0 08/30/2020 0433   CMP     Component Value Date/Time   NA 139 08/31/2020 0923   K 3.6 08/31/2020 0923   CL 108 08/31/2020 0923   CO2 19 (L) 08/31/2020 0923   GLUCOSE 119 (H) 08/31/2020 0923   BUN 33 (H) 08/31/2020 0923   CREATININE 1.81 (H) 08/31/2020 0923   CALCIUM 8.5 (L) 08/31/2020 0923   PROT 5.8 (L) 08/29/2020 0415   ALBUMIN 2.8 (L) 08/29/2020 0415   AST 31 08/29/2020 0415   ALT 20 08/29/2020 0415   ALKPHOS 49 08/29/2020 0415   BILITOT 1.3 (H) 08/29/2020 0415   GFRNONAA 30 (L) 08/31/2020 0923   Imaging Reviewed: MRI brain without contrast 08/28/2020: Motion degraded study.  The study was terminated prematurely Negative for acute infarct.   CT  Head WO contrast 08/28/2020: 1. No acute intracranial infarct or other abnormality. 2. ASPECTS is 10. 3. 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 4. Underlying atrophy with moderate chronic microvascular ischemic disease.   CT angio head and neck with CT cerebral perfusion [facility] 08/28/2020: 1. Negative CTA for emergent large vessel occlusion. 2. Mild atheromatous change about the carotid siphons  without hemodynamically significant or correctable stenosis. 3. Moderate right greater than left layering pleural effusions, partially visualized.   Routine EEG: "This study showed evidence of epileptogenicity arising from left frontal region. Additionally there is evidence of cortical dysfunction in right hemisphere likely secondary to underlying structural abnormality/stroke. There is also moderate diffuse encephalopathy, nonspecific to etiology. No seizures were seen throughout the recording."   Overnight EEG 08/28/2020 - 08/29/2020: "This study showed periodic discharges with triphasic morphology at 1 Hz which can be on the ictal-interictal continuum.  However the frequency, morphology and reactivity to stimulation is more commonly seen in toxic-metabolic causes.  Additionally there is evidence of cortical dysfunction in right hemisphere likely secondary to underlying structural abnormality/stroke. There is also moderate diffuse encephalopathy, nonspecific to etiology. No seizures were seen throughout the recording."  Assessment: 70 year old with past history of right temporal parietal infarct with residual left-sided weakness, multiple other prior infarcts presenting with first-time seizure that broke with benzodiazepine and antiepileptics. Remains somewhat somnolent Keppra reduced to 250 mg in the mornings and 500 mg BID to evaluate whether AED is causing sedation. Intermittently patient is more awake and interactive.  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 Chronic stroke Altered mental status with decreased level of consciousness Newly identified atrial fibrillation with  rapid ventricular response on cardiac monitor  Recommendations: - Due to persistent lethargy despite decrease in Keppra AM dosing, further reduced Keppra to 250 mg BID  - If minimal or no response, will consider transition to a different AED and evaluate for improvement in level of consciousness - Continue inpatient seizure precautions - 2mg  Ativan PRN clinical seizure activity lasting > 5 minutes and notify neurology  - Management of toxic metabolic comorbidities per primary team - Management of tachyarrhythmia per cardiology along with [facility] recommendations for further stroke prophylaxis   [facility], AGACNP-BC [facility] [phone]  Attending Neurohospitalist Addendum Patient seen and examined with APP/Resident. Agree with the history and physical as documented above. Agree with the plan as documented, which I helped formulate. I have independently reviewed the chart, obtained history, review of systems and examined the patient.I have personally reviewed pertinent head/neck/spine imaging (CT/MRI). Please feel free to call with any questions.  -- [phone], MD Neurologist [facility] Pager: [phone]

## 2020-08-31 NOTE — Progress Notes (Signed)
Received a call due to concern for tachyarrhythmia, strips saved by bedside RN.  Telemetry reported episode occurred x3 since 0607 this morning on 08/31/20.    Would benefit from cardiology input.  Defer to dayshift team to consult cardiology.

## 2020-08-31 NOTE — Significant Event (Signed)
Rapid Response Event Note   Reason for Call :  Rapid A-fib and hypotension  Initial Focused Assessment:  It was reported that the patient went into rapid a-fib overnight with rates in the 160's-170's. Patient remained in that rhythm overnight. MD ordered lopressor initially, but the patient's BP was soft. Fluid bolus started before initiation of Amio. Orders given for Amiodarone bolus and gtt. Patient has converted back and forth between sinus rhythm and a-fib while I was assessing her. She denies chest pain but endorses that she can feel her heart racing. She denies any history of tachy arrhythmia.   The patient became more hypotensive after the amio bolus was completed. MD ordered another liter of NS bolus. Patient continues to go in and out of NSR and a-fib.   Vitals (0815) BP 64/49 HR 118  RR 25 O2 90  Vitals (0930) BP 84/60 HR 71 RR 28 O294  Interventions:  Amio bolus and gtt administered Fluid bolus' administered Vitals taken   Plan of Care:  Patient will remain on 3W Until BP normalizes, continue to check BP frequently. Cardiology will start anticoagulation   Event Summary:   MD Notified: Dr. Hanley Ben Call Time: 0740 Arrival Time: 0742 End Time: 0930  Andrey Spearman, RN

## 2020-09-01 ENCOUNTER — Inpatient Hospital Stay (HOSPITAL_COMMUNITY): Payer: Medicare HMO

## 2020-09-01 DIAGNOSIS — I69398 Other sequelae of cerebral infarction: Secondary | ICD-10-CM | POA: Diagnosis not present

## 2020-09-01 DIAGNOSIS — R570 Cardiogenic shock: Secondary | ICD-10-CM | POA: Diagnosis not present

## 2020-09-01 DIAGNOSIS — N179 Acute kidney failure, unspecified: Secondary | ICD-10-CM | POA: Diagnosis not present

## 2020-09-01 DIAGNOSIS — E872 Acidosis: Secondary | ICD-10-CM

## 2020-09-01 DIAGNOSIS — G40909 Epilepsy, unspecified, not intractable, without status epilepticus: Principal | ICD-10-CM

## 2020-09-01 DIAGNOSIS — R748 Abnormal levels of other serum enzymes: Secondary | ICD-10-CM

## 2020-09-01 DIAGNOSIS — I4891 Unspecified atrial fibrillation: Secondary | ICD-10-CM | POA: Diagnosis not present

## 2020-09-01 LAB — CBC WITH DIFFERENTIAL/PLATELET
Abs Immature Granulocytes: 0.03 10*3/uL (ref 0.00–0.07)
Basophils Absolute: 0 10*3/uL (ref 0.0–0.1)
Basophils Relative: 0 %
Eosinophils Absolute: 0 10*3/uL (ref 0.0–0.5)
Eosinophils Relative: 0 %
HCT: 46.2 % — ABNORMAL HIGH (ref 36.0–46.0)
Hemoglobin: 14.2 g/dL (ref 12.0–15.0)
Immature Granulocytes: 0 %
Lymphocytes Relative: 9 %
Lymphs Abs: 0.8 10*3/uL (ref 0.7–4.0)
MCH: 22.5 pg — ABNORMAL LOW (ref 26.0–34.0)
MCHC: 30.7 g/dL (ref 30.0–36.0)
MCV: 73.1 fL — ABNORMAL LOW (ref 80.0–100.0)
Monocytes Absolute: 0.8 10*3/uL (ref 0.1–1.0)
Monocytes Relative: 8 %
Neutro Abs: 8 10*3/uL — ABNORMAL HIGH (ref 1.7–7.7)
Neutrophils Relative %: 83 %
Platelets: 134 10*3/uL — ABNORMAL LOW (ref 150–400)
RBC: 6.32 MIL/uL — ABNORMAL HIGH (ref 3.87–5.11)
RDW: 18.6 % — ABNORMAL HIGH (ref 11.5–15.5)
WBC: 9.6 10*3/uL (ref 4.0–10.5)
nRBC: 0.2 % (ref 0.0–0.2)

## 2020-09-01 LAB — URINALYSIS, ROUTINE W REFLEX MICROSCOPIC
Bilirubin Urine: NEGATIVE
Glucose, UA: NEGATIVE mg/dL
Hgb urine dipstick: NEGATIVE
Hyphae Yeast: NONE SEEN
Ketones, ur: NEGATIVE mg/dL
Leukocytes,Ua: NEGATIVE
Nitrite: NEGATIVE
Protein, ur: 100 mg/dL — AB
Specific Gravity, Urine: 1.026 (ref 1.005–1.030)
pH: 5 (ref 5.0–8.0)

## 2020-09-01 LAB — ECHOCARDIOGRAM COMPLETE
AR max vel: 1.39 cm2
AV Area VTI: 1.25 cm2
AV Area mean vel: 1.28 cm2
AV Mean grad: 5 mmHg
AV Peak grad: 9.5 mmHg
Ao pk vel: 1.55 m/s
MV M vel: 4.02 m/s
MV Peak grad: 64.6 mmHg
MV VTI: 0.64 cm2
MV Vena cont: 0.3 cm
Radius: 0.7 cm
S' Lateral: 5.1 cm
Single Plane A4C EF: 31.1 %
Weight: 2144.63 oz

## 2020-09-01 LAB — POCT I-STAT 7, (LYTES, BLD GAS, ICA,H+H)
Acid-base deficit: 15 mmol/L — ABNORMAL HIGH (ref 0.0–2.0)
Acid-base deficit: 6 mmol/L — ABNORMAL HIGH (ref 0.0–2.0)
Bicarbonate: 8.8 mmol/L — ABNORMAL LOW (ref 20.0–28.0)
Bicarbonate: 17.6 mmol/L — ABNORMAL LOW (ref 20.0–28.0)
Calcium, Ion: 1.15 mmol/L (ref 1.15–1.40)
Calcium, Ion: 1.21 mmol/L (ref 1.15–1.40)
HCT: 43 % (ref 36.0–46.0)
HCT: 40 % (ref 36.0–46.0)
Hemoglobin: 14.6 g/dL (ref 12.0–15.0)
Hemoglobin: 13.6 g/dL (ref 12.0–15.0)
O2 Saturation: 96 %
O2 Saturation: 95 %
Patient temperature: 97.5
Patient temperature: 97.6
Potassium: 4.6 mmol/L (ref 3.5–5.1)
Potassium: 3.9 mmol/L (ref 3.5–5.1)
Sodium: 134 mmol/L — ABNORMAL LOW (ref 135–145)
Sodium: 135 mmol/L (ref 135–145)
TCO2: 9 mmol/L — ABNORMAL LOW (ref 22–32)
TCO2: 18 mmol/L — ABNORMAL LOW (ref 22–32)
pCO2 arterial: 17.7 mmHg — CL (ref 32.0–48.0)
pCO2 arterial: 27.7 mmHg — ABNORMAL LOW (ref 32.0–48.0)
pH, Arterial: 7.302 — ABNORMAL LOW (ref 7.350–7.450)
pH, Arterial: 7.407 (ref 7.350–7.450)
pO2, Arterial: 88 mmHg (ref 83.0–108.0)
pO2, Arterial: 74 mmHg — ABNORMAL LOW (ref 83.0–108.0)

## 2020-09-01 LAB — COMPREHENSIVE METABOLIC PANEL
ALT: 410 U/L — ABNORMAL HIGH (ref 0–44)
ALT: 651 U/L — ABNORMAL HIGH (ref 0–44)
AST: 889 U/L — ABNORMAL HIGH (ref 15–41)
AST: 1418 U/L — ABNORMAL HIGH (ref 15–41)
Albumin: 2.5 g/dL — ABNORMAL LOW (ref 3.5–5.0)
Albumin: 2.4 g/dL — ABNORMAL LOW (ref 3.5–5.0)
Alkaline Phosphatase: 229 U/L — ABNORMAL HIGH (ref 38–126)
Alkaline Phosphatase: 191 U/L — ABNORMAL HIGH (ref 38–126)
Anion gap: 19 — ABNORMAL HIGH (ref 5–15)
Anion gap: 17 — ABNORMAL HIGH (ref 5–15)
BUN: 47 mg/dL — ABNORMAL HIGH (ref 8–23)
BUN: 52 mg/dL — ABNORMAL HIGH (ref 8–23)
CO2: 10 mmol/L — ABNORMAL LOW (ref 22–32)
CO2: 15 mmol/L — ABNORMAL LOW (ref 22–32)
Calcium: 8.8 mg/dL — ABNORMAL LOW (ref 8.9–10.3)
Calcium: 8.6 mg/dL — ABNORMAL LOW (ref 8.9–10.3)
Chloride: 107 mmol/L (ref 98–111)
Chloride: 106 mmol/L (ref 98–111)
Creatinine, Ser: 2.27 mg/dL — ABNORMAL HIGH (ref 0.44–1.00)
Creatinine, Ser: 2.81 mg/dL — ABNORMAL HIGH (ref 0.44–1.00)
GFR, Estimated: 23 mL/min — ABNORMAL LOW (ref >60)
GFR, Estimated: 17 mL/min — ABNORMAL LOW (ref >60)
Glucose, Bld: 127 mg/dL — ABNORMAL HIGH (ref 70–99)
Glucose, Bld: 196 mg/dL — ABNORMAL HIGH (ref 70–99)
Potassium: 4.7 mmol/L (ref 3.5–5.1)
Potassium: 4.2 mmol/L (ref 3.5–5.1)
Sodium: 136 mmol/L (ref 135–145)
Sodium: 138 mmol/L (ref 135–145)
Total Bilirubin: 2.2 mg/dL — ABNORMAL HIGH (ref 0.3–1.2)
Total Bilirubin: 2.3 mg/dL — ABNORMAL HIGH (ref 0.3–1.2)
Total Protein: 5.9 g/dL — ABNORMAL LOW (ref 6.5–8.1)
Total Protein: 5.5 g/dL — ABNORMAL LOW (ref 6.5–8.1)

## 2020-09-01 LAB — COOXEMETRY PANEL
Carboxyhemoglobin: 0.7 % (ref 0.5–1.5)
Carboxyhemoglobin: 0.9 % (ref 0.5–1.5)
Methemoglobin: 0.9 % (ref 0.0–1.5)
Methemoglobin: 0.9 % (ref 0.0–1.5)
O2 Saturation: 41 %
O2 Saturation: 58.7 %
Total hemoglobin: 13 g/dL (ref 12.0–16.0)
Total hemoglobin: 12.4 g/dL (ref 12.0–16.0)

## 2020-09-01 LAB — GLUCOSE, CAPILLARY
Glucose-Capillary: 135 mg/dL — ABNORMAL HIGH (ref 70–99)
Glucose-Capillary: 121 mg/dL — ABNORMAL HIGH (ref 70–99)
Glucose-Capillary: 114 mg/dL — ABNORMAL HIGH (ref 70–99)
Glucose-Capillary: 136 mg/dL — ABNORMAL HIGH (ref 70–99)
Glucose-Capillary: 263 mg/dL — ABNORMAL HIGH (ref 70–99)
Glucose-Capillary: 232 mg/dL — ABNORMAL HIGH (ref 70–99)

## 2020-09-01 LAB — BASIC METABOLIC PANEL
Anion gap: 13 (ref 5–15)
BUN: 60 mg/dL — ABNORMAL HIGH (ref 8–23)
CO2: 18 mmol/L — ABNORMAL LOW (ref 22–32)
Calcium: 8.4 mg/dL — ABNORMAL LOW (ref 8.9–10.3)
Chloride: 102 mmol/L (ref 98–111)
Creatinine, Ser: 2.72 mg/dL — ABNORMAL HIGH (ref 0.44–1.00)
GFR, Estimated: 18 mL/min — ABNORMAL LOW (ref >60)
Glucose, Bld: 277 mg/dL — ABNORMAL HIGH (ref 70–99)
Potassium: 3.7 mmol/L (ref 3.5–5.1)
Sodium: 133 mmol/L — ABNORMAL LOW (ref 135–145)

## 2020-09-01 LAB — HEPARIN LEVEL (UNFRACTIONATED): Heparin Unfractionated: 0.51 IU/mL (ref 0.30–0.70)

## 2020-09-01 LAB — LACTIC ACID, PLASMA
Lactic Acid, Venous: 4.8 mmol/L (ref 0.5–1.9)
Lactic Acid, Venous: 2.7 mmol/L (ref 0.5–1.9)
Lactic Acid, Venous: 2.3 mmol/L (ref 0.5–1.9)

## 2020-09-01 LAB — FERRITIN: Ferritin: 599 ng/mL — ABNORMAL HIGH (ref 11–307)

## 2020-09-01 LAB — MAGNESIUM: Magnesium: 1.8 mg/dL (ref 1.7–2.4)

## 2020-09-01 LAB — IRON AND TIBC
Iron: 16 ug/dL — ABNORMAL LOW (ref 28–170)
Saturation Ratios: 7 % — ABNORMAL LOW (ref 10.4–31.8)
TIBC: 230 ug/dL — ABNORMAL LOW (ref 250–450)
UIBC: 214 ug/dL

## 2020-09-01 LAB — AMMONIA: Ammonia: 73 umol/L — ABNORMAL HIGH (ref 9–35)

## 2020-09-01 MED ORDER — FUROSEMIDE 10 MG/ML IJ SOLN
80.0000 mg | Freq: Two times a day (BID) | INTRAMUSCULAR | Status: DC
  Administered 2020-09-01 – 2020-09-03 (×5): 80 mg via INTRAVENOUS
  Filled 2020-09-01 (×5): qty 8

## 2020-09-01 MED ORDER — NOREPINEPHRINE 16 MG/250ML-% IV SOLN
0.0000 ug/min | INTRAVENOUS | Status: DC
  Administered 2020-09-01: 2 ug/min via INTRAVENOUS
  Administered 2020-09-02: 20 ug/min via INTRAVENOUS
  Administered 2020-09-03: 38 ug/min via INTRAVENOUS
  Administered 2020-09-03: 39 ug/min via INTRAVENOUS
  Administered 2020-09-03: 36 ug/min via INTRAVENOUS
  Administered 2020-09-04: 35 ug/min via INTRAVENOUS
  Administered 2020-09-04: 30 ug/min via INTRAVENOUS
  Administered 2020-09-04: 32 ug/min via INTRAVENOUS
  Administered 2020-09-05: 38 ug/min via INTRAVENOUS
  Administered 2020-09-05: 36 ug/min via INTRAVENOUS
  Administered 2020-09-06 (×3): 40 ug/min via INTRAVENOUS
  Administered 2020-09-07: 24 ug/min via INTRAVENOUS
  Administered 2020-09-07: 30 ug/min via INTRAVENOUS
  Administered 2020-09-08: 27 ug/min via INTRAVENOUS
  Administered 2020-09-08: 25 ug/min via INTRAVENOUS
  Administered 2020-09-09: 18 ug/min via INTRAVENOUS
  Filled 2020-09-01 (×18): qty 250

## 2020-09-01 MED ORDER — SODIUM CHLORIDE 0.9% FLUSH
10.0000 mL | Freq: Two times a day (BID) | INTRAVENOUS | Status: DC
Start: 2020-09-01 — End: 2020-09-09
  Administered 2020-09-01 – 2020-09-08 (×14): 10 mL

## 2020-09-01 MED ORDER — AMIODARONE IV BOLUS ONLY 150 MG/100ML
150.0000 mg | Freq: Once | INTRAVENOUS | Status: AC
  Administered 2020-09-01: 150 mg via INTRAVENOUS

## 2020-09-01 MED ORDER — HEPARIN (PORCINE) 25000 UT/250ML-% IV SOLN
800.0000 [IU]/h | INTRAVENOUS | Status: DC
  Administered 2020-09-01 – 2020-09-02 (×2): 850 [IU]/h via INTRAVENOUS
  Filled 2020-09-01 (×3): qty 250

## 2020-09-01 MED ORDER — SODIUM CHLORIDE 0.9% FLUSH: 10.0000 mL | INTRAVENOUS | Status: DC | PRN

## 2020-09-01 MED ORDER — CHLORHEXIDINE GLUCONATE CLOTH 2 % EX PADS
6.0000 | MEDICATED_PAD | Freq: Every day | CUTANEOUS | Status: DC
  Administered 2020-09-01 – 2020-09-08 (×8): 6 via TOPICAL

## 2020-09-01 MED ORDER — MILRINONE LACTATE IN DEXTROSE 20-5 MG/100ML-% IV SOLN
0.3750 ug/kg/min | INTRAVENOUS | Status: DC
  Administered 2020-09-01: 0.375 ug/kg/min via INTRAVENOUS
  Administered 2020-09-01: 0.25 ug/kg/min via INTRAVENOUS
  Administered 2020-09-02 – 2020-09-03 (×3): 0.375 ug/kg/min via INTRAVENOUS
  Filled 2020-09-01 (×5): qty 100

## 2020-09-01 MED ORDER — METOPROLOL TARTRATE 5 MG/5ML IV SOLN: 2.5000 mg | INTRAVENOUS | Status: DC | PRN

## 2020-09-01 MED ORDER — SODIUM CHLORIDE 0.9 % IV SOLN: INTRAVENOUS | Status: DC | PRN

## 2020-09-01 MED ORDER — NOREPINEPHRINE 4 MG/250ML-% IV SOLN
INTRAVENOUS | Status: AC
  Filled 2020-09-01: qty 250

## 2020-09-01 MED ORDER — METOCLOPRAMIDE HCL 5 MG/ML IJ SOLN: 10.0000 mg | Freq: Four times a day (QID) | INTRAMUSCULAR | Status: DC | PRN

## 2020-09-01 MED ORDER — LACTULOSE 10 GM/15ML PO SOLN
20.0000 g | Freq: Three times a day (TID) | ORAL | Status: DC
  Administered 2020-09-01 – 2020-09-02 (×4): 20 g via ORAL
  Filled 2020-09-01 (×5): qty 30

## 2020-09-01 NOTE — Procedures (Signed)
Arterial Catheter Insertion Procedure Note  Jaylin Roundy  [id number]  [birth year]  Date:09/01/20  Time:6:32 PM    Provider Performing: Derinda Late    Procedure: Insertion of Arterial Line ([id number]) without US guidance  Indication(s) Blood pressure monitoring and/or need for frequent ABGs  Consent Risks of the procedure as well as the alternatives and risks of each were explained to the patient and/or caregiver.  Consent for the procedure was obtained and is signed in the bedside chart  Anesthesia None   Time Out Verified patient identification, verified procedure, site/side was marked, verified correct patient position, special equipment/implants available, medications/allergies/relevant history reviewed, required imaging and test results available.   Sterile Technique Maximal sterile technique including full sterile barrier drape, hand hygiene, sterile gown, sterile gloves, mask, hair covering, sterile ultrasound probe cover (if used).   Procedure Description Area of catheter insertion was cleaned with chlorhexidine and draped in sterile fashion. Without real-time ultrasound guidance an arterial catheter was placed into the right radial artery.  Appropriate arterial tracings confirmed on monitor.     Complications/Tolerance None; patient tolerated the procedure well.   EBL Minimal   Specimen(s) None

## 2020-09-01 NOTE — Progress Notes (Signed)
ANTICOAGULATION CONSULT NOTE - Initial Consult  Pharmacy Consult for heparin Indication: atrial fibrillation  No Known Allergies  Patient Measurements: Weight: 60.8 kg (134 lb 0.6 oz) Heparin Dosing Weight: 60kg  Vital Signs: Temp: 97.4 F (36.3 C) (08/15 0943) Temp Source: Rectal (08/15 0943) BP: 104/58 (08/15 0947) Pulse Rate: 62 (08/15 0947)  Labs: Recent Labs    08/30/20 0433 08/31/20 0923 09/01/20 0337 09/01/20 0843  HGB 12.0  --  14.2 14.6  HCT 37.6  --  46.2* 43.0  PLT 132*  --  134*  --   CREATININE 1.57* 1.81* 2.27*  --     CrCl cannot be calculated (Unknown ideal weight.).   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 Swallow whole.   2020/09/08   glimepiride (AMARYL) 4 MG tablet Take 4 mg by mouth 2 (two) times daily.   09-08-2020   hydrochlorothiazide (HYDRODIURIL) 25 MG tablet Take 25 mg by mouth daily.   09/08/20   JANUMET XR 50-500 MG TB24 Take 1 tablet by mouth daily.   09-08-2020   LANTUS SOLOSTAR 100 UNIT/ML Solostar Pen Inject 30 Units into the skin at bedtime.   08/26/2020   lisinopril (ZESTRIL) 40 MG tablet Take 40 mg by mouth daily.   09-08-20   Multiple Vitamin (MULTIVITAMIN WITH MINERALS) TABS tablet Take 1 tablet by mouth daily.   September 08, 2020   rosuvastatin (CRESTOR) 10 MG tablet Take 10 mg by mouth daily.   2020/09/08    Assessment: 70 year old female admitted after seizure. Patient with echo this morning showing greatly reduced EF and patient appears volume overloaded and cold. Concern for low output,  currently in afib will transition to IV heparin (lovenox for px given this morning). Noted muti-organ failure with shocked liver and aki. Hgb stable 14s, plt low but stable 130s.   Transfer to ICU.   Goal of Therapy:  Heparin level 0.3-0.7 units/ml Monitor platelets by anticoagulation protocol: Yes   Plan:  Start heparin infusion at 850 units/hr -will not bolus as she is expected to get a central line this morning and enoxaparin was already given today Check anti-Xa level in 8 hours and daily while on heparin Continue to monitor H&H and platelets  Sheppard Coil PharmD., BCPS Clinical Pharmacist 09/01/2020 10:15 AM

## 2020-09-01 NOTE — Progress Notes (Deleted)
   09/01/20 0702  Assess: MEWS Score  Temp 97.7 F (36.5 C)  BP 109/69  Pulse Rate (!) 30  ECG Heart Rate (!) 113  Resp (!) 30  SpO2 98 %  O2 Device Room Air  Assess: MEWS Score  MEWS Temp 0  MEWS Systolic 0  MEWS Pulse 2  MEWS RR 2  MEWS LOC 0  MEWS Score 4  MEWS Score Color Red  Assess: if the MEWS score is Yellow or Red  Were vital signs taken at a resting state? Yes  Focused Assessment No change from prior assessment  Early Detection of Sepsis Score *See Row Information* High  MEWS guidelines implemented *See Row Information* Yes  Treat  MEWS Interventions Escalated (See documentation below)  Escalate  MEWS: Escalate Red: discuss with charge nurse/RN and provider, consider discussing with RRT  Notify: Charge Nurse/RN  Name of Charge Nurse/RN Notified Ireti  Date Charge Nurse/RN Notified 09/01/20  Time Charge Nurse/RN Notified 0240  Notify: Provider  Provider Name/Title Dr Glade Lloyd  Date Provider Notified 09/01/20  Time Provider Notified [phone]  Notification Type Page  Notification Reason Other (Comment) (pt not improving)  Provider response See new orders  Date of Provider Response 09/01/20  Time of Provider Response [id number]  Document  Patient Outcome Other (Comment);Not stable and remains on department (reported off to oncoming RN)  Progress note created (see row info) Yes

## 2020-09-01 NOTE — Progress Notes (Signed)
Patient ID: Erica Duncan, female   DOB: [birth year], 70 y.o.   MRN: [id number]  PROGRESS NOTE    Erica Duncan  DZH:[id number] DOB: Nov 03, 1950 DOA: 09/13/2020 PCP: [facility]   Brief Narrative:  70 year old female with history of right frontotemporal stroke in 2021, hypertension, hyperlipidemia, diabetes mellitus type 2 presented with acute onset of left-sided weakness and confusion.  Patient was extremely lethargic on presentation.  On presentation, CT of the head revealed chronic MCA distribution infarct.  Neurology was consulted.  She was exhibiting rhythmic movements of the left leg and left arm concerning for focal seizure activity and she was started on IV Ativan and Keppra.  Assessment & Plan:  Paroxysmal A. fib with RVR -Patient went into A. fib with RVR on 08/31/2020 with heart rates going up to 170s.  Currently on amiodarone drip with intermittent tachycardia.  Cardiology following. -Echo pending  Hypotension -Patient had episodes of hypotension on 08/31/2020 after a amiodarone drip was started.  Subsequently, she was given IV fluids and amiodarone drip rate was decreased.  Blood pressure currently on the lower side but stable.  PCCM also evaluated the patient on 08/31/2020.  Acute kidney injury on chronic kidney disease stage IIIb Acute metabolic acidosis -Creatinine has worsened to 2.27 today.  Bicarb is 10.  We will repeat labs again stat to verify.  Abdominal ultrasound to rule out hydronephrosis and also biliary pathology because of worsening LFTs. -IV fluids discontinued on 08/31/2020 because of concerns of volume overload -We will get stat ABG and chest x-ray as well.  Elevated LFTs -Questionable cause.  May be shock liver.  We will check ultrasound of abdomen.  Repeat a.m. labs.  Seizures, most likely due to underlying stroke History of right frontotemporal stroke in 2021 -Presented with acute onset of left-sided weakness and  confusion and had focal movements of left upper and lower extremity concerning for seizure in the ED -CT of the head was negative for acute intracranial abnormality but showed chronic MCA distal infarct.  MRI of brain was negative for acute infarct although it was a motion degraded study.  CT cerebral perfusion with contrast was negative for emergent large vessel occlusion -EEG was negative for seizures.  Patient underwent LTM EEG: did not show any seizures.  Keppra dose has been further decreased by neurology because of drowsiness.  Follow neurology recommendations. -Seizure precautions.  Fall/aspiration precautions -Continue aspirin.  Discontinue statin because of elevated LFTs.  Essential hypertension -blood pressure on the lower side.  Antihypertensives on hold.    Diabetes mellitus type 2 with hyperglycemia and hypoglycemia -A1c 7.9.  Patient has had hyperglycemia and hypoglycemia.  Continue long-acting insulin.  Continue CBGs with SSI.   Mild hyponatremia -Improved.  Monitor  Thrombocytopenia -Questionable cause.  Monitor  Hypomagnesemia -Replaced and improved.    DVT prophylaxis: Lovenox Code Status: Full Family Communication: Daughter at bedside on 08/31/2020  disposition Plan: Status is: Inpatient  Remains inpatient appropriate because:Inpatient level of care appropriate due to severity of illness  Dispo: The patient is from: Home              Anticipated d/c is to: Home              Patient currently is not medically stable to d/c.   Difficult to place patient No  Consultants: Neurology  Procedures: EEG/LTM EEG  Antimicrobials: None   Subjective: Patient seen and examined at bedside.  Nursing staff reports very poor urine output.  No  overnight seizures, fever reported. Objective: Vitals:   09/01/20 0332 09/01/20 0430 09/01/20 0546 09/01/20 0702  BP: 94/81 92/71 100/68 109/69  Pulse: (!) 125 (!) 123 (!) 117 (!) 30  Resp: (!) 21 (!) 21 20 (!) 30  Temp:  97.7  F (36.5 C) 97.7 F (36.5 C) 97.7 F (36.5 C)  TempSrc:  Oral Oral Oral  SpO2:  96% 99% 98%  Weight:        Intake/Output Summary (Last 24 hours) at 09/01/2020 0733 Last data filed at 09/01/2020 0528 Gross per 24 hour  Intake 2362.81 ml  Output --  Net 2362.81 ml    Filed Weights   08/28/20 0010  Weight: 60.8 kg    Examination:  General exam: No distress.  On room air currently.  Looks chronically ill and deconditioned.   Respiratory system: Decreased breath sounds at bases with crackles and tachypnea  cardiovascular system: Tachycardic; S1-S2 heard gastrointestinal system: Abdomen is slightly distended, soft and nontender.  Bowel sounds are heard extremities: Mild lower extremity edema; no cyanosis or clubbing  Central nervous system: Wakes up slightly, still very slow to respond.  No focal neurological deficits.  Moves extremities Skin: No obvious ecchymosis/lesions  psychiatry: Cannot be assessed because of mental status  Data Reviewed: I have personally reviewed following labs and imaging studies  CBC: Recent Labs  Lab 08/28/20 0001 08/28/20 0012 08/28/20 0801 08/29/20 0415 08/30/20 0433 09/01/20 0337  WBC 6.4  --  6.5 8.9 8.2 9.6  NEUTROABS 4.6  --   --  7.4 6.3 8.0*  HGB 12.5 15.0 12.3 11.6* 12.0 14.2  HCT 41.6 44.0 38.8 37.4 37.6 46.2*  MCV 73.4*  --  71.9* 72.2* 70.9* 73.1*  PLT 165  --  147* 115* 132* 134*    Basic Metabolic Panel: Recent Labs  Lab 08/28/20 0801 08/29/20 0415 08/30/20 0433 08/31/20 0923 09/01/20 0337  NA 134* 138 137 139 136  K 4.9 4.0 3.9 3.6 4.7  CL 98 105 103 108 107  CO2 22 21* 22 19* 10*  GLUCOSE 354* 123* 183* 119* 127*  BUN 21 24* 29* 33* 47*  CREATININE 1.71* 1.66* 1.57* 1.81* 2.27*  CALCIUM 9.7 8.9 9.2 8.5* 8.8*  MG  --  1.5* 1.8 1.8 1.8    GFR: CrCl cannot be calculated (Unknown ideal weight.). Liver Function Tests: Recent Labs  Lab 08/28/20 0001 08/28/20 0801 08/29/20 0415 09/01/20 0337  AST 32 34 31  889*  ALT 19 20 20  410*  ALKPHOS 66 62 49 229*  BILITOT 1.3* 1.4* 1.3* 2.2*  PROT 7.2 6.7 5.8* 5.9*  ALBUMIN 3.5 3.2* 2.8* 2.5*    No results for input(s): LIPASE, AMYLASE in the last 168 hours. Recent Labs  Lab 08/29/20 1220  AMMONIA 28    Coagulation Profile: Recent Labs  Lab 08/28/20 0001  INR 1.3*    Cardiac Enzymes: No results for input(s): CKTOTAL, CKMB, CKMBINDEX, TROPONINI in the last 168 hours. BNP (last 3 results) No results for input(s): PROBNP in the last 8760 hours. HbA1C: No results for input(s): HGBA1C in the last 72 hours.  CBG: Recent Labs  Lab 08/31/20 1123 08/31/20 1547 08/31/20 2150 09/01/20 0250 09/01/20 0641  GLUCAP 94 115* 154* 135* 121*    Lipid Profile: No results for input(s): CHOL, HDL, LDLCALC, TRIG, CHOLHDL, LDLDIRECT in the last 72 hours.  Thyroid Function Tests: Recent Labs    08/29/20 1220  TSH 0.360    Anemia Panel: Recent Labs    08/30/20 0433  VITAMINB12 721    Sepsis Labs: No results for input(s): PROCALCITON, LATICACIDVEN in the last 168 hours.  Recent Results (from the past 240 hour(s))  SARS CORONAVIRUS 2 (TAT 6-24 HRS) Nasopharyngeal Nasopharyngeal Swab     Status: None   Collection Time: 08/28/20 10:19 AM   Specimen: Nasopharyngeal Swab  Result Value Ref Range Status   SARS Coronavirus 2 NEGATIVE NEGATIVE Final    Comment: (NOTE) SARS-CoV-2 target nucleic acids are NOT DETECTED.  The SARS-CoV-2 RNA is generally detectable in upper and lower respiratory specimens during the acute phase of infection. Negative results do not preclude SARS-CoV-2 infection, do not rule out co-infections with other pathogens, and should not be used as the sole basis for treatment or other patient management decisions. Negative results must be combined with clinical observations, patient history, and epidemiological information. The expected result is Negative.  Fact Sheet for  Patients: HairSlick.no  Fact Sheet for Healthcare Providers: quierodirigir.com  This test is not yet approved or cleared by the [place] FDA and  has been authorized for detection and/or diagnosis of SARS-CoV-2 by FDA under an Emergency Use Authorization (EUA). This EUA will remain  in effect (meaning this test can be used) for the duration of the COVID-19 declaration under Se ction 564(b)(1) of the Act, 21 U.S.C. section 360bbb-3(b)(1), unless the authorization is terminated or revoked sooner.  Performed at [facility], [address], [place], [place] [address]           Radiology Studies: DG CHEST PORT 1 VIEW  Result Date: 08/31/2020 CLINICAL DATA:  Shortness of breath. EXAM: PORTABLE CHEST 1 VIEW COMPARISON:  None. FINDINGS: Cardiomegaly. Layering pleural effusions, right greater than left with underlying opacity, likely atelectasis. No other abnormalities. IMPRESSION: Cardiomegaly and layering effusions with probable underlying atelectasis. Electronically Signed   By: Gerome Sam III M.D.   On: 08/31/2020 12:06        Scheduled Meds:  aspirin EC  81 mg Oral Daily   enoxaparin (LOVENOX) injection  40 mg Subcutaneous Q24H   insulin aspart  0-15 Units Subcutaneous TID WC   insulin aspart  0-5 Units Subcutaneous QHS   insulin glargine-yfgn  15 Units Subcutaneous QHS   rosuvastatin  10 mg Oral Daily   sodium chloride flush  3 mL Intravenous Once   Continuous Infusions:  amiodarone 60 mg/hr (09/01/20 0715)   levETIRAcetam Stopped (09/01/20 0349)   promethazine (PHENERGAN) injection (IM or IVPB)     sodium chloride            Glade Lloyd, MD [facility] 09/01/2020, 7:33 AM

## 2020-09-01 NOTE — Progress Notes (Addendum)
Repeat co-ox 59% on 0.375 milrinone and 2 NE. CVP 13. BP stable.   Starting IV lasix 80 mg BID  Back in AF this afternoon with rates 100s-110s. On amio at 60/hr. Will rebolus with 150 mg amio IV.

## 2020-09-01 NOTE — Progress Notes (Addendum)
Neurology Progress Note  S: Interpretor used for most of session (interpretor hung up). She does understand some [place]. Patient says she feels somewhat better, but still has nausea. No HA. Per RN, she was sleepy, but after a rectal temperature, she woke up.   Spoke to cardiologist who states patient is in cardiogenic shock and will be moving to ICU.   O: Current vital signs: BP (!) 92/56 (BP Location: Right Arm)   Pulse (!) 57   Temp 97.7 F (36.5 C) (Oral)   Resp (!) 23   Wt 60.8 kg   SpO2 98%  Vital signs in last 24 hours: Temp:  [97.7 F (36.5 C)-98.4 F (36.9 C)] 97.7 F (36.5 C) (08/15 0702) Pulse Rate:  [27-125] 57 (08/15 0852) Resp:  [17-33] 23 (08/15 0852) BP: (74-121)/(54-83) 92/56 (08/15 0852) SpO2:  [92 %-100 %] 98 % (08/15 0852)  GENERAL:  Poor appearing female in NAD.  HEENT: Normocephalic and atraumatic. LUNGS: Normal respiratory effort.  CV: RRR on tele.  Ext: warm.  NEURO:  Mental Status: Alert and oriented to self, age, place, city, and state. Follows commands.   Speech/Language: speech is without aphasia or dysarthria.  Naming, repetition, fluency, and comprehension intact.  Cranial Nerves:  II: PERRL.  III, IV, VI: EOMI. Eyelids elevate symmetrically.  V: Sensation is intact to light touch and symmetrical to face.  VII: Smile is symmetrical.   VIII: hearing intact to voice. IX, X: Palate elevates symmetrically. Phonation is normal.  CX:KGYJEHUD shrug 5/5. XII: tongue is midline without fasciculations. Motor:  BUE strength is 4+/5 to grip, biceps, and triceps.  BLE with 5/5 to thigh, dorsiflexion, and plantar flexion.  Tone: is normal and bulk is normal. Sensation- Intact to light touch bilaterally. Extinction absent to light touch to DSS.    Coordination: No tremors, twitching or shaking noted.   Medications  Current Facility-Administered Medications:    acetaminophen (TYLENOL) tablet 650 mg, 650 mg, Oral, Q4H PRN, 650 mg at 08/29/20 0103  **OR** acetaminophen (TYLENOL) 160 MG/5ML solution 650 mg, 650 mg, Per Tube, Q4H PRN **OR** acetaminophen (TYLENOL) suppository 650 mg, 650 mg, Rectal, Q4H PRN, Shalhoub, Deno Lunger, MD   [COMPLETED] amiodarone (NEXTERONE) 1.8 mg/mL load via infusion 150 mg, 150 mg, Intravenous, Once, 150 mg at 08/31/20 0800 **FOLLOWED BY** [EXPIRED] amiodarone (NEXTERONE PREMIX) 360-4.14 MG/200ML-% (1.8 mg/mL) IV infusion, 30 mg/hr, Intravenous, Continuous, Stopped at 08/31/20 2103 **FOLLOWED BY** amiodarone (NEXTERONE PREMIX) 360-4.14 MG/200ML-% (1.8 mg/mL) IV infusion, 60 mg/hr, Intravenous, Continuous, Hall, Carole N, DO, Last Rate: 33.3 mL/hr at 09/01/20 0715, 60 mg/hr at 09/01/20 0715   aspirin EC tablet 81 mg, 81 mg, Oral, Daily, Shalhoub, Deno Lunger, MD, 81 mg at 08/30/20 0919   enoxaparin (LOVENOX) injection 40 mg, 40 mg, Subcutaneous, Q24H, Shalhoub, Deno Lunger, MD, 40 mg at 09/01/20 0934   insulin aspart (novoLOG) injection 0-15 Units, 0-15 Units, Subcutaneous, TID WC, Alekh, Kshitiz, MD, 5 Units at 08/30/20 1827   insulin aspart (novoLOG) injection 0-5 Units, 0-5 Units, Subcutaneous, QHS, Alekh, Kshitiz, MD, 2 Units at 08/29/20 2202   insulin glargine-yfgn (SEMGLEE) injection 15 Units, 15 Units, Subcutaneous, QHS, Alekh, Kshitiz, MD, 15 Units at 08/31/20 2142   levETIRAcetam (KEPPRA) 250 mg in sodium chloride 0.9 % 100 mL IVPB, 250 mg, Intravenous, Q12H, Toberman, Stevi W, NP, Stopped at 09/01/20 0349   LORazepam (ATIVAN) injection 2 mg, 2 mg, Intravenous, Once PRN, Shalhoub, Deno Lunger, MD   metoCLOPramide (REGLAN) injection 10 mg, 10 mg, Intravenous, Q6H PRN, Hanley Ben,  Kshitiz, MD   ondansetron (ZOFRAN) injection 4 mg, 4 mg, Intravenous, Q6H PRN, Shalhoub, Deno Lunger, MD, 4 mg at 08/31/20 2256   oxyCODONE (Oxy IR/ROXICODONE) immediate release tablet 5 mg, 5 mg, Oral, Q6H PRN, Hall, Carole N, DO   polyethylene glycol (MIRALAX / GLYCOLAX) packet 17 g, 17 g, Oral, Daily PRN, Shalhoub, Deno Lunger, MD, 17 g at 08/31/20 1702    sodium chloride 0.9 % bolus 250 mL, 250 mL, Intravenous, Once PRN, Alekh, Kshitiz, MD   sodium chloride flush (NS) 0.9 % injection 3 mL, 3 mL, Intravenous, Once, Shalhoub, Deno Lunger, MD  No new Imaging  Assessment: 70 yo female who presented with first time seizure likely secondary to underlying stroke. Today's exam finds her more awake that prior exam notes. Keppra dose has been decreased due to somnolence, but she is awake today. She is in cardiogenic shock and has been seen by cardiology who are planning to move patient to higher level of care. The shock could definitely contribute to her lethargy and encephalopathy, due to reduced cardiac output.   Impression: -New onset seizure likely secondary to underlying stroke.  -New onset AF with RVR and cardiogenic shock.  -Altered mental status with improved LOC today.   Recommendations/Plan:  -Continue Keppra at 250mg  po q12 hours.  -Continue seizure precautions.  -Management of toxic metabolic co morbidities per medicine team.  -AF with new cardiogenic shock per cardiology.   Pt seen by , MSN, APN-BC/Nurse Practitioner/Neuro and later by MD. Note and plan to be edited as needed by MD.  Pager: Jimmye Norman  I have seen and evaluated the patient. I have reviewed the above note and made appropriate changes. She is slightly sleepier on my exam, but still awakens to minor stimulation. No seizures, continue keppra.   [id number], MD [facility] [phone]  If 7pm- 7am, please page neurology on call as listed in AMION.

## 2020-09-01 NOTE — Progress Notes (Signed)
PT Cancellation Note  Patient Details Name: Erica Duncan MRN: [id number] DOB: October 04, 1950   Cancelled Treatment:    Reason Eval/Treat Not Completed: Medical issues which prohibited therapy. Hold per RN. Pt in a fib with RVR. Undergoing repeat CXR now. PT to follow up tomorrow.   Ilda Foil 09/01/2020, 7:54 AM  Aida Raider, PT  Office # [phone] Pager [phone]

## 2020-09-01 NOTE — Progress Notes (Signed)
ANTICOAGULATION CONSULT NOTE - Initial Consult  Pharmacy Consult for heparin Indication: atrial fibrillation  No Known Allergies  Patient Measurements: Weight: 60.8 kg (134 lb 0.6 oz) Heparin Dosing Weight: 60kg  Vital Signs: Temp: 97.7 F (36.5 C) (08/15 2000) Temp Source: Oral (08/15 2000) BP: 105/69 (08/15 2030) Pulse Rate: 109 (08/15 2015)  Labs: Recent Labs    08/30/20 0433 08/31/20 0923 09/01/20 0337 09/01/20 0843 09/01/20 1214 09/01/20 1831 09/01/20 2023  HGB 12.0  --  14.2 14.6  --  13.6  --   HCT 37.6  --  46.2* 43.0  --  40.0  --   PLT 132*  --  134*  --   --   --   --   HEPARINUNFRC  --   --   --   --   --   --  0.51  CREATININE 1.57* 1.81* 2.27*  --  2.81*  --   --      CrCl cannot be calculated (Unknown ideal weight.).   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 Swallow whole.   September 23, 2020   glimepiride (AMARYL) 4 MG tablet Take 4 mg by mouth 2 (two) times daily.   09/23/2020   hydrochlorothiazide (HYDRODIURIL) 25 MG tablet Take 25 mg by mouth daily.   Sep 23, 2020   JANUMET XR 50-500 MG TB24 Take 1 tablet by mouth daily.   September 23, 2020   LANTUS SOLOSTAR 100 UNIT/ML Solostar Pen Inject 30 Units into the skin at bedtime.   08/26/2020   lisinopril (ZESTRIL) 40 MG tablet Take 40 mg by mouth daily.   2020-09-23   Multiple Vitamin (MULTIVITAMIN WITH MINERALS) TABS tablet Take 1 tablet by mouth daily.   23-Sep-2020   rosuvastatin (CRESTOR) 10 MG tablet Take 10 mg by mouth daily.   09/23/20    Assessment: 70 year old female  admitted after seizure. Patient with echo this morning showing greatly reduced EF and patient appears volume overloaded and cold. Concern for low output, currently in afib will transition to IV heparin (lovenox for px given this morning). Noted muti-organ failure with shocked liver and aki. Hgb stable 14s, plt low but stable 130s.   Transfer to ICU.   Heparin level 0.51 on 850 units/hr  Goal of Therapy:  Heparin level 0.3-0.7 units/ml Monitor platelets by anticoagulation protocol: Yes   Plan:  Continue heparin infusion at 850 units/hr Check confirmatory anti-Xa level with am labs and daily while on heparin Continue to monitor H&H and platelets  Jeanella Cara, PharmD, [facility] Clinical Pharmacist Please see AMION for all Pharmacists' Contact Phone Numbers 09/01/2020, 9:17 PM

## 2020-09-01 NOTE — Consult Note (Addendum)
Advanced Heart Failure Team Consult Note   Primary Physician: [facility] PCP-Cardiologist:  None  Reason for Consultation: Cardiogenic shock  HPI:    Erica Duncan is seen today for evaluation of suspected cardiogenic shock at the request of Dr. Cristal Deer with Cardiology.  This is a 70 year old female with history of CVA in 2021 in [place] per chart review, HTN, HLD, insulin dependent DM2. Patient is spanish-speaking history obtained with assistance from [employer] video interpreter.   Patient presented to the ED on 08/11 with confusion, left-sided weakness, nausea and vomiting after returning from recent trip to [place] She was hypotensive and lethargic. She was evaluated by Neurology and demonstrated focal seizure activity felt to be secondary to underlying stroke. No new stroke on MRI. Went into afib with RVR on 08/14. She was initiated on IV amiodarone with load and converted to sinus rhythm. Remained hypotensive after total ~ 2L IV fluids. PCCM and Cardiology consulted.  Appeared volume overloaded. IV fluids discontinued. Cold on exam this morning. C02 10, lactic acid pending. Preliminary review of echo with severely reduced EF, MR, TR, pericardial effusion and pleural effusion. There was rising concern for cardiogenic shock with multi-system organ failure (increasing creatinine and LFTs).  She was emergently transferred to the Cardiac ICU for management of cardiogenic shock and initiation of inotropes.  Patient reports quite a bit of nausea since admit. Vomiting PTA and this am. No chest pain or dyspnea reported.  Reports she was independent in ADLs prior to admission.   History limited - patient lethargic at time of evaluation.  FH:  - Both parents had heart attacks in their 68s per chart review.   SH: -Daughter very involved in her care. Not at bedside at time of evaluation.   Review of Systems: [y] = yes, [ ]  = no   General: Weight  gain [ ] ; Weight loss [ ] ; Anorexia [ ] ; Fatigue [ ] ; Fever [ ] ; Chills [ ] ; Weakness [Y]  Cardiac: Chest pain/pressure [ ] ; Resting SOB [ ] ; Exertional SOB [ ] ; Orthopnea [ ] ; Pedal Edema [ ] ; Palpitations [ ] ; Syncope [ ] ; Presyncope [ ] ; Paroxysmal nocturnal dyspnea[ ]   Pulmonary: Cough [ ] ; Wheezing[ ] ; Hemoptysis[ ] ; Sputum [ ] ; Snoring [ ]   GI: Vomiting[Y]; Dysphagia[ ] ; Melena[ ] ; Hematochezia [ ] ; Heartburn[ ] ; Abdominal pain [ ] ; Constipation [ ] ; Diarrhea [ ] ; BRBPR [ ]   GU: Hematuria[ ] ; Dysuria [ ] ; Nocturia[ ]   Vascular: Pain in legs with walking [ ] ; Pain in feet with lying flat [ ] ; Non-healing sores [ ] ; Stroke [ ] ; TIA [ ] ; Slurred speech [ ] ;  Neuro: Headaches[ ] ; Vertigo[ ] ; Seizures[Y]; Paresthesias[ ] ;Blurred vision [ ] ; Diplopia [ ] ; Vision changes [ ]   Ortho/Skin: Arthritis [ ] ; Joint pain [ ] ; Muscle pain [ ] ; Joint swelling [ ] ; Back Pain [ ] ; Rash [ ]   Psych: Depression[ ] ; Anxiety[ ]   Heme: Bleeding problems [ ] ; Clotting disorders [ ] ; Anemia [ ]   Endocrine: Diabetes [ ] ; Thyroid dysfunction[ ]   Home Medications Prior to Admission medications   Medication Sig Start Date End Date Taking? Authorizing Provider  aspirin EC 81 MG tablet Take 81 mg by mouth daily. Swallow whole.   Yes [provider]  glimepiride (AMARYL) 4 MG tablet Take 4 mg by mouth 2 (two) times daily. 07/31/20  Yes [provider]  hydrochlorothiazide (HYDRODIURIL) 25 MG tablet Take 25 mg by mouth daily.  07/31/20  Yes [provider]  JANUMET XR 50-500 MG TB24 Take 1 tablet by mouth daily. 07/31/20  Yes [provider]  LANTUS SOLOSTAR 100 UNIT/ML Solostar Pen Inject 30 Units into the skin at bedtime. 07/31/20  Yes [provider]  lisinopril (ZESTRIL) 40 MG tablet Take 40 mg by mouth daily. 07/31/20  Yes [provider]  Multiple Vitamin (MULTIVITAMIN WITH MINERALS) TABS tablet Take 1 tablet by mouth daily.   Yes [provider]  rosuvastatin  (CRESTOR) 10 MG tablet Take 10 mg by mouth daily. 07/31/20  Yes [provider]    Past 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 (HCC) 08/28/2020   Uncontrolled type 2 diabetes mellitus with hyperglycemia, with long-term current use of insulin (HCC) 08/28/2020    Past Surgical History: History reviewed. No pertinent surgical history.  Family History: Family History  Problem Relation Age of Onset   Heart attack Mother    Heart attack Father    Stroke Neg Hx     Social History: Social History   Socioeconomic History   Marital status: Married    Spouse name: Not on file   Number of children: Not on file   Years of education: Not on file   Highest education level: Not on file  Occupational History   Not on file  Tobacco Use   Smoking status: Never   Smokeless tobacco: Never  Substance and Sexual Activity   Alcohol use: Not on file   Drug use: Not on file   Sexual activity: Not on file  Other Topics Concern   Not on file  Social History Narrative   Not on file   Social Determinants of Health   Financial Resource Strain: Not on file  Food Insecurity: Not on file  Transportation Needs: Not on file  Physical Activity: Not on file  Stress: Not on file  Social Connections: Not on file    Allergies:  No Known Allergies  Objective:    Vital Signs:   Temp:  [97.4 F (36.3 C)-98.4 F (36.9 C)] 97.4 F (36.3 C) (08/15 0943) Pulse Rate:  [57-125] 62 (08/15 0947) Resp:  [17-33] 26 (08/15 0947) BP: (74-121)/(54-83) 104/58 (08/15 0947) SpO2:  [92 %-99 %] 97 % (08/15 0947) Last BM Date: 09/01/2020  Weight change: Filed Weights   08/28/20 0010  Weight: 60.8 kg    Intake/Output:   Intake/Output Summary (Last 24 hours) at 09/01/2020 1030 Last data filed at 09/01/2020 0943 Gross per 24  hour  Intake 1536.91 ml  Output 170 ml  Net 1366.91 ml      Physical Exam    General:  Acutely ill appearing female. Lethargic HEENT: normal Neck: supple. JVP to ear. Carotids 2+ bilat; no bruits. No lymphadenopathy or thyromegaly appreciated. Cor: PMI nondisplaced. Regular rate & rhythm. Holosystolic murmur LLSB Lungs: clear Abdomen: soft, nontender, nondistended. No hepatosplenomegaly. No bruits or masses.  Extremities: no cyanosis, clubbing, rash, trace edema Neuro: alert & orientedx3   Telemetry   Sinus rhythm 70s  EKG    Presenting ECG 08/11: Sinus tachycardia, nonspecific IVCD, poor R wave progression precordial leads  Labs   Basic Metabolic Panel: Recent Labs  Lab 08/28/20 0801 08/29/20 0415 08/30/20 0433 08/31/20 0923 09/01/20 0337 09/01/20 0843  NA 134* 138 137 139 136 134*  K 4.9 4.0 3.9 3.6 4.7  4.6  CL 98 105 103 108 107  --   CO2 22 21* 22 19* 10*  --   GLUCOSE 354* 123* 183* 119* 127*  --   BUN 21 24* 29* 33* 47*  --   CREATININE 1.71* 1.66* 1.57* 1.81* 2.27*  --   CALCIUM 9.7 8.9 9.2 8.5* 8.8*  --   MG  --  1.5* 1.8 1.8 1.8  --     Liver Function Tests: Recent Labs  Lab 08/28/20 0001 08/28/20 0801 08/29/20 0415 09/01/20 0337  AST 32 34 31 889*  ALT 19 20 20  410*  ALKPHOS 66 62 49 229*  BILITOT 1.3* 1.4* 1.3* 2.2*  PROT 7.2 6.7 5.8* 5.9*  ALBUMIN 3.5 3.2* 2.8* 2.5*   No results for input(s): LIPASE, AMYLASE in the last 168 hours. Recent Labs  Lab 08/29/20 1220  AMMONIA 28    CBC: Recent Labs  Lab 08/28/20 0001 08/28/20 0012 08/28/20 0801 08/29/20 0415 08/30/20 0433 09/01/20 0337 09/01/20 0843  WBC 6.4  --  6.5 8.9 8.2 9.6  --   NEUTROABS 4.6  --   --  7.4 6.3 8.0*  --   HGB 12.5   < > 12.3 11.6* 12.0 14.2 14.6  HCT 41.6   < > 38.8 37.4 37.6 46.2* 43.0  MCV 73.4*  --  71.9* 72.2* 70.9* 73.1*  --   PLT 165  --  147* 115* 132* 134*  --    < > = values in this interval not displayed.    Cardiac Enzymes: No results for  input(s): CKTOTAL, CKMB, CKMBINDEX, TROPONINI in the last 168 hours.  BNP: BNP (last 3 results) No results for input(s): BNP in the last 8760 hours.  ProBNP (last 3 results) No results for input(s): PROBNP in the last 8760 hours.   CBG: Recent Labs  Lab 08/31/20 1547 08/31/20 2150 09/01/20 0250 09/01/20 0641 09/01/20 0929  GLUCAP 115* 154* 135* 121* 114*    Coagulation Studies: No results for input(s): LABPROT, INR in the last 72 hours.   Imaging   DG CHEST PORT 1 VIEW  Result Date: 09/01/2020 CLINICAL DATA:  Dyspnea EXAM: PORTABLE CHEST 1 VIEW COMPARISON:  Chest radiograph 1 day prior FINDINGS: The heart is markedly enlarged, unchanged. The mediastinal contours are stable. There is calcified atherosclerotic plaque of the aortic arch. There is dense retrocardiac opacity, not significantly changed. There is an associated left pleural effusion. There is a small right pleural effusion; aeration at the right base has improved in the interim. The upper lungs are well aerated. There is no pulmonary edema. There is no pneumothorax. The bones are stable. IMPRESSION: 1. Left pleural effusion with adjacent retrocardiac opacity which may reflect atelectasis or pneumonia, not significantly changed. 2. Unchanged small right pleural effusion with improved aeration at the right base. Electronically Signed   By: Lesia Hausen M.D.   On: 09/01/2020 08:04     Medications:     Current Medications:  aspirin EC  81 mg Oral Daily   insulin aspart  0-15 Units Subcutaneous TID WC   insulin aspart  0-5 Units Subcutaneous QHS   insulin glargine-yfgn  15 Units Subcutaneous QHS   sodium chloride flush  3 mL Intravenous Once    Infusions:  amiodarone 60 mg/hr (09/01/20 0715)   heparin     levETIRAcetam Stopped (09/01/20 0349)   milrinone     sodium chloride        Assessment/Plan  Shock, suspect cardiogenic: -Admit with seizure activity  on 08/11, SR on admit. Hypotensive. BP meds held. Afib  with RVR yesterday, hypotensive despite converting to NSR and IV fluids. Cold and wet on exam today. -C02 10, lactic acid pending. No HS troponin this admit. -AF, no leukocytosis to suggest septic shock, Urine culture pending. -Evidence of multisystem organ failure with worsening renal function (Scr 2.27) and elevated LFTs. -Transferred emergently to the ICU. Obtain central access via L IJ for inotrope support, Co-ox and CVP monitoring. Preserve R IJ in case needs CRRT. -Initiate milrinone 0.25 mcg/kg/min and NE to keep MAP > 65  2. Acute systolic HF: -New diagnosis this admit -EF ~ 20%, MR, TR on echo -Possibly triggered by AF with RVR. Will need ischemic workup. Presenting ECG with evidence of poor R wave progression in precordial leads. Multiple RF for CAD. -Unable to initiate GDMT d/t shock and hypotension -Strict Is/Os -Appears volume up. JVP to ear. Will decide on diuretic dose depending on co-ox and CVP  3. Atrial fibrillation with RVR: -Noted 08/14. Converted with IV amio. Continue amio gtt at 60/hr -Heparin gtt -TSH 0.360  4. AKI on CKD, stage IIIb -Likely d/t #1 -Cr 1.74 > 2.27 -Daily labs -Maintain MAP > 65 -Minimal output last 24 hrs, closely monitor. Hopefully output picks up with inotrope support.  5. Elevated LFTs -AST 31 > 889 -ALT 20 > 410 -May be shock liver.  -Continue to monitor.  6. Insulin dependent DM II -SSI -Semglee -Alc 7.9  7. HLD -No statin until LFTs stabilize  8. Pericardial effusion -Will need to monitor  9. Hx CVA/seizure activity -Followed by Neurology -On Keppra -No acute infarct on MRI brain and CT head   ADDENDUM: STAT co-ox obtained once central access achieved. Co-ox 41% Now on NE and milrinone  Daughter at bedside. Patient had been more fatigued and dyspneic with exertion for a couple of weeks PTA. Decreased appetite with poor PO intake   Length of Stay: 4  FINCH, LINDSAY N, PA-C  09/01/2020, 10:30 AM  Advanced Heart  Failure Team Pager [phone] (M-F; 7a - 5p)  Please contact [facility] for night-coverage after hours (4p -7a ) and weekends on [email or web address]   Agree with above.   70 y/o woman as above with no previous cardiac history admitted with acute systolic HF in setting of AF.   Echo with severe biventricular dysfunction EF 20%. Severe MR/TR  Developed progressive shock this am with ab pain and nausea and AKI. Bicarb down to 10.  ABG with severe metabolic acidosis  General:  Ill appearing. Diaphoretic and nauseated HEENT: normal Neck: supple. JVP 10. Carotids 2+ bilat; no bruits. No lymphadenopathy or thryomegaly appreciated. Cor: PMI nondisplaced. Regular rate & rhythm. +s3 3/6 MR Lungs: clear Abdomen: soft, nontender, nondistended. No hepatosplenomegaly. No bruits or masses. Good bowel sounds. Extremities: no cyanosis, clubbing, rash, edema cool Neuro: alert & orientedx3, cranial nerves grossly intact. moves all 4 extremities w/o difficulty. Affect stressed  She has severe cardiogenic shock. Suspect CM due to AF.  Move to ICU. Place central access. Start inotropes.   Continue amio/heparin for AF.    Will eventually need cath. Watch renall function.   CRITICAL CARE Performed by: Arvilla Meres  Total critical care time: 45 minutes  Critical care time was exclusive of separately billable procedures and treating other patients.  Critical care was necessary to treat or prevent imminent or life-threatening deterioration.  Critical care was time spent personally by me (independent of midlevel providers or residents) on the following activities:  development of treatment plan with patient and/or surrogate as well as nursing, discussions with consultants, evaluation of patient's response to treatment, examination of patient, obtaining history from patient or surrogate, ordering and performing treatments and interventions, ordering and review of laboratory studies, ordering and review of  radiographic studies, pulse oximetry and re-evaluation of patient's condition.  Arvilla Meresaniel Jakera Beaupre, MD  12:40 PM

## 2020-09-01 NOTE — Progress Notes (Signed)
  Patient now on NE 3 and milrinone 0.375  She appears lethargic but will respond to commands and have some conversation.   She is now back in AF despite amio gtt.   Arterial line placed for closer management.   Lactic acid trending down  4.8 -> 2.7 -> 2.3. Ab pain resolving. Abdominal exam unimpressive currently  ABG improved.  Ammonia 73.   RUQ u/s done. Study reviewed personally: + ascites. GB wall thickening with positive Murphy's sign (but suspect due to HF and not acute cholecystitis)   Scr now up to 2.8 but now starting to make urine.   She remains very tenuous but shock seems to be responding to therapy with resolution of acidosis. Hopefully end-organ function will begin to improve.  We will follow closely. Updated her daughter at the bedside personally.   Additional CCT 70 mins.  Arvilla Meres, MD  10:06 PM

## 2020-09-01 NOTE — CV Procedure (Signed)
Central Venous Catheter Insertion Procedure Note Saylah Ketner [id number] 1950-08-28    Procedure: Insertion of Central Venous Catheter Indications: Shock   Procedure Details Consent: Risks of procedure as well as the alternatives and risks of each were explained to the (patient/caregiver).  Consent for procedure obtained. Time Out: Verified patient identification, verified procedure, site/side was marked, verified correct patient position, special equipment/implants available, medications/allergies/relevent history reviewed, required imaging and test results available.  Performed   Maximum sterile technique was used including antiseptics, cap, gloves, gown, hand hygiene, mask and sheet. Skin prep: Chlorhexidine; local anesthetic administered A antimicrobial bonded/coated triple lumen catheter was placed in the left internal jugular vein using the Seldinger technique and u/s guidance.   Evaluation Blood flow good Complications: No apparent complications Patient did tolerate procedure well. Chest X-ray ordered to verify placement.  CXR: placement verified. No PTX.   Arvilla Meres, MD  12:40 PM

## 2020-09-01 NOTE — Progress Notes (Addendum)
Lactic acid 4.8 > 2.7  Ammonia 28 > 73  Lethargic this afternoon, easily awakened at bedside.  Discussed with Dr. Teressa Lower. Will add lactulose 20 mg TID.   RT will place arterial line and obtain ABG.

## 2020-09-01 NOTE — Progress Notes (Signed)
Progress Note  Patient Name: Erica Duncan Date of Encounter: 09/01/2020  [facility] Cardiologist: None   Subjective   Awake but cold on exam this AM. Used phone translator service, answers questions. Denies specific complaints, overall feels poorly. I called and spoke to her daughter Erica Duncan as well, discussed plans to move to ICU. She is amenable.  Inpatient Medications    Scheduled Meds:  aspirin EC  81 mg Oral Daily   enoxaparin (LOVENOX) injection  40 mg Subcutaneous Q24H   insulin aspart  0-15 Units Subcutaneous TID WC   insulin aspart  0-5 Units Subcutaneous QHS   insulin glargine-yfgn  15 Units Subcutaneous QHS   sodium chloride flush  3 mL Intravenous Once   Continuous Infusions:  amiodarone 60 mg/hr (09/01/20 0715)   levETIRAcetam Stopped (09/01/20 0349)   sodium chloride     PRN Meds: acetaminophen **OR** acetaminophen (TYLENOL) oral liquid 160 mg/5 mL **OR** acetaminophen, LORazepam, metoCLOPramide (REGLAN) injection, ondansetron (ZOFRAN) IV, oxyCODONE, polyethylene glycol, sodium chloride   Vital Signs    Vitals:   09/01/20 0546 09/01/20 0702 09/01/20 0751 09/01/20 0852  BP: 100/68 109/69 104/71 (!) 92/56  Pulse: (!) 117 (!) 115  (!) 57  Resp: 20 (!) 30 (!) 33 (!) 23  Temp: 97.7 F (36.5 C) 97.7 F (36.5 C)    TempSrc: Oral Oral    SpO2: 99% 98% 99% 98%  Weight:        Intake/Output Summary (Last 24 hours) at 09/01/2020 0918 Last data filed at 09/01/2020 0745 Gross per 24 hour  Intake 2336.59 ml  Output 170 ml  Net 2166.59 ml   Last 3 Weights 08/28/2020  Weight (lbs) 134 lb 0.6 oz  Weight (kg) 60.8 kg      Telemetry    Sinus with paroxysmal atrial fibrillation - Personally Reviewed  ECG    No new since 08/31/20 - Personally Reviewed  Physical Exam   GEN: No acute distress.  Awake, answers questions with translator assistance Neck: JVD elevated to ear Cardiac: RRR, 2/6 HSM Respiratory: scattered rhonchi, rales at  bilateral bases GI: Soft, nontender, non-distended  MS: Cold lower extremities today, trace bilateral LE edema Neuro:  Nonfocal  Psych: Normal affect   Labs    High Sensitivity Troponin:  No results for input(s): TROPONINIHS in the last 720 hours.    Chemistry Recent Labs  Lab 08/28/20 0801 08/29/20 0415 08/30/20 0433 08/31/20 0923 09/01/20 0337 09/01/20 0843  NA 134* 138 137 139 136 134*  K 4.9 4.0 3.9 3.6 4.7 4.6  CL 98 105 103 108 107  --   CO2 22 21* 22 19* 10*  --   GLUCOSE 354* 123* 183* 119* 127*  --   BUN 21 24* 29* 33* 47*  --   CREATININE 1.71* 1.66* 1.57* 1.81* 2.27*  --   CALCIUM 9.7 8.9 9.2 8.5* 8.8*  --   PROT 6.7 5.8*  --   --  5.9*  --   ALBUMIN 3.2* 2.8*  --   --  2.5*  --   AST 34 31  --   --  889*  --   ALT 20 20  --   --  410*  --   ALKPHOS 62 49  --   --  229*  --   BILITOT 1.4* 1.3*  --   --  2.2*  --   GFRNONAA 32* 33* 35* 30* 23*  --   ANIONGAP 14 12 12 12  19*  --  Hematology Recent Labs  Lab 08/29/20 0415 08/30/20 0433 09/01/20 0337 09/01/20 0843  WBC 8.9 8.2 9.6  --   RBC 5.18* 5.30* 6.32*  --   HGB 11.6* 12.0 14.2 14.6  HCT 37.4 37.6 46.2* 43.0  MCV 72.2* 70.9* 73.1*  --   MCH 22.4* 22.6* 22.5*  --   MCHC 31.0 31.9 30.7  --   RDW 16.9* 16.5* 18.6*  --   PLT 115* 132* 134*  --     BNPNo results for input(s): BNP, PROBNP in the last 168 hours.   DDimer No results for input(s): DDIMER in the last 168 hours.   Radiology    DG CHEST PORT 1 VIEW  Result Date: 09/01/2020 CLINICAL DATA:  Dyspnea EXAM: PORTABLE CHEST 1 VIEW COMPARISON:  Chest radiograph 1 day prior FINDINGS: The heart is markedly enlarged, unchanged. The mediastinal contours are stable. There is calcified atherosclerotic plaque of the aortic arch. There is dense retrocardiac opacity, not significantly changed. There is an associated left pleural effusion. There is a small right pleural effusion; aeration at the right base has improved in the interim. The upper lungs  are well aerated. There is no pulmonary edema. There is no pneumothorax. The bones are stable. IMPRESSION: 1. Left pleural effusion with adjacent retrocardiac opacity which may reflect atelectasis or pneumonia, not significantly changed. 2. Unchanged small right pleural effusion with improved aeration at the right base. Electronically Signed   By: Lesia Hausen M.D.   On: 09/01/2020 08:04   DG CHEST PORT 1 VIEW  Result Date: 08/31/2020 CLINICAL DATA:  Shortness of breath. EXAM: PORTABLE CHEST 1 VIEW COMPARISON:  None. FINDINGS: Cardiomegaly. Layering pleural effusions, right greater than left with underlying opacity, likely atelectasis. No other abnormalities. IMPRESSION: Cardiomegaly and layering effusions with probable underlying atelectasis. Electronically Signed   By: Gerome Sam III M.D.   On: 08/31/2020 12:06    Cardiac Studies   Echo pending today, see my prelim read below  Patient Profile     70 y.o. female with PMH prior CVA, hypertension, hyperlipidemia, type II diabetes on insulin, chronic kidney disease stage 3b who presented with new seizures. Cardiology consulted by Dr. Hanley Ben for new paroxysmal atrial fibrillation.  Assessment & Plan    New atrial fibrillation with RVR, paroxysmal New cardiomyopathy, unknown if ischemic or nonischemic Concern for cardiogenic shock as etiology of her hypotension, with elevated LFTs concerning for acute hepatic injury -I reviewed her prelim echo images. Severely reduced LVEF, with significant MR/TR and severely elevated PA pressures. Pleural and pericardial effusion noted -this fits with her clinical picture of volume overload. She needs diuresis, but her blood pressures are low. She needs central access, Coox, CVP monitoring, and pressors and inotropes to support diuresis. I have reached out to the primary team to discuss transfer -I will ask our advanced heart failure team to see her and assist with management. -I would recommend starting  heparin once central access obtained given her paroxysmal atrial fibrillation and chadsvasc score of 6. -suspect she will need ischemic eval, TBD if cath this admission based on renal function, would continue heparin and not DOAC until this is determined -suspect that elevated LFTs due to hypoperfusion/hypotension, though cannot exclude amiodarone. Given that she decompensates with afib, would continue amiodarone for now and monitor LFTs closely  ABG: concerning for metabolic acidosis with respiratory alkalosis -I ordered lactate   CV risk factors: -hyperlipidemia -type II diabetes on insulin -chronic kidney disease stage 3b -prior CVA  Seizures: -per  neurology  Discussed with primary team. I am concerned given her picture that she is decompensating further. Will transfer to ICU for inotropes and pressors. I have also consulted advanced heart failure to assist in management.  I was at bedside while advanced heart failure, primary team and PCCM came to evaluate her.  CRITICAL CARE Patient is critically ill with multiple organ systems affected and requires high complexity decision making. Total critical care time: 50 minutes. This time includes gathering of history, evaluation of patient's response to treatment, examination of patient, review of laboratory and imaging studies, and coordination with consultants. Greater than 50% of time spent in direct patient care.   For questions or updates, please contact [facility] Please consult [email or web address] for contact info under     Signed, Jodelle Red, MD  09/01/2020, 9:18 AM

## 2020-09-01 NOTE — Progress Notes (Signed)
   09/01/20 0702  Assess: MEWS Score  Temp 97.7 F (36.5 C)  BP 109/69  Pulse Rate (!) 115  ECG Heart Rate (!) 113  Resp (!) 30  SpO2 98 %  O2 Device Room Air  Assess: MEWS Score  MEWS Temp 0  MEWS Systolic 0  MEWS Pulse 2  MEWS RR 2  MEWS LOC 0  MEWS Score 4  MEWS Score Color Red  Assess: if the MEWS score is Yellow or Red  Were vital signs taken at a resting state? Yes  Focused Assessment No change from prior assessment  Early Detection of Sepsis Score *See Row Information* High  MEWS guidelines implemented *See Row Information* Yes  Treat  MEWS Interventions Escalated (See documentation below)  Escalate  MEWS: Escalate Red: discuss with charge nurse/RN and provider, consider discussing with RRT  Notify: Charge Nurse/RN  Name of Charge Nurse/RN Notified Ireti  Date Charge Nurse/RN Notified 09/01/20  Time Charge Nurse/RN Notified [phone]  Notify: Provider  Provider Name/Title Dr Glade Lloyd  Date Provider Notified 09/01/20  Time Provider Notified [phone]  Notification Type Page  Notification Reason Other (Comment) (pt not improving)  Provider response See new orders  Date of Provider Response 09/01/20  Time of Provider Response [phone]  Document  Patient Outcome Other (Comment);Not stable and remains on department (reported off to oncoming RN)  Progress note created (see row info) Yes

## 2020-09-01 NOTE — Progress Notes (Signed)
   09/01/20 0250  Assess: MEWS Score  BP 100/78  Pulse Rate (!) 116  ECG Heart Rate (!) 117  Resp 20  SpO2 98 %  O2 Device Room Air  Assess: MEWS Score  MEWS Temp 0  MEWS Systolic 1  MEWS Pulse 2  MEWS RR 0  MEWS LOC 0  MEWS Score 3  MEWS Score Color Yellow  Assess: if the MEWS score is Yellow or Red  Were vital signs taken at a resting state? Yes  Focused Assessment No change from prior assessment  Early Detection of Sepsis Score *See Row Information* Low  MEWS guidelines implemented *See Row Information* Yes  Treat  MEWS Interventions Escalated (See documentation below)  Take Vital Signs  Increase Vital Sign Frequency  Yellow: Q 2hr X 2 then Q 4hr X 2, if remains yellow, continue Q 4hrs  Escalate  MEWS: Escalate Yellow: discuss with charge nurse/RN and consider discussing with provider and RRT  Notify: Charge Nurse/RN  Name of Charge Nurse/RN Notified Lawrence Santiago  Date Charge Nurse/RN Notified 09/01/20  Time Charge Nurse/RN Notified 0250  Notify: Provider  Provider Name/Title Dr. Margo Aye  Date Provider Notified 09/01/20  Time Provider Notified [phone]  Notification Type Page  Notification Reason Other (Comment) (pt HR back in Afib)  Provider response See new orders  Date of Provider Response 09/01/20  Time of Provider Response 0300  Document  Patient Outcome Stabilized after interventions

## 2020-09-01 NOTE — Progress Notes (Signed)
*  PRELIMINARY RESULTS* Echocardiogram 2D Echocardiogram has been performed.  Neomia Dear RDCS 09/01/2020, 9:13 AM

## 2020-09-01 NOTE — Progress Notes (Signed)
Pt did not void during shift, MD notified and order received for I&O cath, pt catheterized for , urine concentrated amber, orange color with slightly cloudy with sediments. MD notified and new order received for UA. MD aware of pt red mews and new orders received. MD said he will come evaluate pt, reported off to oncoming RN. Dionne Bucy RN

## 2020-09-01 NOTE — Progress Notes (Signed)
Date and time results received: 09/01/20 8:47 (use smartphrase ".now" to insert current time)  Test: ABG Critical Value: Results below  Name of Provider Notified: Dr. Hanley Ben   Results for [facility], Erica Duncan (MRN [id number]) as of 09/01/2020 09:50  Ref. Range 09/01/2020 08:43  Sample type Unknown ARTERIAL  pH, Arterial Latest Ref Range: 7.350 - 7.450  7.302 (L)  pCO2 arterial Latest Ref Range: 32.0 - 48.0 mmHg 17.7 (LL)  pO2, Arterial Latest Ref Range: 83.0 - 108.0 mmHg 88  TCO2 Latest Ref Range: 22 - 32 mmol/L 9 (L)  Acid-base deficit Latest Ref Range: 0.0 - 2.0 mmol/L 15.0 (H)  Bicarbonate Latest Ref Range: 20.0 - 28.0 mmol/L 8.8 (L)  O2 Saturation Latest Units: % 96.0  Patient temperature Unknown 97.5 F

## 2020-09-02 ENCOUNTER — Other Ambulatory Visit: Payer: Self-pay

## 2020-09-02 DIAGNOSIS — G40909 Epilepsy, unspecified, not intractable, without status epilepticus: Secondary | ICD-10-CM | POA: Diagnosis not present

## 2020-09-02 DIAGNOSIS — I69398 Other sequelae of cerebral infarction: Secondary | ICD-10-CM | POA: Diagnosis not present

## 2020-09-02 LAB — MRSA NEXT GEN BY PCR, NASAL: MRSA by PCR Next Gen: NOT DETECTED

## 2020-09-02 LAB — COMPREHENSIVE METABOLIC PANEL
ALT: 857 U/L — ABNORMAL HIGH (ref 0–44)
AST: 1758 U/L — ABNORMAL HIGH (ref 15–41)
Albumin: 2.2 g/dL — ABNORMAL LOW (ref 3.5–5.0)
Alkaline Phosphatase: 163 U/L — ABNORMAL HIGH (ref 38–126)
Anion gap: 12 (ref 5–15)
BUN: 59 mg/dL — ABNORMAL HIGH (ref 8–23)
CO2: 19 mmol/L — ABNORMAL LOW (ref 22–32)
Calcium: 8.3 mg/dL — ABNORMAL LOW (ref 8.9–10.3)
Chloride: 102 mmol/L (ref 98–111)
Creatinine, Ser: 2.75 mg/dL — ABNORMAL HIGH (ref 0.44–1.00)
GFR, Estimated: 18 mL/min — ABNORMAL LOW (ref >60)
Glucose, Bld: 266 mg/dL — ABNORMAL HIGH (ref 70–99)
Potassium: 3.4 mmol/L — ABNORMAL LOW (ref 3.5–5.1)
Sodium: 133 mmol/L — ABNORMAL LOW (ref 135–145)
Total Bilirubin: 1.6 mg/dL — ABNORMAL HIGH (ref 0.3–1.2)
Total Protein: 5.2 g/dL — ABNORMAL LOW (ref 6.5–8.1)

## 2020-09-02 LAB — GLUCOSE, CAPILLARY
Glucose-Capillary: 283 mg/dL — ABNORMAL HIGH (ref 70–99)
Glucose-Capillary: 270 mg/dL — ABNORMAL HIGH (ref 70–99)
Glucose-Capillary: 223 mg/dL — ABNORMAL HIGH (ref 70–99)
Glucose-Capillary: 180 mg/dL — ABNORMAL HIGH (ref 70–99)

## 2020-09-02 LAB — COOXEMETRY PANEL
Carboxyhemoglobin: 0.9 % (ref 0.5–1.5)
Carboxyhemoglobin: 1.2 % (ref 0.5–1.5)
Methemoglobin: 0.8 % (ref 0.0–1.5)
Methemoglobin: 0.8 % (ref 0.0–1.5)
O2 Saturation: 52.1 %
O2 Saturation: 65.6 %
Total hemoglobin: 12.2 g/dL (ref 12.0–16.0)
Total hemoglobin: 12.1 g/dL (ref 12.0–16.0)

## 2020-09-02 LAB — URINE CULTURE: Culture: NO GROWTH

## 2020-09-02 LAB — HEPARIN LEVEL (UNFRACTIONATED): Heparin Unfractionated: 0.57 IU/mL (ref 0.30–0.70)

## 2020-09-02 LAB — CBC
HCT: 37.1 % (ref 36.0–46.0)
Hemoglobin: 12.1 g/dL (ref 12.0–15.0)
MCH: 22.3 pg — ABNORMAL LOW (ref 26.0–34.0)
MCHC: 32.6 g/dL (ref 30.0–36.0)
MCV: 68.5 fL — ABNORMAL LOW (ref 80.0–100.0)
Platelets: 159 10*3/uL (ref 150–400)
RBC: 5.42 MIL/uL — ABNORMAL HIGH (ref 3.87–5.11)
RDW: 15.9 % — ABNORMAL HIGH (ref 11.5–15.5)
WBC: 9.7 10*3/uL (ref 4.0–10.5)
nRBC: 1.4 % — ABNORMAL HIGH (ref 0.0–0.2)

## 2020-09-02 LAB — LACTIC ACID, PLASMA: Lactic Acid, Venous: 1.5 mmol/L (ref 0.5–1.9)

## 2020-09-02 MED ORDER — POTASSIUM CHLORIDE CRYS ER 10 MEQ PO TBCR
40.0000 meq | EXTENDED_RELEASE_TABLET | Freq: Two times a day (BID) | ORAL | Status: AC
  Administered 2020-09-02 (×2): 40 meq via ORAL
  Filled 2020-09-02 (×2): qty 4

## 2020-09-02 MED ORDER — INSULIN GLARGINE-YFGN 100 UNIT/ML ~~LOC~~ SOLN
18.0000 [IU] | Freq: Every day | SUBCUTANEOUS | Status: DC
  Administered 2020-09-02: 18 [IU] via SUBCUTANEOUS
  Filled 2020-09-02 (×2): qty 0.18

## 2020-09-02 NOTE — Progress Notes (Signed)
? ?  Inpatient Rehab Admissions Coordinator : ? ?Per therapy recommendations, patient was screened for CIR candidacy by Jaremy Nosal RN MSN.  At this time patient appears to be a potential candidate for CIR. I will place a rehab consult per protocol for full assessment. Please call me with any questions. ? ?Chisa Kushner RN MSN ?Admissions Coordinator ?[phone] ?  ?

## 2020-09-02 NOTE — Care Management Important Message (Signed)
Important Message  Patient Details  Name: Erica Duncan MRN: [id number] Date of Birth: [birth year]   Medicare Important Message Given:  Yes  Patient left prior to IM delivery will mail to the patient  home address   Dorena Bodo 09/02/2020, 9:58 AM

## 2020-09-02 NOTE — Progress Notes (Signed)
Subjective: More awake today  Exam: Vitals:   09/02/20 0830 09/02/20 0900  BP: 101/64   Pulse: 76 76  Resp: (!) 22 (!) 23  Temp:    SpO2: 96% 96%   Gen: In bed, NAD Resp: non-labored breathing, no acute distress Abd: soft, nt  Neuro: MS: awake, alert, not oriented to month or year.  XO:VANV, face symmetric Motor: MAEW Sensory:intact to LT  Pertinent Labs: GFR 18  Impression: 70 yo F with first time seizure with history of cortical stroke. I would favor continued indefinite anti-epileptic therapy. I suspect her mental status fluctuations have had more to do with her medical status than with keppra, but agree with having reduced her dose given renal function.   Recommendations: 1)Continue keppra 250mg  BID.  2) Neurology will be availabel on an as needed basis.   , MD [facility] [phone]  If 7pm- 7am, please page neurology on call as listed in AMION.

## 2020-09-02 NOTE — Progress Notes (Signed)
Inpatient Diabetes Program Recommendations  AACE/ADA: New Consensus Statement on Inpatient Glycemic Control (2015)  Target Ranges:  Prepandial:   less than 140 mg/dL      Peak postprandial:   less than 180 mg/dL (1-2 hours)      Critically ill patients:  140 - 180 mg/dL   Lab Results  Component Value Date   GLUCAP 283 (H) 09/02/2020   HGBA1C 7.9 (H) 08/28/2020    Review of Glycemic Control Results for HAYLEY, HORN (MRN [id number]) as of 09/02/2020 09:20  Ref. Range 09/01/2020 09:29 09/01/2020 11:52 09/01/2020 17:22 09/01/2020 21:51 09/02/2020 08:16  Glucose-Capillary Latest Ref Range: 70 - 99 mg/dL 762 (H) 263 (H)  Novolog 2 units  263 (H)  Novolog 8 units  232 (H)  Novolog 2 units Semglee 15 units  283 (H)  Novolog 8 units   Diabetes history: DM2 Outpatient Diabetes medications: Lantus 30 units,Amaryl 4 mg bid,Janumet 50-500 qd Current orders for Inpatient glycemic control: Semglee 15 units QD, Novolog 0-15 units TID and 0-5 QHS  Inpatient Diabetes Program Recommendations:    CBG's elevated since last evening.  Eating a pureed diet.  Please consider:  Semglee 15 units QD (50% of home dose Novolog 3 tid with meals   Will continue to follow while inpatient.  Thank you, Dulce Sellar, RN, BSN Diabetes Coordinator Inpatient Diabetes Program [phone] (team pager from 8a-5p)

## 2020-09-02 NOTE — Progress Notes (Addendum)
Advanced Heart Failure Rounding Note   Note copied over by mistake. Unable to find revision history to go back to previous note. This note was rebuilt with info from yesterday  Subjective:    Remains weak. Now on NE and milrinone. NE at 20 and milrinone at 0.375.  Ab pain and acidosis improving.   Starting to make urine. Liver and kidney function plateauing   Objective:   Weight Range:  Vital Signs:   Temp:  [97.4 F (36.3 C)-98.2 F (36.8 C)] 98.2 F (36.8 C) (08/16 0430) Pulse Rate:  [25-130] 76 (08/16 0600) Resp:  [14-35] 20 (08/16 0600) BP: (86-127)/(49-85) 111/58 (08/16 0600) SpO2:  [87 %-99 %] 93 % (08/16 0600) Arterial Line BP: (87-118)/(47-65) 108/52 (08/16 0600) Weight:  [65.9 kg] 65.9 kg (08/16 0500) Last BM Date: 09/16/2020  Weight change: Filed Weights   08/28/20 0010 09/02/20 0500  Weight: 60.8 kg 65.9 kg    Intake/Output:   Intake/Output Summary (Last 24 hours) at 09/02/2020 0640 Last data filed at 09/02/2020 0600 Gross per 24 hour  Intake 1483.83 ml  Output 670 ml  Net 813.83 ml     Physical Exam: General:  Lying flat in bed. Weak appearing. No resp difficulty HEENT: normal Neck: supple. nJVP to jaw Carotids 2+ bilat; no bruits. No lymphadenopathy or thryomegaly appreciated. Cor: PMI nondisplaced. Regular rate & rhythm. No rubs, gallops or murmurs. Lungs: clear Abdomen: soft, tender RUQ, nondistended. No hepatosplenomegaly. No bruits or masses. Good bowel sounds. Extremities: no cyanosis, clubbing, rash, edema Neuro: alert & orientedx3, cranial nerves grossly intact. moves all 4 extremities w/o difficulty. Affect pleasant   Telemetry: In out AF <-> NSR Personally reviewed   Labs: Basic Metabolic Panel: Recent Labs  Lab 08/29/20 0415 08/30/20 0433 08/31/20 0923 09/01/20 0337 09/01/20 0843 09/01/20 1214 09/01/20 1831 09/01/20 2309 09/02/20 0416  NA 138 137 139 136 134* 138 135 133* 133*  K 4.0 3.9 3.6 4.7 4.6 4.2 3.9 3.7 3.4*   CL 105 103 108 107  --  106  --  102 102  CO2 21* 22 19* 10*  --  15*  --  18* 19*  GLUCOSE 123* 183* 119* 127*  --  196*  --  277* 266*  BUN 24* 29* 33* 47*  --  52*  --  60* 59*  CREATININE 1.66* 1.57* 1.81* 2.27*  --  2.81*  --  2.72* 2.75*  CALCIUM 8.9 9.2 8.5* 8.8*  --  8.6*  --  8.4* 8.3*  MG 1.5* 1.8 1.8 1.8  --   --   --   --   --     Liver Function Tests: Recent Labs  Lab 08/28/20 0801 08/29/20 0415 09/01/20 0337 09/01/20 1214 09/02/20 0416  AST 34 31 889* 1,418* 1,758*  ALT 20 20 410* 651* 857*  ALKPHOS 62 49 229* 191* 163*  BILITOT 1.4* 1.3* 2.2* 2.3* 1.6*  PROT 6.7 5.8* 5.9* 5.5* 5.2*  ALBUMIN 3.2* 2.8* 2.5* 2.4* 2.2*   No results for input(s): LIPASE, AMYLASE in the last 168 hours. Recent Labs  Lab 08/29/20 1220 09/01/20 1501  AMMONIA 28 73*    CBC: Recent Labs  Lab 08/28/20 0001 08/28/20 0012 08/28/20 0801 08/29/20 0415 08/30/20 0433 09/01/20 0337 09/01/20 0843 09/01/20 1831 09/02/20 0416  WBC 6.4  --  6.5 8.9 8.2 9.6  --   --  9.7  NEUTROABS 4.6  --   --  7.4 6.3 8.0*  --   --   --  HGB 12.5   < > 12.3 11.6* 12.0 14.2 14.6 13.6 12.1  HCT 41.6   < > 38.8 37.4 37.6 46.2* 43.0 40.0 37.1  MCV 73.4*  --  71.9* 72.2* 70.9* 73.1*  --   --  68.5*  PLT 165  --  147* 115* 132* 134*  --   --  159   < > = values in this interval not displayed.    Cardiac Enzymes: No results for input(s): CKTOTAL, CKMB, CKMBINDEX, TROPONINI in the last 168 hours.  BNP: BNP (last 3 results) No results for input(s): BNP in the last 8760 hours.  ProBNP (last 3 results) No results for input(s): PROBNP in the last 8760 hours.    Other results:  Imaging: US Abdomen Complete  Result Date: 09/01/2020 CLINICAL DATA:  Acute renal injury EXAM: ABDOMEN ULTRASOUND COMPLETE COMPARISON:  None. FINDINGS: Gallbladder: Gallbladder is well distended with gallbladder wall thickening to 5.7 mm. Positive sonographic Murphy's sign is elicited. No cholelithiasis or gallbladder  sludge is noted however. Common bile duct: Diameter: 4 mm Liver: No focal lesion identified. Within normal limits in parenchymal echogenicity. Portal vein is patent on color Doppler imaging with normal direction of blood flow towards the liver. IVC: No abnormality visualized. Pancreas: Visualized portion unremarkable. Spleen: Size and appearance within normal limits. Right Kidney: Length: 8.9 cm. Echogenicity within normal limits. No mass or hydronephrosis visualized. Left Kidney: Length: 9.4 cm. Echogenicity within normal limits. No mass or hydronephrosis visualized. Abdominal aorta: No aneurysm visualized. Other findings: Right-sided pleural effusion and mild ascites are seen. The wall thickening in the gallbladder may be reactive in nature given the ascites. IMPRESSION: Gallbladder wall thickening with positive sonographic Murphy's sign. The thickening may be reactive to the known ascites. The positive sonographic Murphy's sign is a complicating factor. If there is concern for cholecystitis, HIDA scan is recommended to assess for cystic duct patency. Right-sided pleural effusion and mild ascites. No other focal abnormality is noted. Electronically Signed   By: Alcide Clever M.D.   On: 09/01/2020 21:59   DG CHEST PORT 1 VIEW  Result Date: 09/01/2020 CLINICAL DATA:  Status post central line placement. Congestive heart failure. EXAM: PORTABLE CHEST 1 VIEW COMPARISON:  09/01/2020 FINDINGS: Left IJ catheter tip is in the right atrium. No pneumothorax identified. Cardiac enlargement. Bilateral pleural effusions and mild interstitial edema identified compatible with CHF. IMPRESSION: No pneumothorax after left IJ catheter placement. Congestive heart failure. Electronically Signed   By: Signa Kell M.D.   On: 09/01/2020 11:31   DG CHEST PORT 1 VIEW  Result Date: 09/01/2020 CLINICAL DATA:  Dyspnea EXAM: PORTABLE CHEST 1 VIEW COMPARISON:  Chest radiograph 1 day prior FINDINGS: The heart is markedly enlarged,  unchanged. The mediastinal contours are stable. There is calcified atherosclerotic plaque of the aortic arch. There is dense retrocardiac opacity, not significantly changed. There is an associated left pleural effusion. There is a small right pleural effusion; aeration at the right base has improved in the interim. The upper lungs are well aerated. There is no pulmonary edema. There is no pneumothorax. The bones are stable. IMPRESSION: 1. Left pleural effusion with adjacent retrocardiac opacity which may reflect atelectasis or pneumonia, not significantly changed. 2. Unchanged small right pleural effusion with improved aeration at the right base. Electronically Signed   By: Lesia Hausen M.D.   On: 09/01/2020 08:04   DG CHEST PORT 1 VIEW  Result Date: 08/31/2020 CLINICAL DATA:  Shortness of breath. EXAM: PORTABLE CHEST 1 VIEW COMPARISON:  None. FINDINGS: Cardiomegaly. Layering pleural effusions, right greater than left with underlying opacity, likely atelectasis. No other abnormalities. IMPRESSION: Cardiomegaly and layering effusions with probable underlying atelectasis. Electronically Signed   By: Gerome Samavid  Williams III M.D.   On: 08/31/2020 12:06   ECHOCARDIOGRAM COMPLETE  Result Date: 09/01/2020    ECHOCARDIOGRAM REPORT   Patient Name:   Erica Duncan Date of Exam: 09/01/2020 Medical Rec #:  [id number]                 Height: Accession #:    [id number]                Weight:       134.0 lb Date of Birth:  07/07/1950                 BSA:          1.546 m Patient Age:    70 years                  BP:           100/68 mmHg Patient Gender: F                         HR:           71 bpm. Exam Location:  Inpatient Procedure: 2D Echo, Color Doppler and Cardiac Doppler Indications:    AF  History:        Patient has no prior history of Echocardiogram examinations.                 Stroke, Arrythmias:Atrial Fibrillation; Risk Factors:Diabetes,                 Hypertension and Dyslipidemia.  Sonographer:     Neomia DearAMARA CROWN RDCS Referring Phys: [id number] LAURA R INGOLD IMPRESSIONS  1. Left ventricular ejection fraction, by estimation, is 25 to 30%. The left ventricle has severely decreased function. The left ventricle demonstrates regional wall motion abnormalities (abnormal septal motiont-dykinetic in some views). Left ventricular diastolic parameters are indeterminate. LV contractility significantly reduced at 552 mm Hg.  2. Right ventricular systolic function is normal. The right ventricular size is normal. There is moderately elevated pulmonary artery systolic pressure. The estimated right ventricular systolic pressure is 49.5 mmHg.  3. Left atrial size was severely dilated.  4. Right atrial size was severely dilated.  5. A small pericardial effusion is present. Moderate pleural effusion.  6. The mitral valve is abnormal with anterior leaflet thickening, chordal thickening, and restricted movement- inflammatory or rheumatic disease is suggested. At least moderate mitral valve regurgitation. The mean mitral valve gradient is 3.0 mmHg with average heart rate of 58 bpm. MVA 2.3 cm2 by PHT. Continuity equation assessment deferred in the setting of significant MR.  7. Tricuspid valve regurgitation is severe; in the RV focused views there is some evidence of lack of coaptation with the septal leaflet.  8. The aortic valve is tricuspid. There is moderate calcification of the aortic valve. Aortic valve regurgitation is not visualized. Comparison(s): No prior Echocardiogram. FINDINGS  Left Ventricle: Left ventricular ejection fraction, by estimation, is 25 to 30%. The left ventricle has severely decreased function. The left ventricle demonstrates regional wall motion abnormalities. The left ventricular internal cavity size was normal  in size. There is no left ventricular hypertrophy. Left ventricular diastolic parameters are indeterminate.  LV Wall Scoring: The inferior septum is dyskinetic. Right Ventricle: The right ventricular  size is normal. Right vetricular wall thickness was not well visualized. Right ventricular systolic function is normal. There is moderately elevated pulmonary artery systolic pressure. The tricuspid regurgitant velocity is 3.22 m/s, and with an assumed right atrial pressure of 8 mmHg, the estimated right ventricular systolic pressure is 49.5 mmHg. Left Atrium: Left atrial size was severely dilated. Right Atrium: Right atrial size was severely dilated. Pericardium: A small pericardial effusion is present. Mitral Valve: The mitral valve is abnormal. Moderate mitral valve regurgitation. Moderate to severe mitral valve stenosis. MV peak gradient, 9.4 mmHg. The mean mitral valve gradient is 3.0 mmHg with average heart rate of 58 bpm. Tricuspid Valve: The tricuspid valve is abnormal. Tricuspid valve regurgitation is severe. No evidence of tricuspid stenosis. Aortic Valve: The aortic valve is tricuspid. There is moderate calcification of the aortic valve. Aortic valve regurgitation is not visualized. Aortic valve mean gradient measures 5.0 mmHg. Aortic valve peak gradient measures 9.5 mmHg. Aortic valve area,  by VTI measures 1.25 cm. Pulmonic Valve: The pulmonic valve was normal in structure. Pulmonic valve regurgitation is mild. No evidence of pulmonic stenosis. Aorta: The aortic root is normal in size and structure. IAS/Shunts: The interatrial septum was not well visualized. Additional Comments: There is a moderate pleural effusion.  LEFT VENTRICLE PLAX 2D LVIDd:         4.90 cm     Diastology LVIDs:         5.10 cm     LV e' medial:  5.11 cm/s LV PW:         0.80 cm     LV e' lateral: 9.14 cm/s LV IVS:        0.70 cm LVOT diam:     1.60 cm LV SV:         31 LV SV Index:   20 LVOT Area:     2.01 cm  LV Volumes (MOD) LV vol d, MOD A4C: 89.3 ml LV vol s, MOD A4C: 61.5 ml LV SV MOD A4C:     89.3 ml RIGHT VENTRICLE RV Basal diam:  3.60 cm RV Mid diam:    2.10 cm RV S prime:     9.79 cm/s TAPSE (M-mode): 1.9 cm LEFT ATRIUM               Index       RIGHT ATRIUM           Index LA diam:        4.30 cm  2.78 cm/m  RA Area:     20.60 cm LA Vol (A2C):   124.0 ml 80.19 ml/m RA Volume:   59.60 ml  38.54 ml/m LA Vol (A4C):   63.5 ml  41.06 ml/m LA Biplane Vol: 90.5 ml  58.52 ml/m  AORTIC VALVE                   PULMONIC VALVE AV Area (Vmax):    1.39 cm    PV Vmax:          0.98 m/s AV Area (Vmean):   1.28 cm    PV Vmean:         59.400 cm/s AV Area (VTI):     1.25 cm    PV VTI:           0.106 m AV Vmax:           154.50 cm/s PV Peak grad:     3.8 mmHg AV Vmean:  99.450 cm/s PV Mean grad:     2.0 mmHg AV VTI:            0.248 m     PR End Diast Vel: 4.58 msec AV Peak Grad:      9.5 mmHg AV Mean Grad:      5.0 mmHg LVOT Vmax:         107.00 cm/s LVOT Vmean:        63.500 cm/s LVOT VTI:          0.154 m LVOT/AV VTI ratio: 0.62  AORTA Ao Root diam: 2.60 cm Ao Asc diam:  2.70 cm MITRAL VALVE                   TRICUSPID VALVE MV Area VTI:  0.64 cm         TR Peak grad:   41.5 mmHg MV Peak grad: 9.4 mmHg         TR Vmax:        322.00 cm/s MV Mean grad: 3.0 mmHg MV Vmax:      1.53 m/s         SHUNTS MV Vmean:     76.6 cm/s        Systemic VTI:  0.15 m MR Peak grad:      64.6 mmHg   Systemic Diam: 1.60 cm MR Mean grad:      39.0 mmHg MR Vmax:           402.00 cm/s MR Vmean:          290.0 cm/s MR Vena Contracta: 0.30 cm MR PISA:           3.08 cm MR PISA Eff ROA:   18 mm MR PISA Radius:    0.70 cm Riley Lam MD Electronically signed by Riley Lam MD Signature Date/Time: 09/01/2020/11:42:34 AM    Final      Medications:     Scheduled Medications:  aspirin EC  81 mg Oral Daily   Chlorhexidine Gluconate Cloth  6 each Topical Daily   furosemide  80 mg Intravenous BID   insulin aspart  0-15 Units Subcutaneous TID WC   insulin aspart  0-5 Units Subcutaneous QHS   insulin glargine-yfgn  15 Units Subcutaneous QHS   lactulose  20 g Oral TID   sodium chloride flush  10-40 mL Intracatheter Q12H   sodium  chloride flush  3 mL Intravenous Once    Infusions:  sodium chloride     amiodarone 60 mg/hr (09/02/20 0600)   heparin 850 Units/hr (09/02/20 0600)   levETIRAcetam Stopped (09/02/20 0425)   milrinone 0.375 mcg/kg/min (09/02/20 0600)   norepinephrine (LEVOPHED) Adult infusion 20 mcg/min (09/02/20 0600)   sodium chloride      PRN Medications: Place/Maintain arterial line **AND** sodium chloride, LORazepam, ondansetron (ZOFRAN) IV, polyethylene glycol, sodium chloride, sodium chloride flush   Assessment/Plan :   1. Acute systolic HF -> cardiogenic shock -New diagnosis this admit -EF ~ 20-35%, MR, TR on echo - Initial lactate 4.8 -> 1.5. Co-ox 41% on 8/15 - Co-ox now 52% on NE 20 and milrinone 0.375. Will repeat  - Acidosis resolving. End-organ function stabilizing - will eventually need R/L cath when creatinine improves - No GDMT yet due to shock - Volume up. Continue IV diuresis   2. Atrial fibrillation with RVR: -Was in NSR on admit. Noted 08/14. In/out AF on 8/15 - Now in NSR Remains on IV amio. Will continue - Continue Heparin gtt.  3. AKI on CKD, stage IIIb - Likely due to ATN in setting of #1 - Cr 1.74 > 2.27 -> 2.7 -> 2.7 - Improving with hemodynamic support   4. Shock liver -Stabilizing with hemodynamic support   5. Insulin dependent DM II -SSI -Alc 7.9   7. HLD -No statin until LFTs stabilize   8. Hx CVA/seizure activity -Followed by Neurology -On Keppra -No acute infarct on MRI brain and CT head   CRITICAL CARE Performed by: Arvilla Meres  Total critical care time: 35 minutes  Critical care time was exclusive of separately billable procedures and treating other patients.  Critical care was necessary to treat or prevent imminent or life-threatening deterioration.  Critical care was time spent personally by me (independent of midlevel providers or residents) on the following activities: development of treatment plan with patient and/or  surrogate as well as nursing, discussions with consultants, evaluation of patient's response to treatment, examination of patient, obtaining history from patient or surrogate, ordering and performing treatments and interventions, ordering and review of laboratory studies, ordering and review of radiographic studies, pulse oximetry and re-evaluation of patient's condition.   Length of Stay: 5   Arvilla Meres MD 09/02/2020, 6:40 AM  Advanced Heart Failure Team Pager [phone] (M-F; 7a - 4p)  Please contact [facility] for night-coverage after hours (4p -7a ) and weekends on [email or web address]

## 2020-09-02 NOTE — Progress Notes (Signed)
Physical Therapy Treatment Patient Details Name: Erica Duncan MRN: [id number] DOB: 06/24/1950 Today's Date: 09/02/2020    History of Present Illness 70yo female presenting to [facility] ED on 8/10 with acute-onset L weakness and confusion.  Pt was observed to have seizure like activity in ED.  MRI 8/11 with no acute findings.EEG negative for seizures. Hypotension and went into afib with RVR 8/14, PCCM and cardiology consulated transferred to ICU 8/15 for  cardiogenic shock. 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    PT Comments    Continuing work on functional mobility and activity tolerance;  Graciela, Medical Interpreter present and facilitated communication; Initially session was to focus on mobility and perhaps getting orthostatic BPs, howeer the focus shifted quickly to transfers and ADLs when she needed to get to the [facility] for BM; min assist of 2 to get to EOB; Mod assist of 2 for sit to stand and pivot transfers; Difficulty with weight shifting onto a stance LE to allow for advancement of other LE for stepping, but able to take a few steps to the chair with multimodal cueing;   BPs soft, and pt did report fatigue -- still, we must note that the session was approx 50 minutes of activity -- be it transfers, toileting, or cleaning up after, chanign gown and organizing her multiple lines and leads -- and she participated; Continue to recommend comprehensive inpatient rehab (CIR) for post-acute therapy needs.   Follow Up Recommendations  CIR     Equipment Recommendations  Rolling walker with 5" wheels;3in1 (PT);Wheelchair (measurements PT);Wheelchair cushion (measurements PT)    Recommendations for Other Services       Precautions / Restrictions Precautions Precautions: Fall Precaution Comments: watch HR    Mobility  Bed Mobility Overal bed mobility: Needs Assistance Bed  Mobility: Supine to Sit     Supine to sit: Min assist;+2 for physical assistance;+2 for safety/equipment Sit to supine: Min assist;+2 for safety/equipment;HOB elevated   General bed mobility comments: HOB elevated, min assist for trunk support and scoot foward with increased time and cueing to sequence    Transfers Overall transfer level: Needs assistance Equipment used: 2 person hand held assist Transfers: Sit to/from [employer] Sit to Stand: Mod assist;+2 physical assistance;+2 safety/equipment Stand pivot transfers: Mod assist;+2 physical assistance;+2 safety/equipment       General transfer comment: mod assist +2 to power up and steady from EOB and BSC, posterior lean and cueing for anterior shift; pivot to [facility] with increased cueing for L LE mgmt and shifting hips  Ambulation/Gait Ambulation/Gait assistance: Mod assist;+2 physical assistance;+2 safety/equipment Gait Distance (Feet): 2 Feet (pivotal steps BSC to recliner) Assistive device: 2 person hand held assist (Support given at bil elbows and low back) Gait Pattern/deviations: Decreased weight shift to right;Decreased weight shift to left     General Gait Details: Unable to weight shift enough to take meaningful steps getting from bed to [facility]; after using BSC adn cleaning up, took a few steps BSC to recliner; heavy mod asssist to fully weight shift onto stance LE and allow for stepping of advancing LE; about 4 steps   Stairs             Wheelchair Mobility    Modified Rankin (Stroke Patients Only)       Balance     Sitting balance-Leahy Scale: Fair       Standing balance-Leahy Scale: Poor  Cognition Arousal/Alertness: Awake/alert Behavior During Therapy: WFL for tasks assessed/performed Overall Cognitive Status: Impaired/Different from baseline Area of Impairment: Problem solving;Awareness;Safety/judgement;Following commands                        Following Commands: Follows one step commands consistently;Follows one step commands with increased time;Follows multi-step commands inconsistently Safety/Judgement: Decreased awareness of safety;Decreased awareness of deficits Awareness: Emergent Problem Solving: Slow processing;Difficulty sequencing;Requires verbal cues General Comments: pt requires increased time to sequence and follow mulitple step commands      Exercises      General Comments General comments (skin integrity, edema, etc.): daughter present and supportive, HR up to 133 during ADLs      Pertinent Vitals/Pain Pain Assessment: No/denies pain Faces Pain Scale: No hurt    Home Living                      Prior Function            PT Goals (current goals can now be found in the care plan section) Acute Rehab PT Goals Patient Stated Goal: per family to get better and go home PT Goal Formulation: With patient/family Time For Goal Achievement: 09/11/20 Potential to Achieve Goals: Good Progress towards PT goals: Not progressing toward goals - comment (Needing more assist this session, but particiapting despite fatigue)    Frequency    Min 3X/week      PT Plan Discharge plan needs to be updated    Co-evaluation PT/OT/SLP Co-Evaluation/Treatment: Yes Reason for Co-Treatment: Necessary to address cognition/behavior during functional activity;For patient/therapist safety;To address functional/ADL transfers PT goals addressed during session: Mobility/safety with mobility        AM-PAC PT "6 Clicks" Mobility   Outcome Measure  Help needed turning from your back to your side while in a flat bed without using bedrails?: A Little Help needed moving from lying on your back to sitting on the side of a flat bed without using bedrails?: A Little Help needed moving to and from a bed to a chair (including a wheelchair)?: A Lot Help needed standing up from a chair using your arms (e.g.,  wheelchair or bedside chair)?: A Lot Help needed to walk in hospital room?: A Lot Help needed climbing 3-5 steps with a railing? : Total 6 Click Score: 13    End of Session   Activity Tolerance: Patient tolerated treatment well Patient left: in chair;with call bell/phone within reach;with nursing/sitter in room;with family/visitor present Nurse Communication: Mobility status PT Visit Diagnosis: Muscle weakness (generalized) (M62.81)     Time: 2671-2458 PT Time Calculation (min) (ACUTE ONLY): 46 min  Charges:  $Therapeutic Activity: 8-22 mins                     Van Clines, PT  Acute Rehabilitation Services Pager [phone] Office [phone]    Levi Aland 09/02/2020, 7:11 PM

## 2020-09-02 NOTE — Progress Notes (Signed)
ANTICOAGULATION CONSULT NOTE - Initial Consult  Pharmacy Consult for heparin Indication: atrial fibrillation  No Known Allergies  Patient Measurements: Weight: 65.9 kg (145 lb 4.5 oz) (no blankets, patient's pillow, seizure pads on railings) Heparin Dosing Weight: 60kg  Vital Signs: Temp: 98.2 F (36.8 C) (08/16 0430) Temp Source: Oral (08/16 0430) BP: 101/56 (08/16 0700) Pulse Rate: 76 (08/16 0700)  Labs: Recent Labs    09/01/20 0337 09/01/20 0843 09/01/20 1214 09/01/20 1831 09/01/20 2023 09/01/20 2309 09/02/20 0416  HGB 14.2 14.6  --  13.6  --   --  12.1  HCT 46.2* 43.0  --  40.0  --   --  37.1  PLT 134*  --   --   --   --   --  159  HEPARINUNFRC  --   --   --   --  0.51  --  0.57  CREATININE 2.27*  --  2.81*  --   --  2.72* 2.75*     CrCl cannot be calculated (Unknown ideal weight.).   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 Swallow whole.   08/29/2020   glimepiride (AMARYL) 4 MG tablet Take 4 mg by mouth 2 (two) times daily.   09/14/2020   hydrochlorothiazide (HYDRODIURIL) 25 MG tablet Take 25 mg by mouth daily.   09/15/2020   JANUMET XR 50-500 MG TB24 Take 1 tablet by mouth daily.   09/12/2020   LANTUS SOLOSTAR 100 UNIT/ML Solostar Pen Inject 30 Units into the skin at bedtime.   08/26/2020   lisinopril (ZESTRIL) 40 MG tablet Take 40 mg by mouth daily.   09/01/2020   Multiple Vitamin (MULTIVITAMIN WITH MINERALS) TABS tablet Take 1 tablet by mouth daily.   09/05/2020   rosuvastatin (CRESTOR) 10 MG tablet Take 10 mg by mouth  daily.   08/22/2020    Assessment: 70 year old female admitted after seizure. ECHO on 8/15 revealed reduced EF (25-30%) and patient was volume overloaded and cold. Concern for low output, and was transitioned to IV heparin for afib. Noted muti-organ failure with shocked liver and AKI.   Heparin level stable at 0.57 on 850 units/hr. CBC stable - Hb appears at baseline, plt low but stable at 159. No s/sx of bleeding noted.  Goal of Therapy:  Heparin level 0.3-0.7 units/ml Monitor platelets by anticoagulation protocol: Yes   Plan:  Continue heparin infusion at 850 units/hr Daily heparin level and CBC  Monitor for s/sx of bleeding   Filbert Schilder, PharmD PGY1 Pharmacy Resident 09/02/2020  7:19 AM  Please check [email or web address] for unit-specific pharmacy phone numbers.

## 2020-09-02 NOTE — Progress Notes (Signed)
   Remains on milrinone 0.375 mcg + Norepi 20 mcg + amio drip at 60 mg per hour.   Converted to NSR around 1330  CVP 11  No appetite. Consult dieitian + diabetes coordinator.  Had large BM today.   Check ammonia in am.   Discussed with daughter at bedside.  Michel Hendon NP-C  4:22 PM

## 2020-09-02 NOTE — Progress Notes (Signed)
Occupational Therapy Treatment Patient Details Name: Erica Duncan MRN: [id number] DOB: 1950-12-10 Today's Date: 09/02/2020    History of present illness 70yo female presenting to [facility] ED on 8/10 with acute-onset L weakness and confusion.  Pt was observed to have seizure like activity in ED.  MRI 8/11 with no acute findings.EEG negative for seizures. Hypotension and went into afib with RVR 8/14, PCCM and cardiology consulated transferred to ICU 8/15 for  cardiogenic shock. 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   OT comments  Patient seen in ICU with PT, interpreter present.  Patient completing toilet transfers with mod assist +2, toileting with total assist (due to urgent BM), LB dressing with total assist and bathing with max assist.  Pt with HR up to 133 during ADLs, BP soft and fatigues easily during session.  Encouragement required to assist with ADL tasks, noted L lateral lean in sitting supported.  Updated dc plan to CIR.    Follow Up Recommendations  CIR    Equipment Recommendations  Other (comment) (TBD)    Recommendations for Other Services      Precautions / Restrictions Precautions Precautions: Fall Precaution Comments: watch HR Restrictions Weight Bearing Restrictions: No       Mobility Bed Mobility Overal bed mobility: Needs Assistance Bed Mobility: Supine to Sit     Supine to sit: Min assist;+2 for physical assistance;+2 for safety/equipment     General bed mobility comments: HOB elevated, min assist for trunk support and scoot foward with increased time and cueing to sequence    Transfers Overall transfer level: Needs assistance Equipment used: 2 person hand held assist Transfers: Sit to/from [employer] Sit to Stand: Mod assist;+2 physical assistance;+2 safety/equipment Stand pivot transfers: Mod assist;+2 physical  assistance;+2 safety/equipment       General transfer comment: mod assist +2 to power up and steady from EOB and BSC, posterior lean and cueing for anterior shift; pivot to [facility] with increased cueing for L LE mgmt and shifting hips    Balance Overall balance assessment: Needs assistance Sitting-balance support: No upper extremity supported;Feet supported Sitting balance-Leahy Scale: Fair Sitting balance - Comments: L lateral lean   Standing balance support: Bilateral upper extremity supported;During functional activity Standing balance-Leahy Scale: Poor Standing balance comment: relies on BUE and external support                           ADL either performed or assessed with clinical judgement   ADL Overall ADL's : Needs assistance/impaired             Lower Body Bathing: Maximal assistance;+2 for safety/equipment;+2 for physical assistance;Sit to/from stand       Lower Body Dressing: Total assistance;+2 for physical assistance;+2 for safety/equipment;Sit to/from stand   Toilet Transfer: Moderate assistance;+2 for physical assistance;+2 for safety/equipment;Stand-pivot Toilet Transfer Details (indicate cue type and reason): to [facility] Toileting- Clothing Manipulation and Hygiene: Total assistance;+2 for physical assistance;+2 for safety/equipment;Sit to/from stand Toileting - Clothing Manipulation Details (indicate cue type and reason): requires total assist, aware of need to use restroom but urgent with +BM     Functional mobility during ADLs: Moderate assistance;+2 for physical assistance;+2 for safety/equipment;Cueing for safety;Cueing for sequencing       Vision       Perception     Praxis      Cognition Arousal/Alertness: Awake/alert Behavior  During Therapy: WFL for tasks assessed/performed Overall Cognitive Status: Impaired/Different from baseline Area of Impairment: Problem solving;Awareness;Safety/judgement;Following commands                        Following Commands: Follows one step commands consistently;Follows one step commands with increased time;Follows multi-step commands inconsistently Safety/Judgement: Decreased awareness of safety;Decreased awareness of deficits Awareness: Emergent Problem Solving: Slow processing;Difficulty sequencing;Requires verbal cues General Comments: pt requires increased time to sequence and follow mulitple step commands        Exercises     Shoulder Instructions       General Comments daughter present and supportive, HR up to 133 during ADLs    Pertinent Vitals/ Pain       Pain Assessment: No/denies pain Faces Pain Scale: No hurt  Home Living                                          Prior Functioning/Environment              Frequency  Min 2X/week        Progress Toward Goals  OT Goals(current goals can now be found in the care plan section)  Progress towards OT goals: Progressing toward goals (slowly)  Acute Rehab OT Goals Patient Stated Goal: per family to get better and go home OT Goal Formulation: With family  Plan Frequency remains appropriate;Discharge plan needs to be updated    Co-evaluation    PT/OT/SLP Co-Evaluation/Treatment: Yes Reason for Co-Treatment: Necessary to address cognition/behavior during functional activity;To address functional/ADL transfers;For patient/therapist safety   OT goals addressed during session: ADL's and self-care      AM-PAC OT "6 Clicks" Daily Activity     Outcome Measure   Help from another person eating meals?: A Little Help from another person taking care of personal grooming?: A Little Help from another person toileting, which includes using toliet, bedpan, or urinal?: Total Help from another person bathing (including washing, rinsing, drying)?: Total Help from another person to put on and taking off regular upper body clothing?: A Lot Help from another person to put on and taking off  regular lower body clothing?: Total 6 Click Score: 11    End of Session    OT Visit Diagnosis: Other abnormalities of gait and mobility (R26.89);Muscle weakness (generalized) (M62.81);Other symptoms and signs involving cognitive function   Activity Tolerance Patient tolerated treatment well   Patient Left in chair;with call bell/phone within reach;with nursing/sitter in room;with family/visitor present   Nurse Communication Mobility status        Time: 0998-3382 OT Time Calculation (min): 46 min  Charges: OT General Charges $OT Visit: 1 Visit OT Treatments $Self Care/Home Management : 23-37 mins  Barry Brunner, OT Acute Rehabilitation Services Pager [phone] Office [phone]    Chancy Milroy 09/02/2020, 1:50 PM

## 2020-09-03 ENCOUNTER — Inpatient Hospital Stay (HOSPITAL_COMMUNITY): Payer: Medicare HMO

## 2020-09-03 DIAGNOSIS — I69398 Other sequelae of cerebral infarction: Secondary | ICD-10-CM | POA: Diagnosis not present

## 2020-09-03 DIAGNOSIS — G40909 Epilepsy, unspecified, not intractable, without status epilepticus: Secondary | ICD-10-CM | POA: Diagnosis not present

## 2020-09-03 LAB — COMPREHENSIVE METABOLIC PANEL
ALT: 621 U/L — ABNORMAL HIGH (ref 0–44)
AST: 632 U/L — ABNORMAL HIGH (ref 15–41)
Albumin: 2.3 g/dL — ABNORMAL LOW (ref 3.5–5.0)
Alkaline Phosphatase: 157 U/L — ABNORMAL HIGH (ref 38–126)
Anion gap: 9 (ref 5–15)
BUN: 61 mg/dL — ABNORMAL HIGH (ref 8–23)
CO2: 22 mmol/L (ref 22–32)
Calcium: 8.1 mg/dL — ABNORMAL LOW (ref 8.9–10.3)
Chloride: 102 mmol/L (ref 98–111)
Creatinine, Ser: 2.62 mg/dL — ABNORMAL HIGH (ref 0.44–1.00)
GFR, Estimated: 19 mL/min — ABNORMAL LOW (ref >60)
Glucose, Bld: 235 mg/dL — ABNORMAL HIGH (ref 70–99)
Potassium: 4 mmol/L (ref 3.5–5.1)
Sodium: 133 mmol/L — ABNORMAL LOW (ref 135–145)
Total Bilirubin: 1.9 mg/dL — ABNORMAL HIGH (ref 0.3–1.2)
Total Protein: 5.4 g/dL — ABNORMAL LOW (ref 6.5–8.1)

## 2020-09-03 LAB — CBC
HCT: 32.5 % — ABNORMAL LOW (ref 36.0–46.0)
HCT: 30.4 % — ABNORMAL LOW (ref 36.0–46.0)
Hemoglobin: 11 g/dL — ABNORMAL LOW (ref 12.0–15.0)
Hemoglobin: 9.9 g/dL — ABNORMAL LOW (ref 12.0–15.0)
MCH: 22.6 pg — ABNORMAL LOW (ref 26.0–34.0)
MCH: 22 pg — ABNORMAL LOW (ref 26.0–34.0)
MCHC: 33.8 g/dL (ref 30.0–36.0)
MCHC: 32.6 g/dL (ref 30.0–36.0)
MCV: 66.7 fL — ABNORMAL LOW (ref 80.0–100.0)
MCV: 67.6 fL — ABNORMAL LOW (ref 80.0–100.0)
Platelets: 160 10*3/uL (ref 150–400)
Platelets: 150 10*3/uL (ref 150–400)
RBC: 4.87 MIL/uL (ref 3.87–5.11)
RBC: 4.5 MIL/uL (ref 3.87–5.11)
RDW: 15.9 % — ABNORMAL HIGH (ref 11.5–15.5)
RDW: 15.7 % — ABNORMAL HIGH (ref 11.5–15.5)
WBC: 10.9 10*3/uL — ABNORMAL HIGH (ref 4.0–10.5)
WBC: 9.7 10*3/uL (ref 4.0–10.5)
nRBC: 1.8 % — ABNORMAL HIGH (ref 0.0–0.2)
nRBC: 1.1 % — ABNORMAL HIGH (ref 0.0–0.2)

## 2020-09-03 LAB — ABO/RH: ABO/RH(D): O POS

## 2020-09-03 LAB — TYPE AND SCREEN
ABO/RH(D): O POS
Antibody Screen: NEGATIVE

## 2020-09-03 LAB — HEPARIN LEVEL (UNFRACTIONATED): Heparin Unfractionated: 0.73 IU/mL — ABNORMAL HIGH (ref 0.30–0.70)

## 2020-09-03 LAB — AMMONIA: Ammonia: 28 umol/L (ref 9–35)

## 2020-09-03 LAB — GLUCOSE, CAPILLARY
Glucose-Capillary: 234 mg/dL — ABNORMAL HIGH (ref 70–99)
Glucose-Capillary: 260 mg/dL — ABNORMAL HIGH (ref 70–99)
Glucose-Capillary: 269 mg/dL — ABNORMAL HIGH (ref 70–99)
Glucose-Capillary: 209 mg/dL — ABNORMAL HIGH (ref 70–99)

## 2020-09-03 LAB — COOXEMETRY PANEL
Carboxyhemoglobin: 1.4 % (ref 0.5–1.5)
Methemoglobin: 0.8 % (ref 0.0–1.5)
O2 Saturation: 65.8 %
Total hemoglobin: 11 g/dL — ABNORMAL LOW (ref 12.0–16.0)

## 2020-09-03 MED ORDER — TRAMADOL HCL 50 MG PO TABS
50.0000 mg | ORAL_TABLET | Freq: Two times a day (BID) | ORAL | Status: DC | PRN
  Administered 2020-09-04 (×2): 50 mg via ORAL
  Filled 2020-09-03 (×2): qty 1

## 2020-09-03 MED ORDER — SODIUM CHLORIDE 0.9 % IV SOLN
250.0000 mg | Freq: Every day | INTRAVENOUS | Status: DC
  Administered 2020-09-03: 250 mg via INTRAVENOUS
  Filled 2020-09-03 (×2): qty 20

## 2020-09-03 MED ORDER — INSULIN GLARGINE-YFGN 100 UNIT/ML ~~LOC~~ SOLN
20.0000 [IU] | Freq: Every day | SUBCUTANEOUS | Status: DC
  Administered 2020-09-03 – 2020-09-07 (×5): 20 [IU] via SUBCUTANEOUS
  Filled 2020-09-03 (×9): qty 0.2

## 2020-09-03 MED ORDER — INSULIN ASPART 100 UNIT/ML IJ SOLN
4.0000 [IU] | Freq: Three times a day (TID) | INTRAMUSCULAR | Status: DC
  Administered 2020-09-03 – 2020-09-04 (×5): 4 [IU] via SUBCUTANEOUS

## 2020-09-03 MED ORDER — SODIUM CHLORIDE 0.9 % IV BOLUS
250.0000 mL | Freq: Once | INTRAVENOUS | Status: AC
  Administered 2020-09-03: 250 mL via INTRAVENOUS

## 2020-09-03 MED ORDER — VASOPRESSIN 20 UNITS/100 ML INFUSION FOR SHOCK
0.0000 [IU]/min | INTRAVENOUS | Status: DC
  Administered 2020-09-05: 0.03 [IU]/min via INTRAVENOUS
  Filled 2020-09-03 (×2): qty 100

## 2020-09-03 MED ORDER — MILRINONE LACTATE IN DEXTROSE 20-5 MG/100ML-% IV SOLN
0.1250 ug/kg/min | INTRAVENOUS | Status: DC
  Administered 2020-09-05: 0.125 ug/kg/min via INTRAVENOUS
  Filled 2020-09-03 (×2): qty 100

## 2020-09-03 NOTE — Progress Notes (Signed)
Inpatient Diabetes Program Recommendations  AACE/ADA: New Consensus Statement on Inpatient Glycemic Control   Target Ranges:  Prepandial:   less than 140 mg/dL      Peak postprandial:   less than 180 mg/dL (1-2 hours)      Critically ill patients:  140 - 180 mg/dL  Results for WAFAA, DEEMER (MRN [id number]) as of 09/03/2020 10:02  Ref. Range 09/02/2020 08:16 09/02/2020 11:01 09/02/2020 16:00 09/02/2020 21:50 09/03/2020 07:01  Glucose-Capillary Latest Ref Range: 70 - 99 mg/dL 035 (H) 009 (H) 381 (H) 180 (H) 234 (H)    Review of Glycemic Control  Diabetes history: DM2 Outpatient Diabetes medications: Lantus 30 units QHS, Amaryl 4 mg BID, Janumet 50-500 mg daily Current orders for Inpatient glycemic control: Semglee 18 units QHS, Novolog 0-15 units TID with meals, Novolog 0-5 units QHS  Inpatient Diabetes Program Recommendations:    Insulin: Please consider increasing Semglee to 20 units QHS and ordering Novolog 4 units TID with meals for meal coverage if patient eats at least 50% of meals.  Diet: If appropriate, please consider adding Carb Modified to Dys 1 diet.  Thanks, Orlando Penner, RN, MSN, CDE Diabetes Coordinator Inpatient Diabetes Program [phone] (Team Pager from 8am to 5pm)

## 2020-09-03 NOTE — Progress Notes (Signed)
ANTICOAGULATION CONSULT NOTE - Initial Consult  Pharmacy Consult for heparin Indication: atrial fibrillation  No Known Allergies  Patient Measurements: Height: 5' (152.4 cm) Weight: 67 kg (147 lb 11.3 oz) IBW/kg (Calculated) : 45.5 Heparin Dosing Weight: 60kg  Vital Signs: Temp: 98 F (36.7 C) (08/17 0812) Temp Source: Oral (08/17 0812) BP: 108/45 (08/17 0800) Pulse Rate: 81 (08/17 0800)  Labs: Recent Labs    09/01/20 0337 09/01/20 0843 09/01/20 1831 09/01/20 2023 09/01/20 2309 09/02/20 0416 09/03/20 0303  HGB 14.2   < > 13.6  --   --  12.1 11.0*  HCT 46.2*   < > 40.0  --   --  37.1 32.5*  PLT 134*  --   --   --   --  159 160  HEPARINUNFRC  --   --   --  0.51  --  0.57 0.73*  CREATININE 2.27*   < >  --   --  2.72* 2.75* 2.62*   < > = values in this interval not displayed.     Estimated Creatinine Clearance: 16.8 mL/min (A) (by C-G formula based on SCr of 2.62 mg/dL (H)).   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 Swallow whole.   08/23/2020   glimepiride (AMARYL) 4 MG tablet Take 4 mg by mouth 2 (two) times daily.   09/12/2020   hydrochlorothiazide (HYDRODIURIL) 25 MG tablet Take 25 mg by mouth daily.   09/13/2020   JANUMET XR 50-500 MG TB24 Take 1 tablet by mouth daily.   09/15/2020   LANTUS SOLOSTAR 100 UNIT/ML Solostar Pen Inject 30 Units into the skin at bedtime.   08/26/2020   lisinopril (ZESTRIL) 40 MG tablet Take 40 mg by mouth daily.   09/04/2020   Multiple Vitamin (MULTIVITAMIN WITH MINERALS) TABS tablet Take 1 tablet  by mouth daily.   09/04/2020   rosuvastatin (CRESTOR) 10 MG tablet Take 10 mg by mouth daily.   08/21/2020    Assessment: 70 year old female admitted after seizure. ECHO on 8/15 revealed reduced EF (25-30%) and patient was volume overloaded and cold. Concern for low output, and was transitioned to IV heparin for afib. Noted muti-organ failure with shocked liver and AKI.   Heparin level supratherapeutic at 0.73 on 850 units/hr. CBC stable - Hb appears at baseline, plt low but stable at 160. No s/sx of bleeding noted per RN.  Goal of Therapy:  Heparin level 0.3-0.7 units/ml Monitor platelets by anticoagulation protocol: Yes   Plan:  Decrease heparin infusion to 800 units/hr Daily heparin level and CBC  Monitor for s/sx of bleeding   Drake Leach, PharmD PGY2 Cardiology Pharmacy Resident Phone: [phone] 09/03/2020  9:00 AM  Please check [email or web address] for unit-specific pharmacy phone numbers.

## 2020-09-03 NOTE — Care Plan (Signed)
This 70 yrs old Female with A. 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  Patient is under ICU care.

## 2020-09-03 NOTE — Plan of Care (Signed)
  Problem: Clinical Measurements: Goal: Ability to maintain clinical measurements within normal limits will improve Outcome: Progressing Goal: Respiratory complications will improve Outcome: Progressing   Problem: Activity: Goal: Risk for activity intolerance will decrease Outcome: Progressing   

## 2020-09-03 NOTE — Progress Notes (Signed)
Advanced Heart Failure Rounding Note   Subjective:    NE increased from 20 -> 39. On milrinone 0.375. Co-ox 66%  Had RLE pain overnight now improved. Remains in NSR on IV amio.   Good urine output . Renal function and LFTs improving  Objective:   Weight Range:  Vital Signs:   Temp:  [97.6 F (36.4 C)-97.9 F (36.6 C)] 97.9 F (36.6 C) (08/16 2300) Pulse Rate:  [76-122] 84 (08/17 0630) Resp:  [15-32] 18 (08/17 0630) BP: (81-115)/(43-75) 115/54 (08/17 0600) SpO2:  [86 %-99 %] 95 % (08/17 0630) Arterial Line BP: (93-139)/(44-78) 103/49 (08/17 0630) Weight:  [67 kg] 67 kg (08/17 0500) Last BM Date: 08/29/20  Weight change: Filed Weights   08/28/20 0010 09/02/20 0500 09/03/20 0500  Weight: 60.8 kg 65.9 kg 67 kg    Intake/Output:   Intake/Output Summary (Last 24 hours) at 09/03/2020 0720 Last data filed at 09/03/2020 0600 Gross per 24 hour  Intake 1868 ml  Output 2000 ml  Net -132 ml      Physical Exam: General:  Lying flat in bed. Weak appearing. No resp difficulty HEENT: normal Neck: supple. JVP 10. LIJ TLC Carotids 2+ bilat; no bruits. No lymphadenopathy or thryomegaly appreciated. Cor: PMI nondisplaced. Regular rate & rhythm. No rubs, gallops or murmurs. Lungs: clear Abdomen: soft, mildly tender, nondistended. No hepatosplenomegaly. No bruits or masses. Good bowel sounds. Extremities: no cyanosis, clubbing, rash, tr-1+ edema Neuro: alert & orientedx3, cranial nerves grossly intact. moves all 4 extremities w/o difficulty. Affect pleasant   Telemetry: NSR 70-80 Personally reviewed   Labs: Basic Metabolic Panel: Recent Labs  Lab 08/29/20 0415 08/30/20 0433 08/31/20 0923 09/01/20 0337 09/01/20 0843 09/01/20 1214 09/01/20 1831 09/01/20 2309 09/02/20 0416 09/03/20 0303  NA 138 137 139 136   < > 138 135 133* 133* 133*  K 4.0 3.9 3.6 4.7   < > 4.2 3.9 3.7 3.4* 4.0  CL 105 103 108 107  --  106  --  102 102 102  CO2 21* 22 19* 10*  --  15*  --  18*  19* 22  GLUCOSE 123* 183* 119* 127*  --  196*  --  277* 266* 235*  BUN 24* 29* 33* 47*  --  52*  --  60* 59* 61*  CREATININE 1.66* 1.57* 1.81* 2.27*  --  2.81*  --  2.72* 2.75* 2.62*  CALCIUM 8.9 9.2 8.5* 8.8*  --  8.6*  --  8.4* 8.3* 8.1*  MG 1.5* 1.8 1.8 1.8  --   --   --   --   --   --    < > = values in this interval not displayed.     Liver Function Tests: Recent Labs  Lab 08/29/20 0415 09/01/20 0337 09/01/20 1214 09/02/20 0416 09/03/20 0303  AST 31 889* 1,418* 1,758* 632*  ALT 20 410* 651* 857* 621*  ALKPHOS 49 229* 191* 163* 157*  BILITOT 1.3* 2.2* 2.3* 1.6* 1.9*  PROT 5.8* 5.9* 5.5* 5.2* 5.4*  ALBUMIN 2.8* 2.5* 2.4* 2.2* 2.3*    No results for input(s): LIPASE, AMYLASE in the last 168 hours. Recent Labs  Lab 08/29/20 1220 09/01/20 1501 09/03/20 0313  AMMONIA 28 73* 28     CBC: Recent Labs  Lab 08/28/20 0001 08/28/20 0012 08/29/20 0415 08/30/20 0433 09/01/20 0337 09/01/20 0843 09/01/20 1831 09/02/20 0416 09/03/20 0303  WBC 6.4   < > 8.9 8.2 9.6  --   --  9.7 10.9*  NEUTROABS 4.6  --  7.4 6.3 8.0*  --   --   --   --   HGB 12.5   < > 11.6* 12.0 14.2 14.6 13.6 12.1 11.0*  HCT 41.6   < > 37.4 37.6 46.2* 43.0 40.0 37.1 32.5*  MCV 73.4*   < > 72.2* 70.9* 73.1*  --   --  68.5* 66.7*  PLT 165   < > 115* 132* 134*  --   --  159 160   < > = values in this interval not displayed.     Cardiac Enzymes: No results for input(s): CKTOTAL, CKMB, CKMBINDEX, TROPONINI in the last 168 hours.  BNP: BNP (last 3 results) No results for input(s): BNP in the last 8760 hours.  ProBNP (last 3 results) No results for input(s): PROBNP in the last 8760 hours.    Other results:  Imaging: US Abdomen Complete  Result Date: 09/01/2020 CLINICAL DATA:  Acute renal injury EXAM: ABDOMEN ULTRASOUND COMPLETE COMPARISON:  None. FINDINGS: Gallbladder: Gallbladder is well distended with gallbladder wall thickening to 5.7 mm. Positive sonographic Murphy's sign is elicited. No  cholelithiasis or gallbladder sludge is noted however. Common bile duct: Diameter: 4 mm Liver: No focal lesion identified. Within normal limits in parenchymal echogenicity. Portal vein is patent on color Doppler imaging with normal direction of blood flow towards the liver. IVC: No abnormality visualized. Pancreas: Visualized portion unremarkable. Spleen: Size and appearance within normal limits. Right Kidney: Length: 8.9 cm. Echogenicity within normal limits. No mass or hydronephrosis visualized. Left Kidney: Length: 9.4 cm. Echogenicity within normal limits. No mass or hydronephrosis visualized. Abdominal aorta: No aneurysm visualized. Other findings: Right-sided pleural effusion and mild ascites are seen. The wall thickening in the gallbladder may be reactive in nature given the ascites. IMPRESSION: Gallbladder wall thickening with positive sonographic Murphy's sign. The thickening may be reactive to the known ascites. The positive sonographic Murphy's sign is a complicating factor. If there is concern for cholecystitis, HIDA scan is recommended to assess for cystic duct patency. Right-sided pleural effusion and mild ascites. No other focal abnormality is noted. Electronically Signed   By: Alcide Clever M.D.   On: 09/01/2020 21:59   DG CHEST PORT 1 VIEW  Result Date: 09/01/2020 CLINICAL DATA:  Status post central line placement. Congestive heart failure. EXAM: PORTABLE CHEST 1 VIEW COMPARISON:  09/01/2020 FINDINGS: Left IJ catheter tip is in the right atrium. No pneumothorax identified. Cardiac enlargement. Bilateral pleural effusions and mild interstitial edema identified compatible with CHF. IMPRESSION: No pneumothorax after left IJ catheter placement. Congestive heart failure. Electronically Signed   By: Signa Kell M.D.   On: 09/01/2020 11:31   DG CHEST PORT 1 VIEW  Result Date: 09/01/2020 CLINICAL DATA:  Dyspnea EXAM: PORTABLE CHEST 1 VIEW COMPARISON:  Chest radiograph 1 day prior FINDINGS: The  heart is markedly enlarged, unchanged. The mediastinal contours are stable. There is calcified atherosclerotic plaque of the aortic arch. There is dense retrocardiac opacity, not significantly changed. There is an associated left pleural effusion. There is a small right pleural effusion; aeration at the right base has improved in the interim. The upper lungs are well aerated. There is no pulmonary edema. There is no pneumothorax. The bones are stable. IMPRESSION: 1. Left pleural effusion with adjacent retrocardiac opacity which may reflect atelectasis or pneumonia, not significantly changed. 2. Unchanged small right pleural effusion with improved aeration at the right base. Electronically Signed   By: Lesia Hausen M.D.   On: 09/01/2020  08:04   ECHOCARDIOGRAM COMPLETE  Result Date: 09/01/2020    ECHOCARDIOGRAM REPORT   Patient Name:   LYANDRA MCPHAIL [facility] Date of Exam: 09/01/2020 Medical Rec #:  [id number]                 Height: Accession #:    [id number]                Weight:       134.0 lb Date of Birth:  August 11, 1950                 BSA:          1.546 m Patient Age:    70 years                  BP:           100/68 mmHg Patient Gender: F                         HR:           71 bpm. Exam Location:  Inpatient Procedure: 2D Echo, Color Doppler and Cardiac Doppler Indications:    AF  History:        Patient has no prior history of Echocardiogram examinations.                 Stroke, Arrythmias:Atrial Fibrillation; Risk Factors:Diabetes,                 Hypertension and Dyslipidemia.  Sonographer:    Neomia Dear RDCS Referring Phys: [id number] LAURA R INGOLD IMPRESSIONS  1. Left ventricular ejection fraction, by estimation, is 25 to 30%. The left ventricle has severely decreased function. The left ventricle demonstrates regional wall motion abnormalities (abnormal septal motiont-dykinetic in some views). Left ventricular diastolic parameters are indeterminate. LV contractility significantly reduced at 552 mm Hg.  2.  Right ventricular systolic function is normal. The right ventricular size is normal. There is moderately elevated pulmonary artery systolic pressure. The estimated right ventricular systolic pressure is 49.5 mmHg.  3. Left atrial size was severely dilated.  4. Right atrial size was severely dilated.  5. A small pericardial effusion is present. Moderate pleural effusion.  6. The mitral valve is abnormal with anterior leaflet thickening, chordal thickening, and restricted movement- inflammatory or rheumatic disease is suggested. At least moderate mitral valve regurgitation. The mean mitral valve gradient is 3.0 mmHg with average heart rate of 58 bpm. MVA 2.3 cm2 by PHT. Continuity equation assessment deferred in the setting of significant MR.  7. Tricuspid valve regurgitation is severe; in the RV focused views there is some evidence of lack of coaptation with the septal leaflet.  8. The aortic valve is tricuspid. There is moderate calcification of the aortic valve. Aortic valve regurgitation is not visualized. Comparison(s): No prior Echocardiogram. FINDINGS  Left Ventricle: Left ventricular ejection fraction, by estimation, is 25 to 30%. The left ventricle has severely decreased function. The left ventricle demonstrates regional wall motion abnormalities. The left ventricular internal cavity size was normal  in size. There is no left ventricular hypertrophy. Left ventricular diastolic parameters are indeterminate.  LV Wall Scoring: The inferior septum is dyskinetic. Right Ventricle: The right ventricular size is normal. Right vetricular wall thickness was not well visualized. Right ventricular systolic function is normal. There is moderately elevated pulmonary artery systolic pressure. The tricuspid regurgitant velocity is 3.22 m/s, and with an assumed right atrial pressure of 8 mmHg,  the estimated right ventricular systolic pressure is 49.5 mmHg. Left Atrium: Left atrial size was severely dilated. Right Atrium:  Right atrial size was severely dilated. Pericardium: A small pericardial effusion is present. Mitral Valve: The mitral valve is abnormal. Moderate mitral valve regurgitation. Moderate to severe mitral valve stenosis. MV peak gradient, 9.4 mmHg. The mean mitral valve gradient is 3.0 mmHg with average heart rate of 58 bpm. Tricuspid Valve: The tricuspid valve is abnormal. Tricuspid valve regurgitation is severe. No evidence of tricuspid stenosis. Aortic Valve: The aortic valve is tricuspid. There is moderate calcification of the aortic valve. Aortic valve regurgitation is not visualized. Aortic valve mean gradient measures 5.0 mmHg. Aortic valve peak gradient measures 9.5 mmHg. Aortic valve area,  by VTI measures 1.25 cm. Pulmonic Valve: The pulmonic valve was normal in structure. Pulmonic valve regurgitation is mild. No evidence of pulmonic stenosis. Aorta: The aortic root is normal in size and structure. IAS/Shunts: The interatrial septum was not well visualized. Additional Comments: There is a moderate pleural effusion.  LEFT VENTRICLE PLAX 2D LVIDd:         4.90 cm     Diastology LVIDs:         5.10 cm     LV e' medial:  5.11 cm/s LV PW:         0.80 cm     LV e' lateral: 9.14 cm/s LV IVS:        0.70 cm LVOT diam:     1.60 cm LV SV:         31 LV SV Index:   20 LVOT Area:     2.01 cm  LV Volumes (MOD) LV vol d, MOD A4C: 89.3 ml LV vol s, MOD A4C: 61.5 ml LV SV MOD A4C:     89.3 ml RIGHT VENTRICLE RV Basal diam:  3.60 cm RV Mid diam:    2.10 cm RV S prime:     9.79 cm/s TAPSE (M-mode): 1.9 cm LEFT ATRIUM              Index       RIGHT ATRIUM           Index LA diam:        4.30 cm  2.78 cm/m  RA Area:     20.60 cm LA Vol (A2C):   124.0 ml 80.19 ml/m RA Volume:   59.60 ml  38.54 ml/m LA Vol (A4C):   63.5 ml  41.06 ml/m LA Biplane Vol: 90.5 ml  58.52 ml/m  AORTIC VALVE                   PULMONIC VALVE AV Area (Vmax):    1.39 cm    PV Vmax:          0.98 m/s AV Area (Vmean):   1.28 cm    PV Vmean:          59.400 cm/s AV Area (VTI):     1.25 cm    PV VTI:           0.106 m AV Vmax:           154.50 cm/s PV Peak grad:     3.8 mmHg AV Vmean:          99.450 cm/s PV Mean grad:     2.0 mmHg AV VTI:            0.248 m     PR End Diast Vel: 4.58 msec  AV Peak Grad:      9.5 mmHg AV Mean Grad:      5.0 mmHg LVOT Vmax:         107.00 cm/s LVOT Vmean:        63.500 cm/s LVOT VTI:          0.154 m LVOT/AV VTI ratio: 0.62  AORTA Ao Root diam: 2.60 cm Ao Asc diam:  2.70 cm MITRAL VALVE                   TRICUSPID VALVE MV Area VTI:  0.64 cm         TR Peak grad:   41.5 mmHg MV Peak grad: 9.4 mmHg         TR Vmax:        322.00 cm/s MV Mean grad: 3.0 mmHg MV Vmax:      1.53 m/s         SHUNTS MV Vmean:     76.6 cm/s        Systemic VTI:  0.15 m MR Peak grad:      64.6 mmHg   Systemic Diam: 1.60 cm MR Mean grad:      39.0 mmHg MR Vmax:           402.00 cm/s MR Vmean:          290.0 cm/s MR Vena Contracta: 0.30 cm MR PISA:           3.08 cm MR PISA Eff ROA:   18 mm MR PISA Radius:    0.70 cm Riley Lam MD Electronically signed by Riley Lam MD Signature Date/Time: 09/01/2020/11:42:34 AM    Final      Medications:     Scheduled Medications:  aspirin EC  81 mg Oral Daily   Chlorhexidine Gluconate Cloth  6 each Topical Daily   furosemide  80 mg Intravenous BID   insulin aspart  0-15 Units Subcutaneous TID WC   insulin aspart  0-5 Units Subcutaneous QHS   insulin glargine-yfgn  18 Units Subcutaneous QHS   lactulose  20 g Oral TID   sodium chloride flush  10-40 mL Intracatheter Q12H   sodium chloride flush  3 mL Intravenous Once    Infusions:  sodium chloride     amiodarone 60 mg/hr (09/03/20 0600)   ferric gluconate (FERRLECIT) IVPB     heparin 850 Units/hr (09/03/20 0600)   levETIRAcetam Stopped (09/03/20 0359)   milrinone 0.375 mcg/kg/min (09/03/20 0600)   norepinephrine (LEVOPHED) Adult infusion 39 mcg/min (09/03/20 0600)   sodium chloride      PRN Medications: Place/Maintain  arterial line **AND** sodium chloride, LORazepam, ondansetron (ZOFRAN) IV, polyethylene glycol, sodium chloride, sodium chloride flush   Assessment/Plan :   1. Acute systolic HF -> cardiogenic shock -New diagnosis this admit -EF ~ 20-35%, MR, TR on echo - Initial lactate 4.8 -> 1.5. Co-ox 41% on 8/15 - Co-ox now 52% on NE 20 and milrinone 0.375 - Acidosis resolved. End-organ function beginning to  improve but remains on high dose NE at 39 and milrinone 0.375 - Will continue inotropes. If pressor needs continue to escalate will need PA cath and Possible mechanical support - will eventually need R/L cath when creatinine improves - No GDMT yet due to shock - Volume up. Continue IV diuresis  - Hgb dropped. Repeat CBC this am   2. Atrial fibrillation with RVR: -Was in NSR on admit. Noted 08/14. In/out AF on 8/15 - Now in NSR Remains  on IV amio. Will continue - Continue Heparin gtt. Hgb down a bit  watch for bleeding  3. AKI on CKD, stage IIIb - Likely due to ATN in setting of #1 - Cr 1.74 > 2.27 -> 2.7 -> 2.7 -> 2.6 - Improving with hemodynamic support   4. Shock liver -Stabilizing with hemodynamic support   5. Insulin dependent DM II -SSI -Semglee -Alc 7.9   7. HLD -No statin until LFTs stabilize   8. Hx CVA/seizure activity -Followed by Neurology -On Keppra -No acute infarct on MRI brain and CT head  9. Microcytic anemia, iron-deficiens - iron sats low - give feraheme - watch for bleeding on heparin. Repeat CBC today    CRITICAL CARE Performed by: Arvilla MeresBensimhon, Shamira Toutant  Total critical care time: 35 minutes  Critical care time was exclusive of separately billable procedures and treating other patients.  Critical care was necessary to treat or prevent imminent or life-threatening deterioration.  Critical care was time spent personally by me (independent of midlevel providers or residents) on the following activities: development of treatment plan with patient and/or  surrogate as well as nursing, discussions with consultants, evaluation of patient's response to treatment, examination of patient, obtaining history from patient or surrogate, ordering and performing treatments and interventions, ordering and review of laboratory studies, ordering and review of radiographic studies, pulse oximetry and re-evaluation of patient's condition.   Length of Stay: 6   Arvilla Meresaniel Arthor Gorter MD 09/03/2020, 7:20 AM  Advanced Heart Failure Team Pager [phone] (M-F; 7a - 4p)  Please contact [facility] for night-coverage after hours (4p -7a ) and weekends on [email or web address]

## 2020-09-04 ENCOUNTER — Inpatient Hospital Stay (HOSPITAL_COMMUNITY): Payer: Medicare HMO

## 2020-09-04 DIAGNOSIS — I69398 Other sequelae of cerebral infarction: Secondary | ICD-10-CM | POA: Diagnosis not present

## 2020-09-04 DIAGNOSIS — G40909 Epilepsy, unspecified, not intractable, without status epilepticus: Secondary | ICD-10-CM | POA: Diagnosis not present

## 2020-09-04 DIAGNOSIS — I5021 Acute systolic (congestive) heart failure: Secondary | ICD-10-CM

## 2020-09-04 LAB — GLUCOSE, CAPILLARY
Glucose-Capillary: 129 mg/dL — ABNORMAL HIGH (ref 70–99)
Glucose-Capillary: 133 mg/dL — ABNORMAL HIGH (ref 70–99)
Glucose-Capillary: 132 mg/dL — ABNORMAL HIGH (ref 70–99)
Glucose-Capillary: 135 mg/dL — ABNORMAL HIGH (ref 70–99)
Glucose-Capillary: 113 mg/dL — ABNORMAL HIGH (ref 70–99)

## 2020-09-04 LAB — CBC
HCT: 28.4 % — ABNORMAL LOW (ref 36.0–46.0)
HCT: 27.7 % — ABNORMAL LOW (ref 36.0–46.0)
HCT: 28.3 % — ABNORMAL LOW (ref 36.0–46.0)
Hemoglobin: 9.5 g/dL — ABNORMAL LOW (ref 12.0–15.0)
Hemoglobin: 9.4 g/dL — ABNORMAL LOW (ref 12.0–15.0)
Hemoglobin: 9.3 g/dL — ABNORMAL LOW (ref 12.0–15.0)
MCH: 22.2 pg — ABNORMAL LOW (ref 26.0–34.0)
MCH: 22.5 pg — ABNORMAL LOW (ref 26.0–34.0)
MCH: 22.1 pg — ABNORMAL LOW (ref 26.0–34.0)
MCHC: 33.5 g/dL (ref 30.0–36.0)
MCHC: 33.9 g/dL (ref 30.0–36.0)
MCHC: 32.9 g/dL (ref 30.0–36.0)
MCV: 66.4 fL — ABNORMAL LOW (ref 80.0–100.0)
MCV: 66.3 fL — ABNORMAL LOW (ref 80.0–100.0)
MCV: 67.4 fL — ABNORMAL LOW (ref 80.0–100.0)
Platelets: 130 10*3/uL — ABNORMAL LOW (ref 150–400)
Platelets: 135 10*3/uL — ABNORMAL LOW (ref 150–400)
Platelets: 128 10*3/uL — ABNORMAL LOW (ref 150–400)
RBC: 4.28 MIL/uL (ref 3.87–5.11)
RBC: 4.18 MIL/uL (ref 3.87–5.11)
RBC: 4.2 MIL/uL (ref 3.87–5.11)
RDW: 15.9 % — ABNORMAL HIGH (ref 11.5–15.5)
RDW: 15.9 % — ABNORMAL HIGH (ref 11.5–15.5)
RDW: 15.9 % — ABNORMAL HIGH (ref 11.5–15.5)
WBC: 9.1 10*3/uL (ref 4.0–10.5)
WBC: 8.5 10*3/uL (ref 4.0–10.5)
WBC: 9.1 10*3/uL (ref 4.0–10.5)
nRBC: 1.1 % — ABNORMAL HIGH (ref 0.0–0.2)
nRBC: 1.2 % — ABNORMAL HIGH (ref 0.0–0.2)
nRBC: 1.5 % — ABNORMAL HIGH (ref 0.0–0.2)

## 2020-09-04 LAB — BASIC METABOLIC PANEL
Anion gap: 8 (ref 5–15)
BUN: 53 mg/dL — ABNORMAL HIGH (ref 8–23)
CO2: 25 mmol/L (ref 22–32)
Calcium: 8.3 mg/dL — ABNORMAL LOW (ref 8.9–10.3)
Chloride: 100 mmol/L (ref 98–111)
Creatinine, Ser: 2.21 mg/dL — ABNORMAL HIGH (ref 0.44–1.00)
GFR, Estimated: 23 mL/min — ABNORMAL LOW (ref >60)
Glucose, Bld: 172 mg/dL — ABNORMAL HIGH (ref 70–99)
Potassium: 3.5 mmol/L (ref 3.5–5.1)
Sodium: 133 mmol/L — ABNORMAL LOW (ref 135–145)

## 2020-09-04 LAB — COOXEMETRY PANEL
Carboxyhemoglobin: 1.9 % — ABNORMAL HIGH (ref 0.5–1.5)
Carboxyhemoglobin: 1.5 % (ref 0.5–1.5)
Carboxyhemoglobin: 1.8 % — ABNORMAL HIGH (ref 0.5–1.5)
Carboxyhemoglobin: 1.2 % (ref 0.5–1.5)
Methemoglobin: 1 % (ref 0.0–1.5)
Methemoglobin: 0.9 % (ref 0.0–1.5)
Methemoglobin: 0.8 % (ref 0.0–1.5)
Methemoglobin: 0.8 % (ref 0.0–1.5)
O2 Saturation: 95.1 %
O2 Saturation: 58.4 %
O2 Saturation: 68.8 %
O2 Saturation: 55.7 %
Total hemoglobin: 9.5 g/dL — ABNORMAL LOW (ref 12.0–16.0)
Total hemoglobin: 10.1 g/dL — ABNORMAL LOW (ref 12.0–16.0)
Total hemoglobin: 9.7 g/dL — ABNORMAL LOW (ref 12.0–16.0)
Total hemoglobin: 9.6 g/dL — ABNORMAL LOW (ref 12.0–16.0)

## 2020-09-04 LAB — COMPREHENSIVE METABOLIC PANEL
ALT: 363 U/L — ABNORMAL HIGH (ref 0–44)
AST: 200 U/L — ABNORMAL HIGH (ref 15–41)
Albumin: 2.1 g/dL — ABNORMAL LOW (ref 3.5–5.0)
Alkaline Phosphatase: 130 U/L — ABNORMAL HIGH (ref 38–126)
Anion gap: 12 (ref 5–15)
BUN: 56 mg/dL — ABNORMAL HIGH (ref 8–23)
CO2: 26 mmol/L (ref 22–32)
Calcium: 8.8 mg/dL — ABNORMAL LOW (ref 8.9–10.3)
Chloride: 98 mmol/L (ref 98–111)
Creatinine, Ser: 2.31 mg/dL — ABNORMAL HIGH (ref 0.44–1.00)
GFR, Estimated: 22 mL/min — ABNORMAL LOW (ref >60)
Glucose, Bld: 137 mg/dL — ABNORMAL HIGH (ref 70–99)
Potassium: 3.1 mmol/L — ABNORMAL LOW (ref 3.5–5.1)
Sodium: 136 mmol/L (ref 135–145)
Total Bilirubin: 1.6 mg/dL — ABNORMAL HIGH (ref 0.3–1.2)
Total Protein: 5.3 g/dL — ABNORMAL LOW (ref 6.5–8.1)

## 2020-09-04 LAB — MAGNESIUM: Magnesium: 1.5 mg/dL — ABNORMAL LOW (ref 1.7–2.4)

## 2020-09-04 LAB — ECHOCARDIOGRAM LIMITED
Height: 60 in
S' Lateral: 4.3 cm
Weight: 2373.91 oz

## 2020-09-04 LAB — HEPARIN LEVEL (UNFRACTIONATED): Heparin Unfractionated: <0.1 IU/mL — ABNORMAL LOW (ref 0.30–0.70)

## 2020-09-04 MED ORDER — SODIUM CHLORIDE 0.9 % IV SOLN: INTRAVENOUS | Status: DC

## 2020-09-04 MED ORDER — POTASSIUM CHLORIDE 10 MEQ/50ML IV SOLN
10.0000 meq | INTRAVENOUS | Status: AC
  Administered 2020-09-04 (×4): 10 meq via INTRAVENOUS
  Filled 2020-09-04 (×3): qty 50

## 2020-09-04 MED ORDER — SODIUM CHLORIDE 0.9% FLUSH
3.0000 mL | Freq: Two times a day (BID) | INTRAVENOUS | Status: DC
  Administered 2020-09-04: 3 mL via INTRAVENOUS

## 2020-09-04 MED ORDER — POTASSIUM CHLORIDE 10 MEQ/50ML IV SOLN
10.0000 meq | INTRAVENOUS | Status: AC
  Administered 2020-09-04 (×4): 10 meq via INTRAVENOUS
  Filled 2020-09-04 (×2): qty 50

## 2020-09-04 MED ORDER — POTASSIUM CHLORIDE 10 MEQ/50ML IV SOLN
10.0000 meq | INTRAVENOUS | Status: DC
Start: 2020-09-04 — End: 2020-09-04
  Filled 2020-09-04: qty 50

## 2020-09-04 MED ORDER — ENOXAPARIN SODIUM 30 MG/0.3ML IJ SOSY
30.0000 mg | PREFILLED_SYRINGE | INTRAMUSCULAR | Status: DC
  Administered 2020-09-04: 30 mg via SUBCUTANEOUS
  Filled 2020-09-04: qty 0.3

## 2020-09-04 MED ORDER — SODIUM CHLORIDE 0.9% FLUSH: 3.0000 mL | INTRAVENOUS | Status: DC | PRN

## 2020-09-04 MED ORDER — SODIUM CHLORIDE 0.9 % IV SOLN
INTRAVENOUS | Status: DC | PRN
  Administered 2020-09-05: 250 mL via INTRAVENOUS

## 2020-09-04 MED ORDER — SODIUM CHLORIDE 0.9 % IV SOLN
250.0000 mL | INTRAVENOUS | Status: DC | PRN
Start: 2020-09-04 — End: 2020-09-05

## 2020-09-04 MED ORDER — SODIUM CHLORIDE 0.9 % IV SOLN
250.0000 mg | Freq: Once | INTRAVENOUS | Status: AC
  Administered 2020-09-04: 250 mg via INTRAVENOUS
  Filled 2020-09-04: qty 20

## 2020-09-04 MED ORDER — VANCOMYCIN HCL 1500 MG/300ML IV SOLN
1500.0000 mg | Freq: Once | INTRAVENOUS | Status: AC
  Administered 2020-09-04: 1500 mg via INTRAVENOUS
  Filled 2020-09-04: qty 300

## 2020-09-04 MED ORDER — POTASSIUM CHLORIDE CRYS ER 20 MEQ PO TBCR: 40.0000 meq | EXTENDED_RELEASE_TABLET | Freq: Once | ORAL | Status: DC

## 2020-09-04 MED ORDER — ASPIRIN 81 MG PO CHEW
81.0000 mg | CHEWABLE_TABLET | ORAL | Status: AC
Start: 2020-09-05 — End: 2020-09-05
  Administered 2020-09-05: 81 mg via ORAL
  Filled 2020-09-04: qty 1

## 2020-09-04 MED ORDER — POTASSIUM CHLORIDE CRYS ER 20 MEQ PO TBCR
40.0000 meq | EXTENDED_RELEASE_TABLET | Freq: Once | ORAL | Status: DC
  Filled 2020-09-04: qty 2

## 2020-09-04 MED ORDER — MAGNESIUM SULFATE 2 GM/50ML IV SOLN
2.0000 g | Freq: Once | INTRAVENOUS | Status: AC
  Administered 2020-09-04: 2 g via INTRAVENOUS

## 2020-09-04 MED ORDER — VANCOMYCIN HCL IN DEXTROSE 1-5 GM/200ML-% IV SOLN
1000.0000 mg | INTRAVENOUS | Status: DC
  Administered 2020-09-06 – 2020-09-08 (×2): 1000 mg via INTRAVENOUS
  Filled 2020-09-04 (×2): qty 200

## 2020-09-04 MED ORDER — AMIODARONE LOAD VIA INFUSION
150.0000 mg | Freq: Once | INTRAVENOUS | Status: AC
  Administered 2020-09-04: 150 mg via INTRAVENOUS
  Filled 2020-09-04: qty 83.34

## 2020-09-04 MED ORDER — MAGNESIUM SULFATE 2 GM/50ML IV SOLN
2.0000 g | Freq: Once | INTRAVENOUS | Status: AC
  Administered 2020-09-04: 2 g via INTRAVENOUS
  Filled 2020-09-04: qty 50

## 2020-09-04 MED ORDER — SODIUM CHLORIDE 0.9 % IV SOLN
2.0000 g | INTRAVENOUS | Status: DC
  Administered 2020-09-04 – 2020-09-08 (×4): 2 g via INTRAVENOUS
  Filled 2020-09-04 (×4): qty 2

## 2020-09-04 NOTE — Progress Notes (Addendum)
SLP Cancellation Note  Patient Details Name: Erica Duncan MRN: [id number] DOB: [birth year]   Cancelled treatment:       Reason Eval/Treat Not Completed: Medical issues which prohibited therapy. Pt had chest compressions this am, now NPO for [name]. Pt was previously on a puree diet, but family and pt report being able to masticate soft foods. When pt resumes a diet recommend Dys 2/thin liquids. Will otherwise sign off.    Jestina Stephani, Riley Nearing 09/04/2020, 10:15 AM

## 2020-09-04 NOTE — Progress Notes (Addendum)
Advanced Heart Failure Rounding Note   Subjective:   8/17:  Diuresed with IV lasix.  Hypotensive with increased pressor requirements with Norepi up to 55 mcg. Given 250 cc NS bolus. Milrinone cut back to 0.125 mcg. Imrproved with NE back down to 30 mcg. Hgb down 9.9. Sent for stat CT abd --negative for bleed. Heparin stopped.   Remains on amio 60 mg per hour. Frequent NSVT  Remains on Norepi 30 mcg + milrinone 0.125 mcg. CO-OX 69%.   Denies nausea. Denies shortness breath.    Objective:   Weight Range:  Vital Signs:   Temp:  [98 F (36.7 C)-98.4 F (36.9 C)] 98.2 F (36.8 C) (08/18 0500) Pulse Rate:  [75-82] 78 (08/18 0500) Resp:  [11-26] 20 (08/18 0500) BP: (79-124)/(36-54) 109/54 (08/18 0500) SpO2:  [90 %-98 %] 92 % (08/18 0500) Arterial Line BP: (93-134)/(38-75) 94/41 (08/18 0500) Weight:  [67.3 kg] 67.3 kg (08/18 0500) Last BM Date: 09/02/20  Weight change: Filed Weights   09/02/20 0500 09/03/20 0500 09/04/20 0500  Weight: 65.9 kg 67 kg 67.3 kg    Intake/Output:   Intake/Output Summary (Last 24 hours) at 09/04/2020 0710 Last data filed at 09/04/2020 0500 Gross per 24 hour  Intake 2784.53 ml  Output 3800 ml  Net -1015.47 ml    CVP 6-7  Physical Exam: General:Sitting in the chair eating breakfast. No resp difficulty HEENT: normal Neck: supple. JVP 6-7 . Carotids 2+ bilat; no bruits. No lymphadenopathy or thryomegaly appreciated. LIJ  Cor: PMI nondisplaced. Regular rate & rhythm. No rubs, gallops or murmurs. Lungs: RLL and LLL crackles on room air.  Abdomen: soft, nontender, nondistended. No hepatosplenomegaly. No bruits or masses. Good bowel sounds. Extremities: no cyanosis, clubbing, rash, R and LLE trace edema Neuro: alert & orientedx3, cranial nerves grossly intact. moves all 4 extremities w/o difficulty. Affect pleasant   Telemetry: NSR with frequent NSVT /PVCs    Labs: Basic Metabolic Panel: Recent Labs  Lab 08/29/20 0415 08/30/20 0433  08/31/20 0923 09/01/20 0337 09/01/20 0843 09/01/20 1214 09/01/20 1831 09/01/20 2309 09/02/20 0416 09/03/20 0303 09/04/20 0428  NA 138 137 139 136   < > 138 135 133* 133* 133* 136  K 4.0 3.9 3.6 4.7   < > 4.2 3.9 3.7 3.4* 4.0 3.1*  CL 105 103 108 107  --  106  --  102 102 102 98  CO2 21* 22 19* 10*  --  15*  --  18* 19* 22 26  GLUCOSE 123* 183* 119* 127*  --  196*  --  277* 266* 235* 137*  BUN 24* 29* 33* 47*  --  52*  --  60* 59* 61* 56*  CREATININE 1.66* 1.57* 1.81* 2.27*  --  2.81*  --  2.72* 2.75* 2.62* 2.31*  CALCIUM 8.9 9.2 8.5* 8.8*  --  8.6*  --  8.4* 8.3* 8.1* 8.8*  MG 1.5* 1.8 1.8 1.8  --   --   --   --   --   --   --    < > = values in this interval not displayed.    Liver Function Tests: Recent Labs  Lab 09/01/20 0337 09/01/20 1214 09/02/20 0416 09/03/20 0303 09/04/20 0428  AST 889* 1,418* 1,758* 632* 200*  ALT 410* 651* 857* 621* 363*  ALKPHOS 229* 191* 163* 157* 130*  BILITOT 2.2* 2.3* 1.6* 1.9* 1.6*  PROT 5.9* 5.5* 5.2* 5.4* 5.3*  ALBUMIN 2.5* 2.4* 2.2* 2.3* 2.1*   No results for  input(s): LIPASE, AMYLASE in the last 168 hours. Recent Labs  Lab 08/29/20 1220 09/01/20 1501 09/03/20 0313  AMMONIA 28 73* 28    CBC: Recent Labs  Lab 08/29/20 0415 08/30/20 0433 09/01/20 0337 09/01/20 0843 09/02/20 0416 09/03/20 0303 09/03/20 1105 09/03/20 2245 09/04/20 0428  WBC 8.9 8.2 9.6  --  9.7 10.9* 9.7 9.1 8.5  NEUTROABS 7.4 6.3 8.0*  --   --   --   --   --   --   HGB 11.6* 12.0 14.2   < > 12.1 11.0* 9.9* 9.5* 9.4*  HCT 37.4 37.6 46.2*   < > 37.1 32.5* 30.4* 28.4* 27.7*  MCV 72.2* 70.9* 73.1*  --  68.5* 66.7* 67.6* 66.4* 66.3*  PLT 115* 132* 134*  --  159 160 150 130* 135*   < > = values in this interval not displayed.    Cardiac Enzymes: No results for input(s): CKTOTAL, CKMB, CKMBINDEX, TROPONINI in the last 168 hours.  BNP: BNP (last 3 results) No results for input(s): BNP in the last 8760 hours.  ProBNP (last 3 results) No results for  input(s): PROBNP in the last 8760 hours.    Other results:  Imaging: No results found.   Medications:     Scheduled Medications:  aspirin EC  81 mg Oral Daily   Chlorhexidine Gluconate Cloth  6 each Topical Daily   furosemide  80 mg Intravenous BID   insulin aspart  0-15 Units Subcutaneous TID WC   insulin aspart  0-5 Units Subcutaneous QHS   insulin aspart  4 Units Subcutaneous TID WC   insulin glargine-yfgn  20 Units Subcutaneous QHS   sodium chloride flush  10-40 mL Intracatheter Q12H   sodium chloride flush  3 mL Intravenous Once    Infusions:  sodium chloride     amiodarone 60 mg/hr (09/04/20 0616)   ferric gluconate (FERRLECIT) IVPB Stopped (09/03/20 1415)   levETIRAcetam Stopped (09/04/20 0444)   milrinone 0.125 mcg/kg/min (09/04/20 0500)   norepinephrine (LEVOPHED) Adult infusion 30 mcg/min (09/04/20 0500)   sodium chloride     vasopressin      PRN Medications: Place/Maintain arterial line **AND** sodium chloride, LORazepam, ondansetron (ZOFRAN) IV, polyethylene glycol, sodium chloride, sodium chloride flush, traMADol   Assessment/Plan :   1. Acute systolic HF -> cardiogenic shock -New diagnosis this admit -EF ~ 20-35%, MR, TR on echo - Initial lactate 4.8 -> 1.5. Co-ox 41% on 8/15 Overnight increased pressor requirement. Improved with NS bolus and cutting back milrione ot 0.125 mcg. Norep coming down to 30 mcg.  - Co-ox  69%. NE   and milrinone 0.125 mcg  - Acidosis resolved. End-organ function improving  but remains on high dose NE at 30 and milrinone 0.125 -Co-ox stable 69%.  - CVP 6-7. Brisk diuresis. Replace electrolytes. Hold lasix and watch CVP today may need to add back lasix.  - Will continue inotropes. If pressor needs continue to escalate will need PA cath and Possible mechanical support - will eventually need R/L cath when creatinine improves - No GDMT yet due to shock - Repeat BMET 1300   2. Atrial fibrillation with RVR: -Was in NSR  on admit. Noted 08/14. In/out AF on 8/15 - In SR with frequent NSVT. Continue IV amio.  - Off Heparin drip with drop in hgb.  - No obvious source of bleeding.   3. AKI on CKD, stage IIIb - Likely due to ATN in setting of #1 - Cr 1.74 > 2.27 ->  2.7 -> 2.7 -> 2.6->2.3  - Improving with hemodynamic support   4. Shock liver -Stabilizing with hemodynamic support - Stop lactulose.    5. Insulin dependent DM II -SSI -Semglee -Alc 7.9   7. HLD -No statin until LFTs stabilize   8. Hx CVA/seizure activity -Followed by Neurology -On Keppra -No acute infarct on MRI brain and CT head  9. Microcytic anemia, iron-deficiens - iron sats low - given IV Iron 8/17 -Off heparin drip  -  hgb drifting down 12>11>9.9>9.4   - CT abd negative for bleed.  - No obvious source of bleeding.   10.NSVT -On amio drip will continue.  -K 3.1 will replace K.  -Check mag now.  -Repeat BMET at 1300   Check blood CX x2.   Length of Stay: 7   Amy Clegg  NP-C  09/04/2020, 7:10 AM  Advanced Heart Failure Team Pager [phone] (M-F; 7a - 4p)  Please contact [facility] for night-coverage after hours (4p -7a ) and weekends on [email or web address]  Agree with above.  Was hypotensive over night despite high-dose pressors. Given some IVFs.  Lasix stopped. HGb has been dropping - CBC stable this am.  Co-ox ox. Lactic acidosis resolved. Renal and hepatic function improving.   CT C/A/P + pulmonary edema and effusions. No RP bleed.   General:  Weak appearing. No resp difficulty HEENT: normal Neck: supple. JVP to jaw  Carotids 2+ bilat; no bruits. No lymphadenopathy or thryomegaly appreciated. Cor: PMI nondisplaced. Regular rate & rhythm. No rubs, gallops or murmurs. Lungs: clear Abdomen: soft, mildly  tender, nondistended. No hepatosplenomegaly. No bruits or masses. Good bowel sounds. Extremities: no cyanosis, clubbing, rash, edema Neuro: alert & orientedx3, cranial nerves grossly intact. moves all 4  extremities w/o difficulty. Affect pleasant  Remains very tenuous. Continue current support. Will try to wean inotropes as tolerated. Watch CBC. I still worry about need for advanced therapies. Hold heparin for now.   CRITICAL CARE Performed by: Arvilla Meres  Total critical care time: 35 minutes  Critical care time was exclusive of separately billable procedures and treating other patients.  Critical care was necessary to treat or prevent imminent or life-threatening deterioration.  Critical care was time spent personally by me (independent of midlevel providers or residents) on the following activities: development of treatment plan with patient and/or surrogate as well as nursing, discussions with consultants, evaluation of patient's response to treatment, examination of patient, obtaining history from patient or surrogate, ordering and performing treatments and interventions, ordering and review of laboratory studies, ordering and review of radiographic studies, pulse oximetry and re-evaluation of patient's condition.  Arvilla Meres, MD  8:59 AM

## 2020-09-04 NOTE — Plan of Care (Signed)
  Problem: Pain Managment: Goal: General experience of comfort will improve Outcome: Progressing   Problem: Safety: Goal: Ability to remain free from injury will improve Outcome: Progressing   Problem: Self-Care: Goal: Ability to communicate needs accurately will improve Outcome: Not Progressing

## 2020-09-04 NOTE — Progress Notes (Signed)
VAST RN X3 responded to Code [employer]. Pt with TL CVC. No additional access needed on arrival to patient's bedside.

## 2020-09-04 NOTE — Progress Notes (Signed)
  Echocardiogram 2D Echocardiogram has been performed.  Roosvelt Maser F 09/04/2020, 8:41 AM

## 2020-09-04 NOTE — Progress Notes (Signed)
Pharmacy Antibiotic Note  Erica Duncan is a 70 y.o. female admitted on 09/06/20 with heart failure exacerbation. No fevers, white count normal. Increased pressor requirements overnight. Pharmacy has been consulted for broad antibiotics with vancomycin and cefepime dosing. Will need to watch renal function closely with scr improving but elevated at 2.3.  Urine culture negative from 8/15, recheck blood cultures today.   With varied renal function will hold off on vancomycin AUC dosing. Will use tranditional nomogram for now.    Plan: Vancomycin 1500mg  now then 1g IV every 48 hours. Goal trough 15-20 mcg/mL. Cefepime 2g IV q 24 hours Check vancomycin trough as needed Follow renal function closely and narrow antibiotics as able  Height: 5' (152.4 cm) Weight: 67.3 kg (148 lb 5.9 oz) IBW/kg (Calculated) : 45.5  Temp (24hrs), Avg:98.2 F (36.8 C), Min:98 F (36.7 C), Max:98.4 F (36.9 C)  Recent Labs  Lab 09/01/20 1214 09/01/20 1501 09/01/20 1744 09/01/20 2309 09/02/20 0416 09/03/20 0303 09/03/20 1105 09/03/20 2245 09/04/20 0428  WBC  --   --   --   --  9.7 10.9* 9.7 9.1 8.5  CREATININE 2.81*  --   --  2.72* 2.75* 2.62*  --   --  2.31*  LATICACIDVEN 4.8* 2.7* 2.3*  --  1.5  --   --   --   --     Estimated Creatinine Clearance: 19.1 mL/min (A) (by C-G formula based on SCr of 2.31 mg/dL (H)).    No Known Allergies   Thank you for allowing pharmacy to be a part of this patient's care.  09/06/20 PharmD., BCPS Clinical Pharmacist 09/04/2020 12:33 PM

## 2020-09-04 NOTE — Progress Notes (Signed)
  Afternoon labs reviewed.   Remains on norepi 32 mcg + milrinone 0.125 mcg.   K 3.5--given additional 3 runs.  Hgb 9.3    No change for now.   Finnlee Silvernail NP_C  3:29 PM

## 2020-09-04 NOTE — Progress Notes (Signed)
IP rehab admissions - I met with patient, her daughter and her daughter's partner this am.  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  Dtr is interested in SNF placement at discharge.  Call me for questions.  #[phone] 

## 2020-09-04 NOTE — Progress Notes (Signed)
Physical Therapy Treatment Patient Details Name: Erica Duncan MRN: [id number] DOB: October 10, 1950 Today's Date: 09/04/2020    History of Present Illness 70yo female presenting to [facility] ED on 8/10 with acute-onset L weakness and confusion.  Pt was observed to have seizure like activity in ED.  MRI 8/11 with no acute findings.EEG negative for seizures. Hypotension and went into afib with RVR 8/14, PCCM and cardiology consulated transferred to ICU 8/15 for  cardiogenic shock. 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    PT Comments    Pt limited by pain and fatigue 2/2 to VT arrest earlier this morning. Emphasized bed mobility and transfers this session. Pt required decreased assist with sit to stand transfer compared to previous session. VSS throughout. Pt's family is interested in SNF upon d/c. Updated d/c recommendation and feel as though SNF is appropriate given pt's current impairments and decreased 24 hr support from family. As pt was limited by pain and fatigue, will reassess goals in upcoming session.    Follow Up Recommendations  SNF     Equipment Recommendations  Rolling walker with 5" wheels;3in1 (PT);Wheelchair (measurements PT);Wheelchair cushion (measurements PT)    Recommendations for Other Services       Precautions / Restrictions Precautions Precautions: Fall Precaution Comments: watch HR Restrictions Weight Bearing Restrictions: No    Mobility  Bed Mobility Overal bed mobility: Independent Bed Mobility: Rolling;Sidelying to Sit Rolling: Mod assist Sidelying to sit: HOB elevated;Min assist;+2 for physical assistance       General bed mobility comments: Mod A to roll to sidelying with assist from pads and bedrails. Sidelying to sit min A +2 to elevate trunk to achieve sitting position and facilitate hips anteriorly. Bed mobility limited by pain. Verbal cues  provided to push on UEs to sit up.    Transfers Overall transfer level: Needs assistance Equipment used: 2 person hand held assist Transfers: Sit to/from Stand Sit to Stand: Min assist;+2 physical assistance         General transfer comment: Performed sit to stand x2 with Min A +2 to power up into standing. Decreased timing and limited by pain and faitgue.  Ambulation/Gait             General Gait Details: Unable to assess   Stairs             Wheelchair Mobility    Modified Rankin (Stroke Patients Only)       Balance Overall balance assessment: Needs assistance Sitting-balance support: Feet unsupported;Bilateral upper extremity supported Sitting balance-Leahy Scale: Poor Sitting balance - Comments: Sitting balance limited by fatigue during session. Pt required external and BUE support Postural control: Left lateral lean                                  Cognition Arousal/Alertness: Awake/alert Behavior During Therapy: WFL for tasks assessed/performed Overall Cognitive Status: Within Functional Limits for tasks assessed                                 General Comments: Pt followed one step commands accurately      Exercises General Exercises - Upper Extremity Shoulder Extension: Strengthening;Both;10 reps;Supine;Other (comment) (with resistance) General Exercises - Lower Extremity Hip Flexion/Marching: Both;10 reps;Supine;AAROM Low Level/ICU Exercises Shoulder Press: 10 reps;Both;Supine;Other (  comment);AAROM Other Exercises Other Exercises: Knee flexion and extension with graded assist and resistance x10 bil in supine Other Exercises: Hip extension x10 bil with resistance supine    General Comments General comments (skin integrity, edema, etc.): Daughter present during session, HR 73-80 with spO2 in mid-90s      Pertinent Vitals/Pain Faces Pain Scale: Hurts little more Pain Location: chest and ribs Pain Descriptors /  Indicators: Discomfort;Grimacing;Moaning Pain Intervention(s): Monitored during session;Limited activity within patient's tolerance;Repositioned    Home Living                      Prior Function            PT Goals (current goals can now be found in the care plan section) Acute Rehab PT Goals Patient Stated Goal: per family to get better and go home PT Goal Formulation: With patient/family Time For Goal Achievement: 09/11/20 Potential to Achieve Goals: Good Progress towards PT goals: PT to reassess next treatment    Frequency    Min 3X/week      PT Plan Discharge plan needs to be updated    Co-evaluation              AM-PAC PT "6 Clicks" Mobility   Outcome Measure  Help needed turning from your back to your side while in a flat bed without using bedrails?: A Lot Help needed moving from lying on your back to sitting on the side of a flat bed without using bedrails?: A Lot Help needed moving to and from a bed to a chair (including a wheelchair)?: Total Help needed standing up from a chair using your arms (e.g., wheelchair or bedside chair)?: Total Help needed to walk in hospital room?: Total Help needed climbing 3-5 steps with a railing? : Total 6 Click Score: 8    End of Session   Activity Tolerance: Patient limited by fatigue;Patient limited by pain Patient left: in bed;with call bell/phone within reach;with family/visitor present;with bed alarm set Nurse Communication: Mobility status PT Visit Diagnosis: Other abnormalities of gait and mobility (R26.89);Muscle weakness (generalized) (M62.81)     Time: 6578-4696 PT Time Calculation (min) (ACUTE ONLY): 23 min  Charges:  $Therapeutic Exercise: 8-22 mins $Therapeutic Activity: 8-22 mins                     Erica Duncan, SPT Acute Rehab: [phone]      Vance Gather 09/04/2020, 6:09 PM

## 2020-09-04 NOTE — Progress Notes (Addendum)
  0730  VT arrest while sitting in the chair. Code Team responded.   Had brief compressions with shock x1 -->ROSC. Sats stable on nasal cannula   Remains on norepi+ milrinone.   K and Mag supplemented. Given 150 mg amio bolus and will continue on amio drip.   Make NPO and possibly swan later today. Daughter updated at bedside.    Amy Clegg NP-C  8:48 AM   Agree with above. Patient arrested after rounds with VT. I responded personally with HF team.   Had brief CPR with shock x 1. With abrupt ROSC.   K 3.1 still with frequent PVcs on monitor. No CP  Will supp K and Mg.  Check repeat echo.   Possible RHC later today.   Additional CCT 30 mins for Code time.   Arvilla Meres, MD  9:02 AM

## 2020-09-05 ENCOUNTER — Encounter (HOSPITAL_COMMUNITY): Admission: EM | Disposition: E | Payer: Self-pay | Source: Home / Self Care | Attending: Internal Medicine

## 2020-09-05 ENCOUNTER — Encounter (HOSPITAL_COMMUNITY): Payer: Self-pay | Admitting: Internal Medicine

## 2020-09-05 ENCOUNTER — Inpatient Hospital Stay (HOSPITAL_COMMUNITY): Payer: Medicare HMO

## 2020-09-05 DIAGNOSIS — G40909 Epilepsy, unspecified, not intractable, without status epilepticus: Secondary | ICD-10-CM | POA: Diagnosis not present

## 2020-09-05 DIAGNOSIS — I472 Ventricular tachycardia: Secondary | ICD-10-CM

## 2020-09-05 DIAGNOSIS — I5021 Acute systolic (congestive) heart failure: Secondary | ICD-10-CM

## 2020-09-05 DIAGNOSIS — I69398 Other sequelae of cerebral infarction: Secondary | ICD-10-CM | POA: Diagnosis not present

## 2020-09-05 HISTORY — PX: CENTRAL LINE INSERTION: CATH118232

## 2020-09-05 HISTORY — PX: IABP INSERTION: CATH118242

## 2020-09-05 HISTORY — PX: RIGHT/LEFT HEART CATH AND CORONARY ANGIOGRAPHY: CATH118266

## 2020-09-05 LAB — CBC
HCT: 29.7 % — ABNORMAL LOW (ref 36.0–46.0)
HCT: 26.2 % — ABNORMAL LOW (ref 36.0–46.0)
Hemoglobin: 9.7 g/dL — ABNORMAL LOW (ref 12.0–15.0)
Hemoglobin: 8.8 g/dL — ABNORMAL LOW (ref 12.0–15.0)
MCH: 22 pg — ABNORMAL LOW (ref 26.0–34.0)
MCH: 22.5 pg — ABNORMAL LOW (ref 26.0–34.0)
MCHC: 32.7 g/dL (ref 30.0–36.0)
MCHC: 33.6 g/dL (ref 30.0–36.0)
MCV: 67.5 fL — ABNORMAL LOW (ref 80.0–100.0)
MCV: 67 fL — ABNORMAL LOW (ref 80.0–100.0)
Platelets: 143 10*3/uL — ABNORMAL LOW (ref 150–400)
Platelets: 140 10*3/uL — ABNORMAL LOW (ref 150–400)
RBC: 4.4 MIL/uL (ref 3.87–5.11)
RBC: 3.91 MIL/uL (ref 3.87–5.11)
RDW: 16 % — ABNORMAL HIGH (ref 11.5–15.5)
RDW: 16.5 % — ABNORMAL HIGH (ref 11.5–15.5)
WBC: 10.8 10*3/uL — ABNORMAL HIGH (ref 4.0–10.5)
WBC: 13.4 10*3/uL — ABNORMAL HIGH (ref 4.0–10.5)
nRBC: 2.8 % — ABNORMAL HIGH (ref 0.0–0.2)
nRBC: 1.7 % — ABNORMAL HIGH (ref 0.0–0.2)

## 2020-09-05 LAB — MAGNESIUM: Magnesium: 2 mg/dL (ref 1.7–2.4)

## 2020-09-05 LAB — POCT I-STAT 7, (LYTES, BLD GAS, ICA,H+H)
Acid-base deficit: 4 mmol/L — ABNORMAL HIGH (ref 0.0–2.0)
Bicarbonate: 21 mmol/L (ref 20.0–28.0)
Calcium, Ion: 1.24 mmol/L (ref 1.15–1.40)
HCT: 33 % — ABNORMAL LOW (ref 36.0–46.0)
Hemoglobin: 11.2 g/dL — ABNORMAL LOW (ref 12.0–15.0)
O2 Saturation: 90 %
Potassium: 4.2 mmol/L (ref 3.5–5.1)
Sodium: 133 mmol/L — ABNORMAL LOW (ref 135–145)
TCO2: 22 mmol/L (ref 22–32)
pCO2 arterial: 36.6 mmHg (ref 32.0–48.0)
pH, Arterial: 7.366 (ref 7.350–7.450)
pO2, Arterial: 61 mmHg — ABNORMAL LOW (ref 83.0–108.0)

## 2020-09-05 LAB — POCT I-STAT EG7
Acid-base deficit: 2 mmol/L (ref 0.0–2.0)
Acid-base deficit: 7 mmol/L — ABNORMAL HIGH (ref 0.0–2.0)
Bicarbonate: 20.2 mmol/L (ref 20.0–28.0)
Bicarbonate: 24.6 mmol/L (ref 20.0–28.0)
Calcium, Ion: 1.05 mmol/L — ABNORMAL LOW (ref 1.15–1.40)
Calcium, Ion: 1.28 mmol/L (ref 1.15–1.40)
HCT: 33 % — ABNORMAL LOW (ref 36.0–46.0)
HCT: 34 % — ABNORMAL LOW (ref 36.0–46.0)
Hemoglobin: 11.2 g/dL — ABNORMAL LOW (ref 12.0–15.0)
Hemoglobin: 11.6 g/dL — ABNORMAL LOW (ref 12.0–15.0)
O2 Saturation: 32 %
O2 Saturation: 36 %
Potassium: 3.7 mmol/L (ref 3.5–5.1)
Potassium: 4.2 mmol/L (ref 3.5–5.1)
Sodium: 128 mmol/L — ABNORMAL LOW (ref 135–145)
Sodium: 133 mmol/L — ABNORMAL LOW (ref 135–145)
TCO2: 22 mmol/L (ref 22–32)
TCO2: 26 mmol/L (ref 22–32)
pCO2, Ven: 48 mmHg (ref 44.0–60.0)
pCO2, Ven: 48.9 mmHg (ref 44.0–60.0)
pH, Ven: 7.231 — ABNORMAL LOW (ref 7.250–7.430)
pH, Ven: 7.31 (ref 7.250–7.430)
pO2, Ven: 24 mmHg — CL (ref 32.0–45.0)
pO2, Ven: 24 mmHg — CL (ref 32.0–45.0)

## 2020-09-05 LAB — LACTIC ACID, PLASMA: Lactic Acid, Venous: 1.2 mmol/L (ref 0.5–1.9)

## 2020-09-05 LAB — COMPREHENSIVE METABOLIC PANEL
ALT: 293 U/L — ABNORMAL HIGH (ref 0–44)
AST: 106 U/L — ABNORMAL HIGH (ref 15–41)
Albumin: 2.3 g/dL — ABNORMAL LOW (ref 3.5–5.0)
Alkaline Phosphatase: 140 U/L — ABNORMAL HIGH (ref 38–126)
Anion gap: 11 (ref 5–15)
BUN: 48 mg/dL — ABNORMAL HIGH (ref 8–23)
CO2: 21 mmol/L — ABNORMAL LOW (ref 22–32)
Calcium: 8.8 mg/dL — ABNORMAL LOW (ref 8.9–10.3)
Chloride: 100 mmol/L (ref 98–111)
Creatinine, Ser: 2.15 mg/dL — ABNORMAL HIGH (ref 0.44–1.00)
GFR, Estimated: 24 mL/min — ABNORMAL LOW (ref >60)
Glucose, Bld: 109 mg/dL — ABNORMAL HIGH (ref 70–99)
Potassium: 3.7 mmol/L (ref 3.5–5.1)
Sodium: 132 mmol/L — ABNORMAL LOW (ref 135–145)
Total Bilirubin: 1.3 mg/dL — ABNORMAL HIGH (ref 0.3–1.2)
Total Protein: 6 g/dL — ABNORMAL LOW (ref 6.5–8.1)

## 2020-09-05 LAB — BASIC METABOLIC PANEL
Anion gap: 11 (ref 5–15)
BUN: 45 mg/dL — ABNORMAL HIGH (ref 8–23)
CO2: 21 mmol/L — ABNORMAL LOW (ref 22–32)
Calcium: 8.2 mg/dL — ABNORMAL LOW (ref 8.9–10.3)
Chloride: 96 mmol/L — ABNORMAL LOW (ref 98–111)
Creatinine, Ser: 2.29 mg/dL — ABNORMAL HIGH (ref 0.44–1.00)
GFR, Estimated: 22 mL/min — ABNORMAL LOW (ref >60)
Glucose, Bld: 129 mg/dL — ABNORMAL HIGH (ref 70–99)
Potassium: 3.5 mmol/L (ref 3.5–5.1)
Sodium: 128 mmol/L — ABNORMAL LOW (ref 135–145)

## 2020-09-05 LAB — COOXEMETRY PANEL
Carboxyhemoglobin: 1.2 % (ref 0.5–1.5)
Carboxyhemoglobin: 1.3 % (ref 0.5–1.5)
Methemoglobin: 1 % (ref 0.0–1.5)
Methemoglobin: 0.8 % (ref 0.0–1.5)
O2 Saturation: 53.9 %
O2 Saturation: 50.8 %
Total hemoglobin: 10.1 g/dL — ABNORMAL LOW (ref 12.0–16.0)
Total hemoglobin: 10.4 g/dL — ABNORMAL LOW (ref 12.0–16.0)

## 2020-09-05 LAB — GLUCOSE, CAPILLARY
Glucose-Capillary: 115 mg/dL — ABNORMAL HIGH (ref 70–99)
Glucose-Capillary: 119 mg/dL — ABNORMAL HIGH (ref 70–99)
Glucose-Capillary: 129 mg/dL — ABNORMAL HIGH (ref 70–99)
Glucose-Capillary: 143 mg/dL — ABNORMAL HIGH (ref 70–99)

## 2020-09-05 LAB — PROCALCITONIN: Procalcitonin: 1.73 ng/mL

## 2020-09-05 LAB — PROTIME-INR
INR: 1.3 — ABNORMAL HIGH (ref 0.8–1.2)
Prothrombin Time: 15.9 seconds — ABNORMAL HIGH (ref 11.4–15.2)

## 2020-09-05 LAB — HEPARIN LEVEL (UNFRACTIONATED): Heparin Unfractionated: 0.17 IU/mL — ABNORMAL LOW (ref 0.30–0.70)

## 2020-09-05 LAB — AMMONIA: Ammonia: 26 umol/L (ref 9–35)

## 2020-09-05 SURGERY — RIGHT/LEFT HEART CATH AND CORONARY ANGIOGRAPHY
Anesthesia: LOCAL

## 2020-09-05 MED ORDER — VASOPRESSIN 20 UNITS/100 ML INFUSION FOR SHOCK
0.0000 [IU]/min | INTRAVENOUS | Status: DC
  Administered 2020-09-05: 0.03 [IU]/min via INTRAVENOUS
  Administered 2020-09-06: 0.02 [IU]/min via INTRAVENOUS
  Administered 2020-09-07 – 2020-09-09 (×3): 0.01 [IU]/min via INTRAVENOUS
  Filled 2020-09-05 (×4): qty 100

## 2020-09-05 MED ORDER — SODIUM CHLORIDE 0.9 % IV SOLN: 250.0000 mL | INTRAVENOUS | Status: DC | PRN

## 2020-09-05 MED ORDER — SODIUM CHLORIDE 0.9% FLUSH: 3.0000 mL | INTRAVENOUS | Status: DC | PRN

## 2020-09-05 MED ORDER — HEPARIN SODIUM (PORCINE) 1000 UNIT/ML IJ SOLN
INTRAMUSCULAR | Status: DC | PRN
  Administered 2020-09-05: 4500 [IU] via INTRAVENOUS
  Administered 2020-09-05: 3000 [IU] via INTRAVENOUS

## 2020-09-05 MED ORDER — ACETAMINOPHEN 325 MG PO TABS: 650.0000 mg | ORAL_TABLET | ORAL | Status: DC | PRN

## 2020-09-05 MED ORDER — FUROSEMIDE 10 MG/ML IJ SOLN
INTRAMUSCULAR | Status: AC
  Filled 2020-09-05: qty 4

## 2020-09-05 MED ORDER — FUROSEMIDE 10 MG/ML IJ SOLN
40.0000 mg | Freq: Once | INTRAMUSCULAR | Status: AC
  Administered 2020-09-05: 40 mg via INTRAVENOUS
  Filled 2020-09-05: qty 4

## 2020-09-05 MED ORDER — SODIUM CHLORIDE 0.9 % IV SOLN: INTRAVENOUS | Status: DC

## 2020-09-05 MED ORDER — POTASSIUM CHLORIDE 10 MEQ/100ML IV SOLN
10.0000 meq | INTRAVENOUS | Status: DC
Start: 2020-09-05 — End: 2020-09-05

## 2020-09-05 MED ORDER — HYDRALAZINE HCL 20 MG/ML IJ SOLN: 10.0000 mg | INTRAMUSCULAR | Status: AC | PRN

## 2020-09-05 MED ORDER — VERAPAMIL HCL 2.5 MG/ML IV SOLN
INTRAVENOUS | Status: DC | PRN
  Administered 2020-09-05: 10 mL via INTRA_ARTERIAL

## 2020-09-05 MED ORDER — AMIODARONE HCL IN DEXTROSE 360-4.14 MG/200ML-% IV SOLN
INTRAVENOUS | Status: AC
  Filled 2020-09-05: qty 200

## 2020-09-05 MED ORDER — POTASSIUM CHLORIDE 10 MEQ/50ML IV SOLN
10.0000 meq | INTRAVENOUS | Status: AC
Start: 2020-09-05 — End: 2020-09-05
  Administered 2020-09-05: 10 meq via INTRAVENOUS

## 2020-09-05 MED ORDER — AMIODARONE HCL 150 MG/3ML IV SOLN
INTRAVENOUS | Status: AC
  Filled 2020-09-05: qty 3

## 2020-09-05 MED ORDER — POTASSIUM CHLORIDE 20 MEQ PO PACK: 40.0000 meq | PACK | Freq: Once | ORAL | Status: DC

## 2020-09-05 MED ORDER — POTASSIUM CHLORIDE 10 MEQ/50ML IV SOLN
10.0000 meq | INTRAVENOUS | Status: AC
  Administered 2020-09-05 (×3): 10 meq via INTRAVENOUS
  Filled 2020-09-05 (×3): qty 50

## 2020-09-05 MED ORDER — SODIUM CHLORIDE 0.9 % IV SOLN
6.2500 mg | Freq: Once | INTRAVENOUS | Status: AC
  Administered 2020-09-05: 6.25 mg via INTRAVENOUS
  Filled 2020-09-05: qty 0.25

## 2020-09-05 MED ORDER — HEPARIN (PORCINE) IN NACL 1000-0.9 UT/500ML-% IV SOLN
INTRAVENOUS | Status: AC
  Filled 2020-09-05: qty 1000

## 2020-09-05 MED ORDER — EPINEPHRINE HCL 5 MG/250ML IV SOLN IN NS
0.0000 ug/min | INTRAVENOUS | Status: DC
  Administered 2020-09-05: 1 ug/min via INTRAVENOUS
  Filled 2020-09-05: qty 250

## 2020-09-05 MED ORDER — SODIUM CHLORIDE 0.9% IV SOLUTION: INTRAVENOUS | Status: DC

## 2020-09-05 MED ORDER — HEPARIN SODIUM (PORCINE) 1000 UNIT/ML IJ SOLN
INTRAMUSCULAR | Status: AC
  Filled 2020-09-05: qty 1

## 2020-09-05 MED ORDER — FUROSEMIDE 10 MG/ML IJ SOLN
INTRAMUSCULAR | Status: DC | PRN
Start: 2020-09-05 — End: 2020-09-05
  Administered 2020-09-05: 80 mg via INTRAVENOUS

## 2020-09-05 MED ORDER — LABETALOL HCL 5 MG/ML IV SOLN: 10.0000 mg | INTRAVENOUS | Status: AC | PRN

## 2020-09-05 MED ORDER — HEPARIN (PORCINE) IN NACL 1000-0.9 UT/500ML-% IV SOLN
INTRAVENOUS | Status: DC | PRN
  Administered 2020-09-05 (×2): 500 mL

## 2020-09-05 MED ORDER — LIDOCAINE HCL (PF) 1 % IJ SOLN
INTRAMUSCULAR | Status: AC
  Filled 2020-09-05: qty 30

## 2020-09-05 MED ORDER — LIDOCAINE HCL (PF) 1 % IJ SOLN
INTRAMUSCULAR | Status: DC | PRN
  Administered 2020-09-05: 2 mL
  Administered 2020-09-05: 5 mL
  Administered 2020-09-05: 10 mL

## 2020-09-05 MED ORDER — IOHEXOL 350 MG/ML SOLN
INTRAVENOUS | Status: DC | PRN
  Administered 2020-09-05: 20 mL

## 2020-09-05 MED ORDER — SODIUM CHLORIDE 0.9% FLUSH
3.0000 mL | Freq: Two times a day (BID) | INTRAVENOUS | Status: DC
  Administered 2020-09-06 – 2020-09-09 (×5): 3 mL via INTRAVENOUS

## 2020-09-05 MED ORDER — SODIUM CHLORIDE 0.9% IV SOLUTION: INTRAVENOUS | Status: DC | PRN

## 2020-09-05 MED ORDER — LORAZEPAM 2 MG/ML IJ SOLN
1.0000 mg | Freq: Once | INTRAMUSCULAR | Status: AC
  Administered 2020-09-05: 1 mg via INTRAVENOUS
  Filled 2020-09-05: qty 1

## 2020-09-05 MED ORDER — MILRINONE LACTATE IN DEXTROSE 20-5 MG/100ML-% IV SOLN
0.3750 ug/kg/min | INTRAVENOUS | Status: DC
  Administered 2020-09-05 – 2020-09-09 (×8): 0.375 ug/kg/min via INTRAVENOUS
  Filled 2020-09-05 (×7): qty 100

## 2020-09-05 MED ORDER — HEPARIN (PORCINE) 25000 UT/250ML-% IV SOLN
1400.0000 [IU]/h | INTRAVENOUS | Status: DC
  Administered 2020-09-05: 700 [IU]/h via INTRAVENOUS
  Administered 2020-09-06: 1000 [IU]/h via INTRAVENOUS
  Filled 2020-09-05 (×3): qty 250

## 2020-09-05 MED ORDER — POTASSIUM CHLORIDE 10 MEQ/50ML IV SOLN
INTRAVENOUS | Status: AC
  Administered 2020-09-05: 10 meq via INTRAVENOUS
  Filled 2020-09-05: qty 100

## 2020-09-05 MED ORDER — VERAPAMIL HCL 2.5 MG/ML IV SOLN
INTRAVENOUS | Status: AC
  Filled 2020-09-05: qty 2

## 2020-09-05 MED ORDER — ONDANSETRON HCL 4 MG/2ML IJ SOLN: 4.0000 mg | Freq: Four times a day (QID) | INTRAMUSCULAR | Status: DC | PRN

## 2020-09-05 SURGICAL SUPPLY — 16 items
CATH IAB 7FR 34ML (CATHETERS) ×3 IMPLANT
CATH INFINITI 5FR MULTPACK ANG (CATHETERS) ×3 IMPLANT
CATH SWAN GANZ VIP 7.5F (CATHETERS) ×3 IMPLANT
DEVICE RAD COMP TR BAND LRG (VASCULAR PRODUCTS) ×3 IMPLANT
GLIDESHEATH SLEND SS 6F .021 (SHEATH) ×3 IMPLANT
GUIDEWIRE INQWIRE 1.5J.035X260 (WIRE) ×2 IMPLANT
INQWIRE 1.5J .035X260CM (WIRE) ×3
KIT MICROPUNCTURE NIT STIFF (SHEATH) ×3 IMPLANT
PACK CARDIAC CATHETERIZATION (CUSTOM PROCEDURE TRAY) ×3 IMPLANT
SHEATH PINNACLE 5F 10CM (SHEATH) ×3 IMPLANT
SHEATH PINNACLE 7F 10CM (SHEATH) ×3 IMPLANT
SHEATH PINNACLE 8F 10CM (SHEATH) ×3 IMPLANT
SHEATH PROBE COVER 6X72 (BAG) ×3 IMPLANT
SLEEVE REPOSITIONING LENGTH 30 (MISCELLANEOUS) ×3 IMPLANT
TRANSDUCER W/STOPCOCK (MISCELLANEOUS) ×3 IMPLANT
WIRE EMERALD 3MM-J .035X150CM (WIRE) ×3 IMPLANT

## 2020-09-05 NOTE — Progress Notes (Signed)
APP notified of pt pressure requirements has been increased since last 1 hr. MAP <65. Levo @43  mcg, Milrinone @0 .125 mcg, Amio @ 60 mg. Afib -100-120 with frequent PVC's. K-3.7, Cr-2.02.  see new order for K replacement and per Dr. order start Vasopressin and weaned off Levo as tolerated with goal MAP >60. Continue monitor patient

## 2020-09-05 NOTE — Progress Notes (Addendum)
ANTICOAGULATION CONSULT NOTE - Initial Consult  Pharmacy Consult for heparin  Indication: atrial fibrillation and IABP  No Known Allergies  Patient Measurements: Height: 5' (152.4 cm) Weight: 67.3 kg (148 lb 5.9 oz) IBW/kg (Calculated) : 45.5 Heparin Dosing Weight: TBW  Vital Signs: Temp: 97 F (36.1 C) (08/19 1230) Temp Source: Oral (08/19 0400) BP: 93/55 (08/19 0851) Pulse Rate: 104 (08/19 1230)  Labs: Recent Labs    09/03/20 0303 09/03/20 1105 09/04/20 0428 09/04/20 1343 08/30/2020 0202  HGB 11.0*   < > 9.4* 9.3* 9.7*  HCT 32.5*   < > 27.7* 28.3* 29.7*  PLT 160   < > 135* 128* 143*  LABPROT  --   --   --   --  15.9*  INR  --   --   --   --  1.3*  HEPARINUNFRC 0.73*  --  <0.10*  --   --   CREATININE 2.62*  --  2.31* 2.21* 2.15*   < > = values in this interval not displayed.    Estimated Creatinine Clearance: 20.5 mL/min (A) (by C-G formula based on SCr of 2.15 mg/dL (H)).   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 (HCC) 08/28/2020   Uncontrolled type 2 diabetes mellitus with hyperglycemia, with long-term current use of insulin (HCC) 08/28/2020    Medications:  Infusions:   sodium chloride     sodium chloride     sodium chloride 10 mL/hr at 08/31/2020 1200   sodium chloride     sodium chloride     sodium chloride     amiodarone 60 mg/hr (08/21/2020 1200)   ceFEPime (MAXIPIME) IV Stopped (09/04/20 1114)   levETIRAcetam Stopped (09/07/2020 0417)   milrinone 0.375 mcg/kg/min (09/11/2020 1200)   norepinephrine (LEVOPHED) Adult infusion 36 mcg/min (09/16/2020 1200)   sodium chloride     [START ON 09/06/2020] vancomycin      Assessment: 70 yo F in cardiogenic shock, now with IABP placed and a fib on  amio gtt.   IABP at 1:1.   Hemoglobin and platelets improved from yesterday. No signs of  bleeding per RN. TR band is almost off post cath.  Goal of Therapy:  Heparin level 0.3-0.5 units/ml (IABP with a fib) Monitor platelets by anticoagulation protocol: Yes   Plan:  No bolus Start heparin infusion at 700 units/hr Check anti-Xa level in 6 hours and daily while on heparin Continue to monitor H&H and platelets  Victorino Dike A Aaron Bostwick 08/19/2020,12:53 PM

## 2020-09-05 NOTE — Progress Notes (Signed)
OT Cancellation Note  Patient Details Name: Erica Duncan MRN: [id number] DOB: December 29, 1950   Cancelled Treatment:    Reason Eval/Treat Not Completed: Patient at procedure or test/ unavailable. Pt in heart cath procedure.  Ignacia Palma, OTR/L Acute Rehab Services Pager [phone] Office [phone]    Evette Georges 08/29/2020, 8:14 AM

## 2020-09-05 NOTE — TOC Initial Note (Signed)
Transition of Care (TOC) - Initial/Assessment Note  Heart Failure   Patient Details  Name: Erica Duncan MRN: [id number] Date of Birth: [birth year]  Transition of Care [facility]) CM/SW Contact:    Laporshia Hogen, LCSWA Phone Number: 09/02/2020, 11:22 AM  Clinical Narrative:                 CSW received consult for possible SNF placement at time of discharge. CSW spoke with the patient's daughter Helmut Muster at bedside. Patient's daughter reported that she is currently unable to care for patient at their home given patient's current physical needs and fall risk. Patient expressed understanding of PT recommendation and is agreeable to SNF placement at time of discharge. Patient reports preference for maybe [facility] or [facility]. CSW discussed insurance authorization process and provided Medicare SNF ratings list. Patient has/has not received the COVID vaccines. Patient expressed being hopeful for rehab and to feel better soon. No further questions reported at this time.   Skilled Nursing Rehab Facilities-   [email or web address] *Ratings updated quarterly   Ratings out of 5 possible   Name Address  Phone # Quality Care Staffing Health Inspection Overall  [facility] [address], [place] [phone] 5  4 4   [facility]  [address], [place] [phone] 4 2 4 4   [facility] [address] [place], [address] [place] 2 3 1 1   [facility] [address] 3 3 4 4   [facility] [address] [phone] 3  2 2   [facility] [address], [place] [phone] 2 2 4 4   [facility] [address] [place] 5 2 2 3   [facility] [address], [facility] [place] 4 2 2 2   [facility] [address] [facility] 5 2 2 3   [facility] [id number], [phone] 5 1 2 2    [facility] [address] [facility] [phone] 5 2 2 3   [facility]. [address] 3 1 1 1      Expected Discharge Plan: Skilled Nursing Facility Barriers to Discharge: Continued Medical Work up   Patient Goals and CMS Choice   CMS Medicare.gov Compare Post Acute Care list provided to:: Patient Represenative (must comment) (daughter Dianna) Choice offered to / list presented to : Adult Children  Expected Discharge Plan and Services Expected Discharge Plan: Skilled Nursing Facility In-house Referral: Clinical Social Work     Living arrangements for the past 2 months: Single Family Home                                      Prior Living Arrangements/Services Living arrangements for the past 2 months: Single Family Home Lives with:: Self, Adult Children, Relatives Patient language and need for interpreter reviewed:: Yes        Need for Family Participation in Patient Care: Yes (Comment) Care giver support system in place?: No (comment)   Criminal Activity/Legal Involvement Pertinent to Current Situation/Hospitalization: No - Comment as needed  Activities of Daily Living      Permission Sought/Granted                  Emotional Assessment Appearance:: Appears stated age Attitude/Demeanor/Rapport: Unresponsive Affect (typically observed): Unable to Assess, Quiet     Psych Involvement: No (comment)  Admission diagnosis:  Seizure (HCC) [R56.9] Focal seizures (HCC) [R56.9] Patient Active Problem List   Diagnosis Date Noted   Hypotension due to drugs    Atrial fibrillation with RVR (HCC)    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 [place] [address] Phone: [phone] Fax: [phone]     Social Determinants of Health (SDOH) Interventions Housing Interventions: Intervention Not Indicated  Readmission Risk Interventions No flowsheet data found.  Aalina Brege, MSW, LCSWA [phone] Heart Failure Social Worker

## 2020-09-05 NOTE — Progress Notes (Addendum)
Advanced Heart Failure Rounding Note   Subjective:   8/11 Admitted with abdominal pain -> developed shock, AKI and shock liver. EF 15%. Developed AF 8/15 AHF team consulted moved to IC. Started milrinone and NE 8/17:  Diuresed with IV lasix.  Hypotensive with increased pressor requirements with Norepi up to 55 mcg. Given 250 cc NS bolus. Milrinone cut back to 0.125 mcg. Imrproved with NE back down to 30 mcg. Hgb down 9.9. Sent for stat CT abd --negative for bleed. Heparin stopped.  8/17 VT arrest -> CPR & defib x 1  Had VT arrest yesterday in setting of K 3.4. Had brief CPR and defib x 1.  Lasix stopped. Overnight more SOB and BP dropped Now on NE 34 milrinone 0.125 and VP 0.03. Co-ox 54%  Developing metabolic acidosis on ABG  Repeat echo 8/18 EF 25-30%   Scr continues to improve. Down to 2.1  BCx NGTD.    Objective:   Weight Range:  Vital Signs:   Temp:  [97.7 F (36.5 C)-98.1 F (36.7 C)] 98.1 F (36.7 C) (08/19 0400) Pulse Rate:  [61-124] 117 (08/19 0700) Resp:  [12-25] 22 (08/19 0700) BP: (82-131)/(35-92) 102/56 (08/19 0700) SpO2:  [93 %-100 %] 93 % (08/19 0740) Arterial Line BP: (88-234)/(39-231) 88/53 (08/19 0700) Last BM Date: 09/02/20  Weight change: Filed Weights   09/02/20 0500 09/03/20 0500 09/04/20 0500  Weight: 65.9 kg 67 kg 67.3 kg    Intake/Output:   Intake/Output Summary (Last 24 hours) at 09/02/2020 0745 Last data filed at 08/29/2020 0402 Gross per 24 hour  Intake 2230.7 ml  Output 1650 ml  Net 580.7 ml     Physical Exam: General:  lying in bed weak. + cough HEENT: normal Neck: supple. Jvp to jaw . Carotids 2+ bilat; no bruits. No lymphadenopathy or thryomegaly appreciated. Cor: PMI nondisplaced. Regular tachy . No rubs, gallops or murmurs. Lungs: + crackles Abdomen: soft, mildly tender, nondistended. No hepatosplenomegaly. No bruits or masses. Good bowel sounds. Extremities: no cyanosis, clubbing, rash, tr edema Neuro: alert &  orientedx3, cranial nerves grossly intact. moves all 4 extremities w/o difficulty. Affect pleasant   Telemetry: Sinus tach 110-120 + PVCs Personally reviewed   Labs: Basic Metabolic Panel: Recent Labs  Lab 08/30/20 0433 08/31/20 0923 09/01/20 0337 09/01/20 0843 09/02/20 0416 09/03/20 0303 09/04/20 0428 09/04/20 1343 08/25/2020 0202  NA 137 139 136   < > 133* 133* 136 133* 132*  K 3.9 3.6 4.7   < > 3.4* 4.0 3.1* 3.5 3.7  CL 103 108 107   < > 102 102 98 100 100  CO2 22 19* 10*   < > 19* 22 26 25  21*  GLUCOSE 183* 119* 127*   < > 266* 235* 137* 172* 109*  BUN 29* 33* 47*   < > 59* 61* 56* 53* 48*  CREATININE 1.57* 1.81* 2.27*   < > 2.75* 2.62* 2.31* 2.21* 2.15*  CALCIUM 9.2 8.5* 8.8*   < > 8.3* 8.1* 8.8* 8.3* 8.8*  MG 1.8 1.8 1.8  --   --   --  1.5*  --  2.0   < > = values in this interval not displayed.     Liver Function Tests: Recent Labs  Lab 09/01/20 1214 09/02/20 0416 09/03/20 0303 09/04/20 0428 08/26/2020 0202  AST 1,418* 1,758* 632* 200* 106*  ALT 651* 857* 621* 363* 293*  ALKPHOS 191* 163* 157* 130* 140*  BILITOT 2.3* 1.6* 1.9* 1.6* 1.3*  PROT 5.5* 5.2*  5.4* 5.3* 6.0*  ALBUMIN 2.4* 2.2* 2.3* 2.1* 2.3*    No results for input(s): LIPASE, AMYLASE in the last 168 hours. Recent Labs  Lab 08/29/20 1220 09/01/20 1501 09/03/20 0313  AMMONIA 28 73* 28     CBC: Recent Labs  Lab 08/30/20 0433 09/01/20 0337 09/01/20 0843 09/03/20 1105 09/03/20 2245 09/04/20 0428 09/04/20 1343 08/28/2020 0202  WBC 8.2 9.6   < > 9.7 9.1 8.5 9.1 10.8*  NEUTROABS 6.3 8.0*  --   --   --   --   --   --   HGB 12.0 14.2   < > 9.9* 9.5* 9.4* 9.3* 9.7*  HCT 37.6 46.2*   < > 30.4* 28.4* 27.7* 28.3* 29.7*  MCV 70.9* 73.1*   < > 67.6* 66.4* 66.3* 67.4* 67.5*  PLT 132* 134*   < > 150 130* 135* 128* 143*   < > = values in this interval not displayed.     Cardiac Enzymes: No results for input(s): CKTOTAL, CKMB, CKMBINDEX, TROPONINI in the last 168 hours.  BNP: BNP (last 3  results) No results for input(s): BNP in the last 8760 hours.  ProBNP (last 3 results) No results for input(s): PROBNP in the last 8760 hours.    Other results:  Imaging: ECHOCARDIOGRAM LIMITED  Result Date: 09/04/2020    ECHOCARDIOGRAM LIMITED REPORT   Patient Name:   Erica Duncan [facility] Date of Exam: 09/04/2020 Medical Rec #:  [id number]                 Height:       60.0 in Accession #:    [id number]                Weight:       148.4 lb Date of Birth:  23-Apr-1950                 BSA:          1.644 m Patient Age:    70 years                  BP:           124/52 mmHg Patient Gender: F                         HR:           71 bpm. Exam Location:  Inpatient Procedure: Limited Echo and 2D Echo Indications:    CHF  History:        Patient has prior history of Echocardiogram examinations, most                 recent 09/01/2020. Stroke, Arrythmias:Atrial Fibrillation; Risk                 Factors:Hypertension, Diabetes and Dyslipidemia.  Sonographer:    Roosvelt Maser RDCS Referring Phys: [phone] AMY D CLEGG IMPRESSIONS  1. Left ventricular ejection fraction, by estimation, is 25 to 30%. The left ventricle has severely decreased function. The left ventricle demonstrates global hypokinesis. There is mild left ventricular hypertrophy.  2. Right ventricular systolic function is moderately reduced. The right ventricular size is normal.  3. A small pericardial effusion is present.  4. The mitral valve is abnormal. There is moderate thickening of the mitral valve leaflets. Restricted leaflet motion. Mitral regurgitation was not assessed  5. The aortic valve was not well visualized. Aortic valve regurgitation is not visualized.  6.  The inferior vena cava is normal in size with <50% respiratory variability, suggesting right atrial pressure of 8 mmHg. FINDINGS  Left Ventricle: Left ventricular ejection fraction, by estimation, is 25 to 30%. The left ventricle has severely decreased function. The left ventricle  demonstrates global hypokinesis. The left ventricular internal cavity size was normal in size. There is mild left ventricular hypertrophy. Right Ventricle: The right ventricular size is normal. Right ventricular systolic function is moderately reduced. Pericardium: A small pericardial effusion is present. Mitral Valve: The mitral valve is abnormal. There is moderate thickening of the mitral valve leaflet(s). Tricuspid Valve: The tricuspid valve is abnormal. Aortic Valve: The aortic valve was not well visualized. Aortic valve regurgitation is not visualized. Aorta: The aortic root and ascending aorta are structurally normal, with no evidence of dilitation. Venous: The inferior vena cava is normal in size with less than 50% respiratory variability, suggesting right atrial pressure of 8 mmHg. LEFT VENTRICLE PLAX 2D LVIDd:         5.00 cm LVIDs:         4.30 cm LV PW:         1.10 cm LV IVS:        0.80 cm LVOT diam:     1.70 cm  3D Volume EF: LVOT Area:     2.27 cm 3D EF:        35 %                         LV EDV:       159 ml                         LV ESV:       103 ml                         LV SV:        56 ml IVC IVC diam: 1.50 cm LEFT ATRIUM         Index LA diam:    3.20 cm 1.95 cm/m   AORTA Ao Root diam: 3.00 cm Ao Asc diam:  2.70 cm  SHUNTS Systemic Diam: 1.70 cm Epifanio Lesches MD Electronically signed by Epifanio Lesches MD Signature Date/Time: 09/04/2020/11:10:42 AM    Final    CT CHEST ABDOMEN PELVIS WO CONTRAST  Result Date: 09/04/2020 CLINICAL DATA:  Evaluate for GI bleed. EXAM: CT CHEST, ABDOMEN AND PELVIS WITHOUT CONTRAST TECHNIQUE: Multidetector CT imaging of the chest, abdomen and pelvis was performed following the standard protocol without IV contrast. COMPARISON:  None. FINDINGS: CT CHEST FINDINGS Cardiovascular: Cardiac enlargement. Aortic atherosclerosis. Coronary artery calcifications. Mediastinum/Nodes: No enlarged mediastinal, hilar, or axillary lymph nodes. Thyroid gland,  trachea, and esophagus demonstrate no significant findings. Lungs/Pleura: Small to moderate bilateral pleural effusions. Airspace consolidation within the left lower lobe is noted. Subsegmental atelectasis overlies the right pleural effusion. Diffuse patchy airspace and ground-glass densities are identified throughout both lungs. Musculoskeletal: No chest wall mass or suspicious bone lesions identified. CT ABDOMEN PELVIS FINDINGS Hepatobiliary: No focal liver abnormality is seen. No gallstones, gallbladder wall thickening, or biliary dilatation. Pancreas: Unremarkable. No pancreatic ductal dilatation or surrounding inflammatory changes. Spleen: Normal in size without focal abnormality. Adrenals/Urinary Tract: Adrenal glands are unremarkable. Kidneys are normal, without renal calculi, focal lesion, or hydronephrosis. Bladder is unremarkable. Stomach/Bowel: The stomach appears normal. The appendix is visualized and appears normal. There is no  bowel wall thickening, inflammation or distension. Extensive distal colonic diverticulosis. No signs of acute diverticulitis. Unenhanced CT is of limited value in assessing for GI bleed. Vascular/Lymphatic: Aortic atherosclerosis. No enlarged abdominal or pelvic lymph nodes. Reproductive: Uterus and bilateral adnexa are unremarkable. Other: There is a small volume of free fluid identified along the peritoneal reflections, pericolic gutters and posterior pelvis. No signs of retroperitoneal hematoma. No significant hemoperitoneum identified. Musculoskeletal: No acute or significant osseous findings. Diffuse body wall edema is identified compatible with anasarca. IMPRESSION: 1. Moderate bilateral pleural effusions airspace consolidation in the left lower lobe and atelectasis in right lower lobe. 2. Diffuse patchy airspace and ground-glass densities are identified throughout both lungs. Findings may reflect multifocal infection versus pulmonary edema. 3. Aortic atherosclerosis and  coronary artery calcifications. 4. Small volume of free fluid identified within the abdomen and pelvis. Additionally, there is marked diffuse body wall edema. Findings compatible with anasarca secondary to fluid overload state. 5. Unenhanced CT of the abdomen pelvis is of limited utility in assessing for GI bleed. If there is active GI bleeding, and further imaging is clinically indicated, then CTA of the abdomen and pelvis without and with contrast material or nuclear medicine tagged red blood cell scan would be advised. Aortic Atherosclerosis (ICD10-I70.0). Electronically Signed   By: Signa Kell M.D.   On: 09/04/2020 08:42     Medications:     Scheduled Medications:  [MAR Hold] aspirin EC  81 mg Oral Daily   [MAR Hold] Chlorhexidine Gluconate Cloth  6 each Topical Daily   [MAR Hold] enoxaparin (LOVENOX) injection  30 mg Subcutaneous Q24H   [MAR Hold] insulin aspart  0-15 Units Subcutaneous TID WC   [MAR Hold] insulin aspart  0-5 Units Subcutaneous QHS   [MAR Hold] insulin aspart  4 Units Subcutaneous TID WC   [MAR Hold] insulin glargine-yfgn  20 Units Subcutaneous QHS   [MAR Hold] sodium chloride flush  10-40 mL Intracatheter Q12H   [MAR Hold] sodium chloride flush  3 mL Intravenous Once   [MAR Hold] sodium chloride flush  3 mL Intravenous Q12H    Infusions:  [MAR Hold] sodium chloride     [MAR Hold] sodium chloride Stopped (09/24/2020 0314)   sodium chloride     sodium chloride     amiodarone 60 mg/hr (09/24/20 0402)   [MAR Hold] ceFEPime (MAXIPIME) IV Stopped (09/04/20 1114)   [MAR Hold] levETIRAcetam 250 mg (Sep 24, 2020 0402)   milrinone 0.125 mcg/kg/min (Sep 24, 2020 0402)   [MAR Hold] norepinephrine (LEVOPHED) Adult infusion 34 mcg/min (09-24-2020 0402)   [MAR Hold] sodium chloride     [MAR Hold] vancomycin     vasopressin 0.03 Units/min (2020/09/24 0402)    PRN Medications: Place/Maintain arterial line **AND** [MAR Hold] sodium chloride, [MAR Hold] sodium chloride, sodium chloride,  [MAR Hold] LORazepam, [MAR Hold] ondansetron (ZOFRAN) IV, [MAR Hold] polyethylene glycol, [MAR Hold] sodium chloride, [MAR Hold] sodium chloride flush, sodium chloride flush, [MAR Hold] traMADol   Assessment/Plan :   1. Acute systolic HF -> cardiogenic shock -New diagnosis this admit -EF ~ 20-25%, MR, TR on echo - Repeat echo 9/18 25-30% - Initial lactate 4.8 -> 1.5. Co-ox 41% on 8/15 - Overnight increased pressor requirement.  - Co-ox  53%%. NE  34, milrinone 0.125 mcg and VP 0.03 - Volume status back up today. ABG with developing metabolic acidosis - She is declining. Wil take to cath lab today for [facility], coronary angio (minimal dye) and possible mechanical support - No GDMT yet due to shock -  Age prohibits transplant. If doesn't begin to stabilize will need to consider whether she is candidate for [place] 5.5 bridge to recovery or possible durable VAD  2. VT arrest on 8/17 - in setting of hypokalemia - Continue amio  - Keep K > 4.0 Mg > 2.0  3. Atrial fibrillation with RVR: -Was in NSR on admit. Noted 08/14. In/out AF on 8/15 - In SR with frequent NSVT. Continue IV amio.  - Off Heparin drip with drop in hgb.  - No obvious source of bleeding.  - AB CT negative for RPB on 8/18  4. AKI on CKD, stage IIIb - Likely due to ATN in setting of #1 - Cr 1.74 > 2.27 -> 2.7 -> 2.7 -> 2.6->2.3-> 2,1 - Improving with hemodynamic support   5. Shock liver -Stabilizing with hemodynamic support - Stop lactulose.    6. Insulin dependent DM II -SSI -Semglee -Alc 7.9   7. Hypokalemia - K 3.7. will supp   8. Hx CVA/seizure activity -Followed by Neurology -On Keppra -No acute infarct on MRI brain and CT head  9. Microcytic anemia, iron-deficiens - iron sats low - given IV Iron 8/17 -Off heparin drip  -  hgb d12>11>9.9>9.4 -> 9.7 - CT abd negative for bleed.  - No obvious source of bleeding.   CRITICAL CARE Performed by: Arvilla MeresBensimhon, Jadyn Barge  Total critical care time: 45  minutes  Critical care time was exclusive of separately billable procedures and treating other patients.  Critical care was necessary to treat or prevent imminent or life-threatening deterioration.  Critical care was time spent personally by me (independent of midlevel providers or residents) on the following activities: development of treatment plan with patient and/or surrogate as well as nursing, discussions with consultants, evaluation of patient's response to treatment, examination of patient, obtaining history from patient or surrogate, ordering and performing treatments and interventions, ordering and review of laboratory studies, ordering and review of radiographic studies, pulse oximetry and re-evaluation of patient's condition.  Length of Stay: 8   Arvilla Meresaniel Ladarrell Cornwall  MD 09/15/2020, 7:45 AM  Advanced Heart Failure Team Pager [phone] (M-F; 7a - 4p)  Please contact [facility] for night-coverage after hours (4p -7a ) and weekends on [email or web address]

## 2020-09-05 NOTE — H&P (View-Only) (Signed)
Advanced Heart Failure Rounding Note   Subjective:   8/11 Admitted with abdominal pain -> developed shock, AKI and shock liver. EF 15%. Developed AF 8/15 AHF team consulted moved to IC. Started milrinone and NE 8/17:  Diuresed with IV lasix.  Hypotensive with increased pressor requirements with Norepi up to 55 mcg. Given 250 cc NS bolus. Milrinone cut back to 0.125 mcg. Imrproved with NE back down to 30 mcg. Hgb down 9.9. Sent for stat CT abd --negative for bleed. Heparin stopped.  8/17 VT arrest -> CPR & defib x 1  Had VT arrest yesterday in setting of K 3.4. Had brief CPR and defib x 1.  Lasix stopped. Overnight more SOB and BP dropped Now on NE 34 milrinone 0.125 and VP 0.03. Co-ox 54%  Repeat echo 8/18 EF 25-30%   Scr continues to improve. Down to 2.1  BCx NGTD.    Objective:   Weight Range:  Vital Signs:   Temp:  [97.7 F (36.5 C)-98.1 F (36.7 C)] 98.1 F (36.7 C) (08/19 0400) Pulse Rate:  [61-124] 117 (08/19 0700) Resp:  [12-25] 22 (08/19 0700) BP: (82-131)/(35-92) 102/56 (08/19 0700) SpO2:  [93 %-100 %] 93 % (08/19 0740) Arterial Line BP: (88-234)/(39-231) 88/53 (08/19 0700) Last BM Date: 09/02/20  Weight change: Filed Weights   09/02/20 0500 09/03/20 0500 09/04/20 0500  Weight: 65.9 kg 67 kg 67.3 kg    Intake/Output:   Intake/Output Summary (Last 24 hours) at 08/26/2020 0745 Last data filed at 08/19/2020 0402 Gross per 24 hour  Intake 2230.7 ml  Output 1650 ml  Net 580.7 ml     Physical Exam: General:  lying in bed weak. + cough HEENT: normal Neck: supple. Jvp to jaw . Carotids 2+ bilat; no bruits. No lymphadenopathy or thryomegaly appreciated. Cor: PMI nondisplaced. Regular tachy . No rubs, gallops or murmurs. Lungs: + crackles Abdomen: soft, mildly tender, nondistended. No hepatosplenomegaly. No bruits or masses. Good bowel sounds. Extremities: no cyanosis, clubbing, rash, tr edema Neuro: alert & orientedx3, cranial nerves grossly intact.  moves all 4 extremities w/o difficulty. Affect pleasant   Telemetry: Sinus tach 110-120 + PVCs Personally reviewed   Labs: Basic Metabolic Panel: Recent Labs  Lab 08/30/20 0433 08/31/20 0923 09/01/20 0337 09/01/20 0843 09/02/20 0416 09/03/20 0303 09/04/20 0428 09/04/20 1343 09/14/2020 0202  NA 137 139 136   < > 133* 133* 136 133* 132*  K 3.9 3.6 4.7   < > 3.4* 4.0 3.1* 3.5 3.7  CL 103 108 107   < > 102 102 98 100 100  CO2 22 19* 10*   < > 19* 22 26 25  21*  GLUCOSE 183* 119* 127*   < > 266* 235* 137* 172* 109*  BUN 29* 33* 47*   < > 59* 61* 56* 53* 48*  CREATININE 1.57* 1.81* 2.27*   < > 2.75* 2.62* 2.31* 2.21* 2.15*  CALCIUM 9.2 8.5* 8.8*   < > 8.3* 8.1* 8.8* 8.3* 8.8*  MG 1.8 1.8 1.8  --   --   --  1.5*  --  2.0   < > = values in this interval not displayed.     Liver Function Tests: Recent Labs  Lab 09/01/20 1214 09/02/20 0416 09/03/20 0303 09/04/20 0428 09/12/2020 0202  AST 1,418* 1,758* 632* 200* 106*  ALT 651* 857* 621* 363* 293*  ALKPHOS 191* 163* 157* 130* 140*  BILITOT 2.3* 1.6* 1.9* 1.6* 1.3*  PROT 5.5* 5.2* 5.4* 5.3* 6.0*  ALBUMIN 2.4*  2.2* 2.3* 2.1* 2.3*    No results for input(s): LIPASE, AMYLASE in the last 168 hours. Recent Labs  Lab 08/29/20 1220 09/01/20 1501 09/03/20 0313  AMMONIA 28 73* 28     CBC: Recent Labs  Lab 08/30/20 0433 09/01/20 0337 09/01/20 0843 09/03/20 1105 09/03/20 2245 09/04/20 0428 09/04/20 1343 09/04/2020 0202  WBC 8.2 9.6   < > 9.7 9.1 8.5 9.1 10.8*  NEUTROABS 6.3 8.0*  --   --   --   --   --   --   HGB 12.0 14.2   < > 9.9* 9.5* 9.4* 9.3* 9.7*  HCT 37.6 46.2*   < > 30.4* 28.4* 27.7* 28.3* 29.7*  MCV 70.9* 73.1*   < > 67.6* 66.4* 66.3* 67.4* 67.5*  PLT 132* 134*   < > 150 130* 135* 128* 143*   < > = values in this interval not displayed.     Cardiac Enzymes: No results for input(s): CKTOTAL, CKMB, CKMBINDEX, TROPONINI in the last 168 hours.  BNP: BNP (last 3 results) No results for input(s): BNP in the  last 8760 hours.  ProBNP (last 3 results) No results for input(s): PROBNP in the last 8760 hours.    Other results:  Imaging: ECHOCARDIOGRAM LIMITED  Result Date: 09/04/2020    ECHOCARDIOGRAM LIMITED REPORT   Patient Name:   Erica Duncan [facility] Date of Exam: 09/04/2020 Medical Rec #:  [id number]                 Height:       60.0 in Accession #:    [id number]                Weight:       148.4 lb Date of Birth:  March 19, 1950                 BSA:          1.644 m Patient Age:    70 years                  BP:           124/52 mmHg Patient Gender: F                         HR:           71 bpm. Exam Location:  Inpatient Procedure: Limited Echo and 2D Echo Indications:    CHF  History:        Patient has prior history of Echocardiogram examinations, most                 recent 09/01/2020. Stroke, Arrythmias:Atrial Fibrillation; Risk                 Factors:Hypertension, Diabetes and Dyslipidemia.  Sonographer:    Roosvelt Maser RDCS Referring Phys: [phone] AMY D CLEGG IMPRESSIONS  1. Left ventricular ejection fraction, by estimation, is 25 to 30%. The left ventricle has severely decreased function. The left ventricle demonstrates global hypokinesis. There is mild left ventricular hypertrophy.  2. Right ventricular systolic function is moderately reduced. The right ventricular size is normal.  3. A small pericardial effusion is present.  4. The mitral valve is abnormal. There is moderate thickening of the mitral valve leaflets. Restricted leaflet motion. Mitral regurgitation was not assessed  5. The aortic valve was not well visualized. Aortic valve regurgitation is not visualized.  6. The inferior vena cava is normal  in size with <50% respiratory variability, suggesting right atrial pressure of 8 mmHg. FINDINGS  Left Ventricle: Left ventricular ejection fraction, by estimation, is 25 to 30%. The left ventricle has severely decreased function. The left ventricle demonstrates global hypokinesis. The left  ventricular internal cavity size was normal in size. There is mild left ventricular hypertrophy. Right Ventricle: The right ventricular size is normal. Right ventricular systolic function is moderately reduced. Pericardium: A small pericardial effusion is present. Mitral Valve: The mitral valve is abnormal. There is moderate thickening of the mitral valve leaflet(s). Tricuspid Valve: The tricuspid valve is abnormal. Aortic Valve: The aortic valve was not well visualized. Aortic valve regurgitation is not visualized. Aorta: The aortic root and ascending aorta are structurally normal, with no evidence of dilitation. Venous: The inferior vena cava is normal in size with less than 50% respiratory variability, suggesting right atrial pressure of 8 mmHg. LEFT VENTRICLE PLAX 2D LVIDd:         5.00 cm LVIDs:         4.30 cm LV PW:         1.10 cm LV IVS:        0.80 cm LVOT diam:     1.70 cm  3D Volume EF: LVOT Area:     2.27 cm 3D EF:        35 %                         LV EDV:       159 ml                         LV ESV:       103 ml                         LV SV:        56 ml IVC IVC diam: 1.50 cm LEFT ATRIUM         Index LA diam:    3.20 cm 1.95 cm/m   AORTA Ao Root diam: 3.00 cm Ao Asc diam:  2.70 cm  SHUNTS Systemic Diam: 1.70 cm Epifanio Lescheshristopher Schumann MD Electronically signed by Epifanio Lescheshristopher Schumann MD Signature Date/Time: 09/04/2020/11:10:42 AM    Final    CT CHEST ABDOMEN PELVIS WO CONTRAST  Result Date: 09/04/2020 CLINICAL DATA:  Evaluate for GI bleed. EXAM: CT CHEST, ABDOMEN AND PELVIS WITHOUT CONTRAST TECHNIQUE: Multidetector CT imaging of the chest, abdomen and pelvis was performed following the standard protocol without IV contrast. COMPARISON:  None. FINDINGS: CT CHEST FINDINGS Cardiovascular: Cardiac enlargement. Aortic atherosclerosis. Coronary artery calcifications. Mediastinum/Nodes: No enlarged mediastinal, hilar, or axillary lymph nodes. Thyroid gland, trachea, and esophagus demonstrate no  significant findings. Lungs/Pleura: Small to moderate bilateral pleural effusions. Airspace consolidation within the left lower lobe is noted. Subsegmental atelectasis overlies the right pleural effusion. Diffuse patchy airspace and ground-glass densities are identified throughout both lungs. Musculoskeletal: No chest wall mass or suspicious bone lesions identified. CT ABDOMEN PELVIS FINDINGS Hepatobiliary: No focal liver abnormality is seen. No gallstones, gallbladder wall thickening, or biliary dilatation. Pancreas: Unremarkable. No pancreatic ductal dilatation or surrounding inflammatory changes. Spleen: Normal in size without focal abnormality. Adrenals/Urinary Tract: Adrenal glands are unremarkable. Kidneys are normal, without renal calculi, focal lesion, or hydronephrosis. Bladder is unremarkable. Stomach/Bowel: The stomach appears normal. The appendix is visualized and appears normal. There is no bowel wall thickening, inflammation or distension.  Extensive distal colonic diverticulosis. No signs of acute diverticulitis. Unenhanced CT is of limited value in assessing for GI bleed. Vascular/Lymphatic: Aortic atherosclerosis. No enlarged abdominal or pelvic lymph nodes. Reproductive: Uterus and bilateral adnexa are unremarkable. Other: There is a small volume of free fluid identified along the peritoneal reflections, pericolic gutters and posterior pelvis. No signs of retroperitoneal hematoma. No significant hemoperitoneum identified. Musculoskeletal: No acute or significant osseous findings. Diffuse body wall edema is identified compatible with anasarca. IMPRESSION: 1. Moderate bilateral pleural effusions airspace consolidation in the left lower lobe and atelectasis in right lower lobe. 2. Diffuse patchy airspace and ground-glass densities are identified throughout both lungs. Findings may reflect multifocal infection versus pulmonary edema. 3. Aortic atherosclerosis and coronary artery calcifications. 4. Small  volume of free fluid identified within the abdomen and pelvis. Additionally, there is marked diffuse body wall edema. Findings compatible with anasarca secondary to fluid overload state. 5. Unenhanced CT of the abdomen pelvis is of limited utility in assessing for GI bleed. If there is active GI bleeding, and further imaging is clinically indicated, then CTA of the abdomen and pelvis without and with contrast material or nuclear medicine tagged red blood cell scan would be advised. Aortic Atherosclerosis (ICD10-I70.0). Electronically Signed   By: Signa Kell M.D.   On: 09/04/2020 08:42     Medications:     Scheduled Medications:  [MAR Hold] aspirin EC  81 mg Oral Daily   [MAR Hold] Chlorhexidine Gluconate Cloth  6 each Topical Daily   [MAR Hold] enoxaparin (LOVENOX) injection  30 mg Subcutaneous Q24H   [MAR Hold] insulin aspart  0-15 Units Subcutaneous TID WC   [MAR Hold] insulin aspart  0-5 Units Subcutaneous QHS   [MAR Hold] insulin aspart  4 Units Subcutaneous TID WC   [MAR Hold] insulin glargine-yfgn  20 Units Subcutaneous QHS   [MAR Hold] sodium chloride flush  10-40 mL Intracatheter Q12H   [MAR Hold] sodium chloride flush  3 mL Intravenous Once   [MAR Hold] sodium chloride flush  3 mL Intravenous Q12H    Infusions:  [MAR Hold] sodium chloride     [MAR Hold] sodium chloride Stopped (09/06/2020 0314)   sodium chloride     sodium chloride     amiodarone 60 mg/hr (09/04/2020 0402)   [MAR Hold] ceFEPime (MAXIPIME) IV Stopped (09/04/20 1114)   [MAR Hold] levETIRAcetam 250 mg (08/21/2020 0402)   milrinone 0.125 mcg/kg/min (09/14/2020 0402)   [MAR Hold] norepinephrine (LEVOPHED) Adult infusion 34 mcg/min (08/24/2020 0402)   [MAR Hold] sodium chloride     [MAR Hold] vancomycin     vasopressin 0.03 Units/min (08/26/2020 0402)    PRN Medications: Place/Maintain arterial line **AND** [MAR Hold] sodium chloride, [MAR Hold] sodium chloride, sodium chloride, [MAR Hold] LORazepam, [MAR Hold]  ondansetron (ZOFRAN) IV, [MAR Hold] polyethylene glycol, [MAR Hold] sodium chloride, [MAR Hold] sodium chloride flush, sodium chloride flush, [MAR Hold] traMADol   Assessment/Plan :   1. Acute systolic HF -> cardiogenic shock -New diagnosis this admit -EF ~ 20-25%, MR, TR on echo - Repeat echo 9/18 25-30% - Initial lactate 4.8 -> 1.5. Co-ox 41% on 8/15 - Overnight increased pressor requirement.  - Co-ox  53%%. NE  34, milrinone 0.125 mcg and VP 0.03 - Acidosis resolved. End-organ function improving  but remains very tenuous - Volume status back up today.  - She is declining. Wil take to cath lab today for [facility], coronary angio (minimal dye) and possible mechanical support - No GDMT yet due to  shock  2. VT arrest on 8/17 - in setting of hypokalemia - Continue amio  - Keep K > 4.0 Mg > 2.0  3. Atrial fibrillation with RVR: -Was in NSR on admit. Noted 08/14. In/out AF on 8/15 - In SR with frequent NSVT. Continue IV amio.  - Off Heparin drip with drop in hgb.  - No obvious source of bleeding.  - AB CT negative for RPB on 8/18  4. AKI on CKD, stage IIIb - Likely due to ATN in setting of #1 - Cr 1.74 > 2.27 -> 2.7 -> 2.7 -> 2.6->2.3-> 2,1 - Improving with hemodynamic support   5. Shock liver -Stabilizing with hemodynamic support - Stop lactulose.    6. Insulin dependent DM II -SSI -Semglee -Alc 7.9   7. Hypokalemia - K 3.7. will supp   8. Hx CVA/seizure activity -Followed by Neurology -On Keppra -No acute infarct on MRI brain and CT head  9. Microcytic anemia, iron-deficiens - iron sats low - given IV Iron 8/17 -Off heparin drip  -  hgb d12>11>9.9>9.4 -> 9.7 - CT abd negative for bleed.  - No obvious source of bleeding.   CRITICAL CARE Performed by: Arvilla Meres  Total critical care time: 45 minutes  Critical care time was exclusive of separately billable procedures and treating other patients.  Critical care was necessary to treat or prevent imminent  or life-threatening deterioration.  Critical care was time spent personally by me (independent of midlevel providers or residents) on the following activities: development of treatment plan with patient and/or surrogate as well as nursing, discussions with consultants, evaluation of patient's response to treatment, examination of patient, obtaining history from patient or surrogate, ordering and performing treatments and interventions, ordering and review of laboratory studies, ordering and review of radiographic studies, pulse oximetry and re-evaluation of patient's condition.  Length of Stay: 8   Arvilla Meres  MD 08/30/2020, 7:45 AM  Advanced Heart Failure Team Pager [phone] (M-F; 7a - 4p)  Please contact [facility] for night-coverage after hours (4p -7a ) and weekends on [email or web address]

## 2020-09-05 NOTE — Progress Notes (Signed)
APP Dr Joyce Gross notified of pt having Nausea & Vomitingx2. PRN Zofran given., Zofran is not helping to relieve N/V . See Mar for new order

## 2020-09-05 NOTE — Progress Notes (Signed)
Placed pt on BIPAP per MD order. No noted respiratory issues at this time.

## 2020-09-05 NOTE — Progress Notes (Signed)
Orthopedic Tech Progress Note Patient Details:  Erica Duncan 1950/07/04 [id number]  Ortho Devices Type of Ortho Device: Knee Immobilizer Ortho Device/Splint Location: RLE Ortho Device/Splint Interventions: Application, Ordered   Post Interventions Patient Tolerated: Well  Omaree Fuqua A Williamson Cavanah 09/04/2020, 12:07 PM

## 2020-09-05 NOTE — Progress Notes (Signed)
Advanced HF PM Rounds   Patient remains on IABP, NE and milrinone.  She has diuresed well and earlier in the afternoon, I took her off Bipap. Remains very weak.   Serial swan numbers done throughout the afternoon. SVR now down to 1000. NE maxed at 40.   CI ~ 2.2.   Remains on abx for possible septic component. PCT back at 1.7  Has been in/out Sinus/AFL.  Will restart vasopressin. Hold lasix for now. Continue abx.   I suspect we will need to make a decision soon as to whether we should proceed with Impella 5.5 as bridge to longer recovery or durable LVAD. Given her current condition she is borderline candidate.   Will check: Lactic acid, co-ox, BMET, CBC and repeat ammonia.   Additional CCT throughout the afternoon/evening 70 mins   Arvilla Meres, MD  9:02 PM

## 2020-09-05 NOTE — Interval H&P Note (Signed)
History and Physical Interval Note:  03-Oct-2020 7:55 AM  Erica Duncan  has presented today for surgery, with the diagnosis of heart failure.  The various methods of treatment have been discussed with the patient and family. After consideration of risks, benefits and other options for treatment, the patient has consented to  Procedure(s): RIGHT HEART CATH (N/A) left heart cath, coronary angiography, possible coronary angioplasty and/or mechanical support device as a surgical intervention.  The patient's history has been reviewed, patient examined, no change in status, stable for surgery.  I have reviewed the patient's chart and labs.  Questions were answered to the patient's satisfaction.     Felesia Stahlecker

## 2020-09-05 NOTE — Progress Notes (Signed)
Inpatient Rehab Admissions Coordinator:    Pt. Does not have 24/7 support at d/c and family requests SNF placement. CIR to sign off.   Megan Salon, MS, CCC-SLP Rehab Admissions Coordinator  [phone] (celll) [phone] (office)

## 2020-09-05 NOTE — Progress Notes (Signed)
ANTICOAGULATION CONSULT NOTE   Pharmacy Consult for heparin  Indication: atrial fibrillation and IABP  No Known Allergies  Patient Measurements: Height: 5' (152.4 cm) Weight: 67.3 kg (148 lb 5.9 oz) IBW/kg (Calculated) : 45.5 Heparin Dosing Weight: TBW  Vital Signs: Temp: 98.8 F (37.1 C) (08/19 2100) Temp Source: Core (08/19 2000) BP: 100/33 (08/19 2000) Pulse Rate: 62 (08/19 2100)  Labs: Recent Labs    09/03/20 0303 09/03/20 1105 09/04/20 0428 09/04/20 1343 09/01/2020 0202 09/08/2020 0815 08/26/2020 0826 08/19/2020 1948  HGB 11.0*   < > 9.4* 9.3* 9.7* 11.6*  11.2* 11.2*  --   HCT 32.5*   < > 27.7* 28.3* 29.7* 34.0*  33.0* 33.0*  --   PLT 160   < > 135* 128* 143*  --   --   --   LABPROT  --   --   --   --  15.9*  --   --   --   INR  --   --   --   --  1.3*  --   --   --   HEPARINUNFRC 0.73*  --  <0.10*  --   --   --   --  0.17*  CREATININE 2.62*  --  2.31* 2.21* 2.15*  --   --  2.29*   < > = values in this interval not displayed.     Estimated Creatinine Clearance: 19.3 mL/min (A) (by C-G formula based on SCr of 2.29 mg/dL (H)).   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 (HCC) 08/28/2020   Uncontrolled type 2 diabetes mellitus with hyperglycemia, with long-term current use of insulin (HCC) 08/28/2020    Medications:  Infusions:   sodium chloride     sodium chloride     sodium chloride 10 mL/hr at 08/22/2020 2100   sodium chloride 10 mL/hr at 09/08/2020 2100   sodium chloride 10 mL/hr at 09/07/2020 1900   sodium chloride 10 mL/hr at 08/25/2020 2100   amiodarone 60 mg/hr (09/03/2020 2100)   ceFEPime (MAXIPIME) IV Stopped (09/04/20 1114)   heparin 700 Units/hr (08/25/2020 2100)   levETIRAcetam Stopped (08/22/2020 1755)   milrinone 0.375 mcg/kg/min (08/28/2020 2100)   norepinephrine (LEVOPHED) Adult  infusion 40 mcg/min (08/26/2020 2100)   sodium chloride     [START ON 09/06/2020] vancomycin     vasopressin 0.03 Units/min (08/22/2020 2103)    Assessment: 70 yo F in cardiogenic shock, now with IABP placed and a fib on amio gtt.   IABP at 1:1.   Hemoglobin and platelets improved from yesterday. No signs of bleeding per RN. TR band is almost off post cath.  Heparin level this evening is slightly below goal.  No overt bleeding or complications noted.  Goal of Therapy:  Heparin level 0.3-0.5 units/ml (IABP with a fib) Monitor platelets by anticoagulation protocol: Yes   Plan:  Increase IV heparin to 800 units/hr. Check heparin level in 8 hrs. Daily heparin level and CBC.  Reece Leader, Colon Flattery, BCCP Clinical Pharmacist  08/28/2020 9:16 PM   [facility] pharmacy phone numbers are listed on [email or web address]

## 2020-09-05 NOTE — Progress Notes (Signed)
Patient was transported on BIPAP from the CATH lab to [facility] without any complications.

## 2020-09-05 NOTE — Plan of Care (Signed)
  Problem: Coping: Goal: Ability to adjust to condition or change in health will improve Outcome: Progressing Goal: Ability to identify appropriate support needs will improve Outcome: Progressing   Problem: Health Behavior/Discharge Planning: Goal: Compliance with prescribed medication regimen will improve Outcome: Progressing   Problem: Medication: Goal: Risk for medication side effects will decrease Outcome: Progressing   Problem: Clinical Measurements: Goal: Diagnostic test results will improve Outcome: Progressing   Problem: Safety: Goal: Verbalization of understanding the information provided will improve Outcome: Progressing   Problem: Self-Concept: Goal: Ability to verbalize feelings about condition will improve Outcome: Progressing   Problem: Health Behavior/Discharge Planning: Goal: Ability to manage health-related needs will improve Outcome: Progressing   Problem: Clinical Measurements: Goal: Ability to maintain clinical measurements within normal limits will improve Outcome: Progressing Goal: Respiratory complications will improve Outcome: Progressing   Problem: Activity: Goal: Risk for activity intolerance will decrease Outcome: Progressing   Problem: Nutrition: Goal: Adequate nutrition will be maintained Outcome: Progressing   Problem: Coping: Goal: Level of anxiety will decrease Outcome: Progressing   Problem: Pain Managment: Goal: General experience of comfort will improve Outcome: Progressing   Problem: Safety: Goal: Ability to remain free from injury will improve Outcome: Progressing   Problem: Education: Goal: Knowledge of secondary prevention will improve Outcome: Progressing Goal: Knowledge of patient specific risk factors addressed and post discharge goals established will improve Outcome: Progressing   Problem: Coping: Goal: Will verbalize positive feelings about self Outcome: Progressing   Problem: Health Behavior/Discharge  Planning: Goal: Ability to manage health-related needs will improve Outcome: Progressing   Problem: Self-Care: Goal: Ability to communicate needs accurately will improve Outcome: Progressing   Problem: Ischemic Stroke/TIA Tissue Perfusion: Goal: Complications of ischemic stroke/TIA will be minimized Outcome: Progressing   Problem: Spontaneous Subarachnoid Hemorrhage Tissue Perfusion: Goal: Complications of Spontaneous Subarachnoid Hemorrhage will be minimized Outcome: Progressing

## 2020-09-06 LAB — CBC
HCT: 24.5 % — ABNORMAL LOW (ref 36.0–46.0)
HCT: 25 % — ABNORMAL LOW (ref 36.0–46.0)
Hemoglobin: 8.3 g/dL — ABNORMAL LOW (ref 12.0–15.0)
Hemoglobin: 8.2 g/dL — ABNORMAL LOW (ref 12.0–15.0)
MCH: 22.4 pg — ABNORMAL LOW (ref 26.0–34.0)
MCH: 22 pg — ABNORMAL LOW (ref 26.0–34.0)
MCHC: 33.9 g/dL (ref 30.0–36.0)
MCHC: 32.8 g/dL (ref 30.0–36.0)
MCV: 66 fL — ABNORMAL LOW (ref 80.0–100.0)
MCV: 67.2 fL — ABNORMAL LOW (ref 80.0–100.0)
Platelets: 110 10*3/uL — ABNORMAL LOW (ref 150–400)
Platelets: UNDETERMINED 10*3/uL (ref 150–400)
RBC: 3.71 MIL/uL — ABNORMAL LOW (ref 3.87–5.11)
RBC: 3.72 MIL/uL — ABNORMAL LOW (ref 3.87–5.11)
RDW: 16.6 % — ABNORMAL HIGH (ref 11.5–15.5)
RDW: 16.6 % — ABNORMAL HIGH (ref 11.5–15.5)
WBC: 12.7 10*3/uL — ABNORMAL HIGH (ref 4.0–10.5)
WBC: 12.5 10*3/uL — ABNORMAL HIGH (ref 4.0–10.5)
nRBC: 2.2 % — ABNORMAL HIGH (ref 0.0–0.2)
nRBC: 2.2 % — ABNORMAL HIGH (ref 0.0–0.2)

## 2020-09-06 LAB — COMPREHENSIVE METABOLIC PANEL
ALT: 180 U/L — ABNORMAL HIGH (ref 0–44)
AST: 48 U/L — ABNORMAL HIGH (ref 15–41)
Albumin: 2 g/dL — ABNORMAL LOW (ref 3.5–5.0)
Alkaline Phosphatase: 109 U/L (ref 38–126)
Anion gap: 12 (ref 5–15)
BUN: 45 mg/dL — ABNORMAL HIGH (ref 8–23)
CO2: 20 mmol/L — ABNORMAL LOW (ref 22–32)
Calcium: 8.3 mg/dL — ABNORMAL LOW (ref 8.9–10.3)
Chloride: 97 mmol/L — ABNORMAL LOW (ref 98–111)
Creatinine, Ser: 2.37 mg/dL — ABNORMAL HIGH (ref 0.44–1.00)
GFR, Estimated: 21 mL/min — ABNORMAL LOW (ref >60)
Glucose, Bld: 170 mg/dL — ABNORMAL HIGH (ref 70–99)
Potassium: 3.3 mmol/L — ABNORMAL LOW (ref 3.5–5.1)
Sodium: 129 mmol/L — ABNORMAL LOW (ref 135–145)
Total Bilirubin: 2 mg/dL — ABNORMAL HIGH (ref 0.3–1.2)
Total Protein: 5.5 g/dL — ABNORMAL LOW (ref 6.5–8.1)

## 2020-09-06 LAB — GLUCOSE, CAPILLARY
Glucose-Capillary: 159 mg/dL — ABNORMAL HIGH (ref 70–99)
Glucose-Capillary: 214 mg/dL — ABNORMAL HIGH (ref 70–99)
Glucose-Capillary: 164 mg/dL — ABNORMAL HIGH (ref 70–99)
Glucose-Capillary: 122 mg/dL — ABNORMAL HIGH (ref 70–99)

## 2020-09-06 LAB — BASIC METABOLIC PANEL
Anion gap: 13 (ref 5–15)
Anion gap: 11 (ref 5–15)
BUN: 45 mg/dL — ABNORMAL HIGH (ref 8–23)
BUN: 44 mg/dL — ABNORMAL HIGH (ref 8–23)
CO2: 19 mmol/L — ABNORMAL LOW (ref 22–32)
CO2: 21 mmol/L — ABNORMAL LOW (ref 22–32)
Calcium: 8.3 mg/dL — ABNORMAL LOW (ref 8.9–10.3)
Calcium: 8.5 mg/dL — ABNORMAL LOW (ref 8.9–10.3)
Chloride: 96 mmol/L — ABNORMAL LOW (ref 98–111)
Chloride: 100 mmol/L (ref 98–111)
Creatinine, Ser: 2.25 mg/dL — ABNORMAL HIGH (ref 0.44–1.00)
Creatinine, Ser: 2.31 mg/dL — ABNORMAL HIGH (ref 0.44–1.00)
GFR, Estimated: 23 mL/min — ABNORMAL LOW (ref >60)
GFR, Estimated: 22 mL/min — ABNORMAL LOW (ref >60)
Glucose, Bld: 150 mg/dL — ABNORMAL HIGH (ref 70–99)
Glucose, Bld: 227 mg/dL — ABNORMAL HIGH (ref 70–99)
Potassium: 3.6 mmol/L (ref 3.5–5.1)
Potassium: 3.9 mmol/L (ref 3.5–5.1)
Sodium: 128 mmol/L — ABNORMAL LOW (ref 135–145)
Sodium: 132 mmol/L — ABNORMAL LOW (ref 135–145)

## 2020-09-06 LAB — COOXEMETRY PANEL
Carboxyhemoglobin: 1.4 % (ref 0.5–1.5)
Carboxyhemoglobin: 1.8 % — ABNORMAL HIGH (ref 0.5–1.5)
Methemoglobin: 0.9 % (ref 0.0–1.5)
Methemoglobin: 0.8 % (ref 0.0–1.5)
O2 Saturation: 51.5 %
O2 Saturation: 56.1 %
Total hemoglobin: 11.9 g/dL — ABNORMAL LOW (ref 12.0–16.0)
Total hemoglobin: 9.7 g/dL — ABNORMAL LOW (ref 12.0–16.0)

## 2020-09-06 LAB — HEPARIN LEVEL (UNFRACTIONATED)
Heparin Unfractionated: 0.23 IU/mL — ABNORMAL LOW (ref 0.30–0.70)
Heparin Unfractionated: 0.27 IU/mL — ABNORMAL LOW (ref 0.30–0.70)

## 2020-09-06 LAB — PROCALCITONIN: Procalcitonin: 3.01 ng/mL

## 2020-09-06 LAB — LACTIC ACID, PLASMA: Lactic Acid, Venous: 1.6 mmol/L (ref 0.5–1.9)

## 2020-09-06 MED ORDER — POTASSIUM CHLORIDE 20 MEQ PO PACK: 40.0000 meq | PACK | Freq: Once | ORAL | Status: DC

## 2020-09-06 MED ORDER — POTASSIUM CHLORIDE 10 MEQ/50ML IV SOLN
10.0000 meq | INTRAVENOUS | Status: AC
  Administered 2020-09-06 (×4): 10 meq via INTRAVENOUS
  Filled 2020-09-06 (×4): qty 50

## 2020-09-06 MED ORDER — FUROSEMIDE 10 MG/ML IJ SOLN
40.0000 mg | Freq: Two times a day (BID) | INTRAMUSCULAR | Status: AC
  Administered 2020-09-06 (×2): 40 mg via INTRAVENOUS
  Filled 2020-09-06 (×2): qty 4

## 2020-09-06 MED ORDER — POTASSIUM CHLORIDE 10 MEQ/50ML IV SOLN
10.0000 meq | INTRAVENOUS | Status: AC
  Administered 2020-09-06 (×2): 10 meq via INTRAVENOUS
  Filled 2020-09-06 (×2): qty 50

## 2020-09-06 NOTE — Progress Notes (Signed)
ANTICOAGULATION CONSULT NOTE   Pharmacy Consult for heparin  Indication: atrial fibrillation and IABP  No Known Allergies  Patient Measurements: Height: 5' (152.4 cm) Weight: 64.5 kg (142 lb 3.2 oz) IBW/kg (Calculated) : 45.5 Heparin Dosing Weight: TBW  Vital Signs: Temp: 98.6 F (37 C) (08/20 0700) Temp Source: Core (08/20 0400) BP: 115/35 (08/20 0400) Pulse Rate: 129 (08/20 0700)  Labs: Recent Labs    09/04/20 0428 09/04/20 1343 09/04/2020 0202 08/18/2020 0815 09/14/2020 0826 08/18/2020 1948 08/18/2020 2126 09/06/20 0048 09/06/20 0516  HGB 9.4*   < > 9.7*   < > 11.2*  --  8.8*  --  8.3*  HCT 27.7*   < > 29.7*   < > 33.0*  --  26.2*  --  24.5*  PLT 135*   < > 143*  --   --   --  140*  --  110*  LABPROT  --   --  15.9*  --   --   --   --   --   --   INR  --   --  1.3*  --   --   --   --   --   --   HEPARINUNFRC <0.10*  --   --   --   --  0.17*  --   --  0.23*  CREATININE 2.31*   < > 2.15*  --   --  2.29*  --  2.25* 2.37*   < > = values in this interval not displayed.     Estimated Creatinine Clearance: 18.3 mL/min (A) (by C-G formula based on SCr of 2.37 mg/dL (H)).   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 (HCC) 08/28/2020   Uncontrolled type 2 diabetes mellitus with hyperglycemia, with long-term current use of insulin (HCC) 08/28/2020    Medications:  Infusions:   sodium chloride     sodium chloride     sodium chloride Stopped (09/06/20 0630)   sodium chloride Stopped (09/14/2020 2156)   sodium chloride 10 mL/hr at 09/11/2020 1900   sodium chloride 10 mL/hr at 09/06/20 0700   amiodarone 60 mg/hr (09/06/20 0700)   ceFEPime (MAXIPIME) IV Stopped (09/04/20 1114)   epinephrine 2 mcg/min (09/06/20 0700)   heparin 900 Units/hr (09/06/20 0700)   levETIRAcetam Stopped (09/06/20 0440)   milrinone  0.375 mcg/kg/min (09/06/20 0700)   norepinephrine (LEVOPHED) Adult infusion 40 mcg/min (09/06/20 0700)   potassium chloride 50 mL/hr at 09/06/20 0700   sodium chloride     vancomycin     vasopressin 0.01 Units/min (09/06/20 0700)    Assessment: 70 yo F in cardiogenic shock, now with IABP placed and a fib on amio gtt.   IABP at 1:1.   Heparin level still slightly below goal at 0.27 on 900 units/hr. Hgb has been trending down, stable on recheck at 8.2 this afternoon. Platelets clumped. No bleeding issues noted.   Goal of Therapy:  Heparin level 0.3-0.5 units/ml (IABP with a fib) Monitor platelets by anticoagulation protocol: Yes   Plan:  Increase IV heparin to 1000 units/hr. Daily heparin level and CBC.  Sheppard Coil PharmD., BCPS Clinical Pharmacist 09/06/2020 7:38 AM

## 2020-09-06 NOTE — Plan of Care (Signed)
  Problem: Coping: Goal: Ability to adjust to condition or change in health will improve Outcome: Progressing Goal: Ability to identify appropriate support needs will improve Outcome: Progressing   Problem: Health Behavior/Discharge Planning: Goal: Ability to manage health-related needs will improve Outcome: Not Progressing   Problem: Self-Care: Goal: Ability to communicate needs accurately will improve Outcome: Progressing

## 2020-09-06 NOTE — TOC Progression Note (Signed)
Transition of Care [facility]) - Progression Note    Patient Details  Name: Erica Duncan MRN: [id number] Date of Birth: 1950-07-17  [facility]) CM/SW Contact  Lorri Frederick, LCSW Phone Number: 09/06/2020, 9:11 AM  Clinical Narrative:   [facility] Cao note created (data already entered), referral sent out in hub for SNF.    Expected Discharge Plan: Skilled Nursing Facility Barriers to Discharge: Continued Medical Work up  Expected Discharge Plan and Services Expected Discharge Plan: Skilled Nursing Facility In-house Referral: Clinical Social Work     Living arrangements for the past 2 months: Single Family Home                                       Social Determinants of Health (SDOH) Interventions Housing Interventions: Intervention Not Indicated  Readmission Risk Interventions No flowsheet data found.

## 2020-09-06 NOTE — [place] FL2 (Signed)
be [place] MEDICAID FL2 LEVEL OF CARE SCREENING TOOL     IDENTIFICATION  Patient Name: Erica Duncan Birthdate: 1950-12-21 Sex: female Admission Date (Current Location): 09/03/2020  [facility] and [place] Number:  Producer, television/film/video and Address:  The [place], 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) 08/28/2020   Mixed diabetic hyperlipidemia associated with type 2 diabetes mellitus (HCC) 08/28/2020   Essential hypertension 08/28/2020   Chronic kidney disease, stage 3b (HCC) 08/28/2020   Uncontrolled type 2 diabetes mellitus with hyperglycemia, with long-term current use of insulin (HCC) 08/28/2020   Seizure (HCC) 08/28/2020    Orientation RESPIRATION BLADDER Height & Weight     Self, Time, Situation, Place  Vent Incontinent, External catheter Weight: 142 lb 3.2 oz (64.5 kg) Height:  5' (152.4 cm)  BEHAVIORAL SYMPTOMS/MOOD NEUROLOGICAL BOWEL NUTRITION STATUS      Incontinent Diet  AMBULATORY STATUS COMMUNICATION OF NEEDS Skin   Limited Assist Verbally Skin abrasions                       Personal Care Assistance Level of Assistance  Bathing, Feeding, Dressing Bathing Assistance: Maximum assistance Feeding assistance: Limited assistance Dressing Assistance: Limited assistance     Functional Limitations Info  Sight, Hearing, Speech Sight Info: Adequate Hearing Info: Adequate Speech Info: Adequate     SPECIAL CARE FACTORS FREQUENCY  PT (By licensed PT), OT (By licensed OT)     PT Frequency: 5x/week OT Frequency: 5x/week            Contractures Contractures Info: Not present    Additional Factors Info  Code Status, Allergies, Insulin Sliding Scale Code Status Info: Full Allergies Info: No Known Allergies   Insulin Sliding Scale Info: Please See Med List       Current Medications (09/06/2020):  This is the current hospital active medication list Current Facility-Administered Medications  Medication Dose Route Frequency Provider Last Rate Last Admin   0.9 %  sodium chloride infusion (Manually program via Guardrails IV Fluids)   Intravenous Continuous Bensimhon, Bevelyn Buckles, MD       0.9 %  sodium chloride infusion (Manually program via Guardrails IV Fluids)   Intravenous PRN Bensimhon, Bevelyn Buckles, MD       0.9 %  sodium chloride infusion   Intra-arterial PRN Andrey Farmer, PA-C       0.9 %  sodium chloride infusion   Intravenous PRN Bensimhon, Bevelyn Buckles, MD   Stopped at 09/06/20 0759   0.9 %  sodium chloride infusion  250 mL Intravenous PRN Bensimhon, Bevelyn Buckles, MD   Stopped at 08/29/2020 2156   0.9 %  sodium chloride infusion   Intravenous Continuous Bensimhon, Bevelyn Buckles, MD 10 mL/hr at 09/06/2020 1900 New Bag/Given (Non-Interop) at 09/16/2020 1900   0.9 %  sodium chloride infusion   Intravenous Continuous Bensimhon, Bevelyn Buckles, MD 10 mL/hr  at 09/06/20 0800 Infusion Verify at 09/06/20 0800   acetaminophen (TYLENOL) tablet 650 mg  650 mg Oral Q4H PRN Bensimhon, Bevelyn Buckles, MD       amiodarone (NEXTERONE PREMIX) 360-4.14 MG/200ML-% (1.8 mg/mL) IV infusion  60 mg/hr Intravenous Continuous Clegg, Amy D, NP 33.3 mL/hr at 09/06/20 0800 60 mg/hr at 09/06/20 0800   ceFEPIme (MAXIPIME) 2 g in sodium chloride 0.9 % 100 mL IVPB  2 g Intravenous Q24H Earnie Larsson, [facility]   Stopped at 09/04/20 1114   Chlorhexidine Gluconate Cloth 2 % PADS 6 each  6 each Topical Daily Bensimhon, Bevelyn Buckles, MD   6  each at 09/15/2020 1200   EPINEPHrine (ADRENALIN) 5 mg in NS 250 mL (0.02 mg/mL) premix infusion  0-2 mcg/min Intravenous Titrated Bensimhon, Bevelyn Buckles, MD 6 mL/hr at 09/06/20 0800 2 mcg/min at 09/06/20 0800   furosemide (LASIX) injection 40 mg  40 mg Intravenous BID Laurey Morale, MD       heparin ADULT infusion 100 units/mL (25000 units/241mL)  900 Units/hr Intravenous Continuous Titus Mould, RPH 9 mL/hr at 09/06/20 0800 900 Units/hr at 09/06/20 0800   insulin aspart (novoLOG) injection 0-15 Units  0-15 Units Subcutaneous TID WC Clegg, Amy D, NP   3 Units at 09/06/20 0819   insulin aspart (novoLOG) injection 0-5 Units  0-5 Units Subcutaneous QHS Clegg, Amy D, NP   2 Units at 09/03/20 2227   insulin aspart (novoLOG) injection 4 Units  4 Units Subcutaneous TID WC Clegg, Amy D, NP   4 Units at 09/04/20 1750   insulin glargine-yfgn (SEMGLEE) injection 20 Units  20 Units Subcutaneous QHS Clegg, Amy D, NP   20 Units at 09/14/2020 2300   levETIRAcetam (KEPPRA) 250 mg in sodium chloride 0.9 % 100 mL IVPB  250 mg Intravenous Q12H Clegg, Amy D, NP   Stopped at 09/06/20 0440   LORazepam (ATIVAN) injection 2 mg  2 mg Intravenous Once PRN Clegg, Amy D, NP       milrinone (PRIMACOR) 20 MG/100 ML (0.2 mg/mL) infusion  0.375 mcg/kg/min Intravenous Continuous Bensimhon, Bevelyn Buckles, MD 7.57 mL/hr at 09/06/20 0800 0.375 mcg/kg/min at 09/06/20 0800   norepinephrine (LEVOPHED) 16 mg in premix infusion  0-40 mcg/min Intravenous Titrated Bensimhon, Bevelyn Buckles, MD 37.5 mL/hr at 09/06/20 0822 40 mcg/min at 09/06/20 0822   ondansetron (ZOFRAN) injection 4 mg  4 mg Intravenous Q6H PRN Clegg, Amy D, NP   4 mg at 08/21/2020 2135   polyethylene glycol (MIRALAX / GLYCOLAX) packet 17 g  17 g Oral Daily PRN Clegg, Amy D, NP   17 g at 08/31/20 1702   potassium chloride 10 mEq in 50 mL *CENTRAL LINE* IVPB  10 mEq Intravenous Q1 Hr x 4 Karl Ito, MD 50 mL/hr at 09/06/20 0800 Infusion Verify at 09/06/20 0800   sodium chloride  0.9 % bolus 250 mL  250 mL Intravenous Once PRN Clegg, Amy D, NP       sodium chloride flush (NS) 0.9 % injection 10-40 mL  10-40 mL Intracatheter Q12H Bensimhon, Bevelyn Buckles, MD   10 mL at 09/17/2020 2156   sodium chloride flush (NS) 0.9 % injection 10-40 mL  10-40 mL Intracatheter PRN Bensimhon, Bevelyn Buckles, MD       sodium chloride flush (NS) 0.9 % injection 3 mL  3 mL Intravenous Once Clegg, Amy D, NP       sodium chloride flush (NS) 0.9 % injection 3 mL  3 mL Intravenous  Q12H Clegg, Amy D, NP   3 mL at 09/04/20 2211   sodium chloride flush (NS) 0.9 % injection 3 mL  3 mL Intravenous Q12H Bensimhon, Bevelyn Buckles, MD       sodium chloride flush (NS) 0.9 % injection 3 mL  3 mL Intravenous PRN Bensimhon, Bevelyn Buckles, MD       traMADol Janean Sark) tablet 50 mg  50 mg Oral Q12H PRN Filbert Schilder, Amy D, NP   50 mg at 09/04/20 2208   vancomycin (VANCOCIN) IVPB 1000 mg/200 mL premix  1,000 mg Intravenous Q48H Earnie Larsson, RPH       vasopressin (PITRESSIN) 20 Units in sodium chloride 0.9 % 100 mL infusion-*FOR SHOCK*  0-0.03 Units/min Intravenous Continuous Bensimhon, Bevelyn Buckles, MD 3 mL/hr at 09/06/20 0800 0.01 Units/min at 09/06/20 0800     Discharge Medications: Please see discharge summary for a list of discharge medications.  Relevant Imaging Results:  Relevant Lab Results:   Additional Information SSN#: [id number] COVID-19 Vaccine Moderna X3  Lorri Frederick, LCSW

## 2020-09-06 NOTE — Progress Notes (Signed)
Difficulties maintaining MAP throughout shift so far. Care decisions made by Dr. Gala Romney and Dr. Lendell Caprice, who came to bedside to evaluate patient, to add vaso and epi to better support BP and cardiac function. Labs were sent and relayed to MD's as they were resulted.   Patient is confused as reported by daughter at bedside. She reports that the past couple nights pt has said some "off" and "weird" things such as being in a really long dream. Discussed with her she is likely experiencing some ICU delirium in conjunction with her heart being weak. RN is attempting to promote rest by decreasing stimulation and lights in the room. Patient is only oriented to self and is unable to identify daughter correctly; patient is not oriented to place or time.   Patient remains in Afib/flutter at this time. She reports a generalized complaint of not feeling right, pain, and nausea. Dr. Gala Romney ordered one dose of ativan. Since administration, patient has been mumbling and doesn't seem to be resting or sleeping. RN and daughter have attempted to reorient patient.   IABP site stable and extremities are warm to touch. HR remains high 120's, CVP 5-8, PAP 36/22, last wedge result was 10, last CO/CI was 3.32/2.02.

## 2020-09-06 NOTE — Progress Notes (Signed)
Patient ID: Erica Duncan, female   DOB: 05-Sep-1950, 70 y.o.   MRN: [id number]    Advanced Heart Failure Rounding Note   Subjective:    8/11 Admitted with abdominal pain -> developed shock, AKI and shock liver. EF 15%. Developed AF 8/15 AHF team consulted moved to IC. Started milrinone and NE 8/17:  Diuresed with IV lasix.  Hypotensive with increased pressor requirements with Norepi up to 55 mcg. Given 250 cc NS bolus. Milrinone cut back to 0.125 mcg. Imrproved with NE back down to 30 mcg. Hgb down 9.9. Sent for stat CT abd --negative for bleed. Heparin stopped.  8/17 VT arrest -> CPR & defib x 1 8/20 IABP placed  Today patient is on epinephrine 2, milrinone 0.375, NE 40, vasopressin 0.01. IABP 1:1, tracking well even though tachycardic.  I/Os even, good UOP (2760 cc for the last 24 hrs).  Lactate 1.6, creatinine 2.25 => 2.37.   She remains in atrial flutter rate 120s-130s on amiodarone gtt 60 + heparin gtt.   Plts mildly low at 110 (have been mildly decreased).   PCT 1.7 on 8/19, on empiric vancomycin/cefepime with negative cultures.  Afebrile.   Swan numbers: CVP 12 PA 38/22 CI 2.5 SVR 1076 Co-ox 56%  LHC/RHC 8/19 on NE 30, mil 0.125, VP 0.03: Ao = 91/58 (71) LV = 90/23 RA = 15 RV = 47/16 PA = 60/28 (40) PCW = 32 (v = 40) Fick cardiac output/index = 2.9/1.7 PVR = 2.8 WU SVR = 1572 FA sat = 90% PA sat = 32%, 34% PAPI = 2.1 CPO = 0.45  Assessment: 1. Normal coronary arteries 2. Severe NICM 3. Cardiogenic shock  Objective:   Weight Range:  Vital Signs:   Temp:  [97 F (36.1 C)-99.3 F (37.4 C)] 98.8 F (37.1 C) (08/20 0745) Pulse Rate:  [28-132] 131 (08/20 0745) Resp:  [14-34] 24 (08/20 0745) BP: (93-121)/(24-55) 115/35 (08/20 0400) SpO2:  [88 %-100 %] 98 % (08/20 0745) FiO2 (%):  [60 %] 60 % (08/19 1010) Weight:  [64.5 kg] 64.5 kg (08/20 0500) Last BM Date: 09/02/20  Weight change: Filed Weights   09/03/20 0500 09/04/20 0500 09/06/20 0500   Weight: 67 kg 67.3 kg 64.5 kg    Intake/Output:   Intake/Output Summary (Last 24 hours) at 09/06/2020 0807 Last data filed at 09/06/2020 0700 Gross per 24 hour  Intake 2985.63 ml  Output 2760 ml  Net 225.63 ml    Physical Exam: General: NAD Neck: JVP 10-12, no thyromegaly or thyroid nodule.  Lungs: Clear to auscultation bilaterally with normal respiratory effort. CV: Lateral PMI.  Heart tachy, irregular S1/S2, no S3/S4, no murmur.  No peripheral edema.   Abdomen: Soft, nontender, no hepatosplenomegaly, no distention.  Skin: Intact without lesions or rashes.  Neurologic: Alert and oriented x 3.  Psych: Normal affect. Extremities: No clubbing or cyanosis.  HEENT: Normal.   Telemetry: Suspect atrial flutter rate 120s-130s Personally reviewed   Labs: Basic Metabolic Panel: Recent Labs  Lab 08/31/20 0923 09/01/20 0337 09/01/20 0843 09/04/20 0428 09/04/20 1343 09/16/2020 0202 09/16/2020 0815 09/16/20 0826 09/16/2020 1948 09/06/20 0048 09/06/20 0516  NA 139 136   < > 136 133* 132* 133*  128* 133* 128* 128* 129*  K 3.6 4.7   < > 3.1* 3.5 3.7 4.2  3.7 4.2 3.5 3.6 3.3*  CL 108 107   < > 98 100 100  --   --  96* 96* 97*  CO2 19* 10*   < >  26 25 21*  --   --  21* 19* 20*  GLUCOSE 119* 127*   < > 137* 172* 109*  --   --  129* 150* 170*  BUN 33* 47*   < > 56* 53* 48*  --   --  45* 45* 45*  CREATININE 1.81* 2.27*   < > 2.31* 2.21* 2.15*  --   --  2.29* 2.25* 2.37*  CALCIUM 8.5* 8.8*   < > 8.8* 8.3* 8.8*  --   --  8.2* 8.3* 8.3*  MG 1.8 1.8  --  1.5*  --  2.0  --   --   --   --   --    < > = values in this interval not displayed.    Liver Function Tests: Recent Labs  Lab 09/02/20 0416 09/03/20 0303 09/04/20 0428 09/08/2020 0202 09/06/20 0516  AST 1,758* 632* 200* 106* 48*  ALT 857* 621* 363* 293* 180*  ALKPHOS 163* 157* 130* 140* 109  BILITOT 1.6* 1.9* 1.6* 1.3* 2.0*  PROT 5.2* 5.4* 5.3* 6.0* 5.5*  ALBUMIN 2.2* 2.3* 2.1* 2.3* 2.0*   No results for input(s): LIPASE,  AMYLASE in the last 168 hours. Recent Labs  Lab 09/01/20 1501 09/03/20 0313 08/28/2020 2126  AMMONIA 73* 28 26    CBC: Recent Labs  Lab 09/01/20 0337 09/01/20 0843 09/04/20 0428 09/04/20 1343 09/04/2020 0202 09/16/2020 0815 08/23/2020 0826 08/31/2020 2126 09/06/20 0516  WBC 9.6   < > 8.5 9.1 10.8*  --   --  13.4* 12.7*  NEUTROABS 8.0*  --   --   --   --   --   --   --   --   HGB 14.2   < > 9.4* 9.3* 9.7* 11.6*  11.2* 11.2* 8.8* 8.3*  HCT 46.2*   < > 27.7* 28.3* 29.7* 34.0*  33.0* 33.0* 26.2* 24.5*  MCV 73.1*   < > 66.3* 67.4* 67.5*  --   --  67.0* 66.0*  PLT 134*   < > 135* 128* 143*  --   --  140* 110*   < > = values in this interval not displayed.    Cardiac Enzymes: No results for input(s): CKTOTAL, CKMB, CKMBINDEX, TROPONINI in the last 168 hours.  BNP: BNP (last 3 results) No results for input(s): BNP in the last 8760 hours.  ProBNP (last 3 results) No results for input(s): PROBNP in the last 8760 hours.    Other results:  Imaging: CARDIAC CATHETERIZATION  Result Date: 09/16/2020 Findings: Done on NE 30, milrinone 0.125, VP 0.03 Ao = 91/58 (71) LV = 90/23 RA = 15 RV = 47/16 PA = 60/28 (40) PCW = 32 (v = 40) Fick cardiac output/index = 2.9/1.7 PVR = 2.8 WU SVR = 1572 FA sat = 90% PA sat = 32%, 34% PAPI = 2.1 CPO = 0.45 Assessment: 1. Normal coronary arteries 2. Severe NICM 3. Cardiogenic shock Plan/Discussion: IABP placed for support. Adjust inotropes and diurese. If limited response will need to consider axillary Impella as bridge to VAD if she is a candidate. Arvilla Meres, MD 9:23 AM  Port CXR  Result Date: 09/04/2020 CLINICAL DATA:  Central line placement.  Congestive heart failure. EXAM: PORTABLE CHEST 1 VIEW COMPARISON:  09/01/2020 FINDINGS: A new right jugular Swan-Ganz catheter seen with tip overlying the right intralobar pulmonary artery. Left jugular central venous catheter is unchanged in position, with tip overlying the proximal to mid right atrium. No  evidence of pneumothorax. Cardiac  enlargement is unchanged. Aortic atherosclerotic calcification noted. Increased diffuse bilateral pulmonary airspace disease is seen compared to prior exam. IMPRESSION: New right jugular Swan-Ganz catheter tip overlies the right intralobar pulmonary artery. No evidence of pneumothorax. Increased diffuse bilateral pulmonary airspace disease. Electronically Signed   By: Danae Orleans M.D.   On: 2020-10-03 13:59   ECHOCARDIOGRAM LIMITED  Result Date: 09/04/2020    ECHOCARDIOGRAM LIMITED REPORT   Patient Name:   MAHNIYA SABAS [facility] Date of Exam: 09/04/2020 Medical Rec #:  [id number]                 Height:       60.0 in Accession #:    [id number]                Weight:       148.4 lb Date of Birth:  06-11-1950                 BSA:          1.644 m Patient Age:    70 years                  BP:           124/52 mmHg Patient Gender: F                         HR:           71 bpm. Exam Location:  Inpatient Procedure: Limited Echo and 2D Echo Indications:    CHF  History:        Patient has prior history of Echocardiogram examinations, most                 recent 09/01/2020. Stroke, Arrythmias:Atrial Fibrillation; Risk                 Factors:Hypertension, Diabetes and Dyslipidemia.  Sonographer:    Roosvelt Maser RDCS Referring Phys: [phone] AMY D CLEGG IMPRESSIONS  1. Left ventricular ejection fraction, by estimation, is 25 to 30%. The left ventricle has severely decreased function. The left ventricle demonstrates global hypokinesis. There is mild left ventricular hypertrophy.  2. Right ventricular systolic function is moderately reduced. The right ventricular size is normal.  3. A small pericardial effusion is present.  4. The mitral valve is abnormal. There is moderate thickening of the mitral valve leaflets. Restricted leaflet motion. Mitral regurgitation was not assessed  5. The aortic valve was not well visualized. Aortic valve regurgitation is not visualized.  6. The inferior vena  cava is normal in size with <50% respiratory variability, suggesting right atrial pressure of 8 mmHg. FINDINGS  Left Ventricle: Left ventricular ejection fraction, by estimation, is 25 to 30%. The left ventricle has severely decreased function. The left ventricle demonstrates global hypokinesis. The left ventricular internal cavity size was normal in size. There is mild left ventricular hypertrophy. Right Ventricle: The right ventricular size is normal. Right ventricular systolic function is moderately reduced. Pericardium: A small pericardial effusion is present. Mitral Valve: The mitral valve is abnormal. There is moderate thickening of the mitral valve leaflet(s). Tricuspid Valve: The tricuspid valve is abnormal. Aortic Valve: The aortic valve was not well visualized. Aortic valve regurgitation is not visualized. Aorta: The aortic root and ascending aorta are structurally normal, with no evidence of dilitation. Venous: The inferior vena cava is normal in size with less than 50% respiratory variability, suggesting right atrial pressure of 8 mmHg. LEFT VENTRICLE  PLAX 2D LVIDd:         5.00 cm LVIDs:         4.30 cm LV PW:         1.10 cm LV IVS:        0.80 cm LVOT diam:     1.70 cm  3D Volume EF: LVOT Area:     2.27 cm 3D EF:        35 %                         LV EDV:       159 ml                         LV ESV:       103 ml                         LV SV:        56 ml IVC IVC diam: 1.50 cm LEFT ATRIUM         Index LA diam:    3.20 cm 1.95 cm/m   AORTA Ao Root diam: 3.00 cm Ao Asc diam:  2.70 cm  SHUNTS Systemic Diam: 1.70 cm Epifanio Lesches MD Electronically signed by Epifanio Lesches MD Signature Date/Time: 09/04/2020/11:10:42 AM    Final      Medications:     Scheduled Medications:  Chlorhexidine Gluconate Cloth  6 each Topical Daily   furosemide  40 mg Intravenous BID   insulin aspart  0-15 Units Subcutaneous TID WC   insulin aspart  0-5 Units Subcutaneous QHS   insulin aspart  4 Units  Subcutaneous TID WC   insulin glargine-yfgn  20 Units Subcutaneous QHS   sodium chloride flush  10-40 mL Intracatheter Q12H   sodium chloride flush  3 mL Intravenous Once   sodium chloride flush  3 mL Intravenous Q12H   sodium chloride flush  3 mL Intravenous Q12H    Infusions:  sodium chloride     sodium chloride     sodium chloride Stopped (09/06/20 0630)   sodium chloride Stopped (09/08/2020 2156)   sodium chloride 10 mL/hr at 08/26/2020 1900   sodium chloride 10 mL/hr at 09/06/20 0700   amiodarone 60 mg/hr (09/06/20 0700)   ceFEPime (MAXIPIME) IV Stopped (09/04/20 1114)   epinephrine 2 mcg/min (09/06/20 0700)   heparin 900 Units/hr (09/06/20 0700)   levETIRAcetam Stopped (09/06/20 0440)   milrinone 0.375 mcg/kg/min (09/06/20 0700)   norepinephrine (LEVOPHED) Adult infusion 40 mcg/min (09/06/20 0700)   potassium chloride 10 mEq (09/06/20 0759)   sodium chloride     vancomycin     vasopressin 0.01 Units/min (09/06/20 0700)    PRN Medications: sodium chloride, Place/Maintain arterial line **AND** sodium chloride, sodium chloride, sodium chloride, acetaminophen, LORazepam, ondansetron (ZOFRAN) IV, polyethylene glycol, sodium chloride, sodium chloride flush, sodium chloride flush, traMADol   Assessment/Plan :   1. Acute systolic HF -> cardiogenic shock - New diagnosis this admit, nonischemic cardiomyopathy with no CAD on 8/19 cath.  - EF ~ 20-25%, MR, TR on echo - Repeat echo 9/18 25-30% - Initial lactate 4.8 -> 1.5. Co-ox 41% on 8/15 - Persistent shock despite inotropes/pressors, IABP placed 8/19.  Now with IABP 1:1, epinephrine 2, milrinone 0.375, NE 40, vasopressin 0.01.  Co-ox 56% now with CI improved now that she is on IABP (2.5), lactate normal.  With tachycardia and  improved CO, would wean epinephrine and increase vasopressin if needed for MAP.  - No GDMT yet due to shock - UOP improved, I/Os even.  CVP 12 today.  Lasix 40 mg IV bid today and replace K.  - Age prohibits  transplant. If doesn't begin to stabilize will need to consider whether she is candidate for Impella 5.5 bridge to recovery or possible durable VAD.   2. VT arrest on 8/17 - in setting of hypokalemia - Continue amiodarone gtt  - Keep K > 4.0 Mg > 2.0  3. Atrial fibrillation/flutter with RVR: - Was in NSR on admit. Noted 08/14. In/out AF on 8/15 - Today looks like AFL with RVR. Will get ECG.  - Continue heparin gtt for now with AF/AFL and IABP.  Hgb trending down to 8.3 so follow closely.  - Continue amiodarone gtt.   4. AKI on CKD, stage IIIb - Likely due to ATN in setting of #1 - Cr 1.74 > 2.27 -> 2.7 -> 2.7 -> 2.6->2.3-> 2.1 -> 2.37.  - Small bump today, had contrast with cath 8/19.    5. Shock liver - LFTs continue to trend down today.    6. Insulin dependent DM II -SSI -Alc 7.9   7. Hypokalemia - Supplement today, discussed with pharmacy.    8. Hx CVA/seizure activity -Followed by Neurology -On Keppra -No acute infarct on MRI brain and CT head  9. Microcytic anemia, iron-deficiency - iron sats low - given IV Iron 8/17 - CT abd negative for bleed.  - No obvious source of bleeding.  - Back on heparin gtt with hgb 8.3, continue to follow closely and transfuse hgb < 8.   10. Thrombocytopenia - Mild, relatively stable.  Most likely due to critical illness/IABP.   11. ID - On vancomycin/cefepime empirically with shock and PCT elevated at 1.7.  No definite source. Afebrile today with WBCs 12.7, cultures NGTD.   CRITICAL CARE Performed by: Marca Anconaalton Sunny Gains  Total critical care time: 45 minutes  Critical care time was exclusive of separately billable procedures and treating other patients.  Critical care was necessary to treat or prevent imminent or life-threatening deterioration.  Critical care was time spent personally by me (independent of midlevel providers or residents) on the following activities: development of treatment plan with patient and/or surrogate as well  as nursing, discussions with consultants, evaluation of patient's response to treatment, examination of patient, obtaining history from patient or surrogate, ordering and performing treatments and interventions, ordering and review of laboratory studies, ordering and review of radiographic studies, pulse oximetry and re-evaluation of patient's condition.  Length of Stay: 9   Marca Anconaalton Catarino Vold  MD 09/06/2020, 8:07 AM  Advanced Heart Failure Team Pager [phone] (M-F; 7a - 4p)  Please contact [facility] for night-coverage after hours (4p -7a ) and weekends on [email or web address]

## 2020-09-06 NOTE — Progress Notes (Signed)
ANTICOAGULATION CONSULT NOTE   Pharmacy Consult for heparin  Indication: atrial fibrillation and IABP  No Known Allergies  Patient Measurements: Height: 5' (152.4 cm) Weight: 67.3 kg (148 lb 5.9 oz) IBW/kg (Calculated) : 45.5 Heparin Dosing Weight: TBW  Vital Signs: Temp: 97.3 F (36.3 C) (08/20 0515) Temp Source: Core (08/20 0400) BP: 115/35 (08/20 0400) Pulse Rate: 131 (08/20 0515)  Labs: Recent Labs    09/04/20 0428 09/04/20 1343 08/26/2020 0202 08/26/2020 0815 08/28/2020 0826 09/04/2020 1948 08/25/2020 2126 09/06/20 0048 09/06/20 0516  HGB 9.4* 9.3* 9.7* 11.6*  11.2* 11.2*  --  8.8*  --   --   HCT 27.7* 28.3* 29.7* 34.0*  33.0* 33.0*  --  26.2*  --   --   PLT 135* 128* 143*  --   --   --  140*  --   --   LABPROT  --   --  15.9*  --   --   --   --   --   --   INR  --   --  1.3*  --   --   --   --   --   --   HEPARINUNFRC <0.10*  --   --   --   --  0.17*  --   --  0.23*  CREATININE 2.31* 2.21* 2.15*  --   --  2.29*  --  2.25*  --      Estimated Creatinine Clearance: 19.6 mL/min (A) (by C-G formula based on SCr of 2.25 mg/dL (H)).   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 (HCC) 08/28/2020   Uncontrolled type 2 diabetes mellitus with hyperglycemia, with long-term current use of insulin (HCC) 08/28/2020    Medications:  Infusions:   sodium chloride     sodium chloride     sodium chloride 10 mL/hr at 09/06/20 0500   sodium chloride Stopped (08/23/2020 2156)   sodium chloride 10 mL/hr at 08/31/2020 1900   sodium chloride 10 mL/hr at 09/06/20 0500   amiodarone 60 mg/hr (09/06/20 0505)   ceFEPime (MAXIPIME) IV Stopped (09/04/20 1114)   epinephrine 2 mcg/min (09/06/20 0500)   heparin 800 Units/hr (09/06/20 0500)   levETIRAcetam Stopped (09/06/20 0440)   milrinone 0.375 mcg/kg/min (09/06/20 0500)    norepinephrine (LEVOPHED) Adult infusion 40 mcg/min (09/06/20 0500)   sodium chloride     vancomycin     vasopressin 0.02 Units/min (09/06/20 0506)    Assessment: 70 yo F in cardiogenic shock, now with IABP placed and a fib on amio gtt.   IABP at 1:1  Hemoglobin and platelets improved from yesterday. No signs of bleeding per RN. TR band is almost off post cath.  Heparin level remains subtherapeutic (0.23) on gtt at 800 units/hr. No issues with line or bleeding reported per RN. Hgb back down to 8.8 (?if 11.2 yesterday evening was accurate).   Goal of Therapy:  Heparin level 0.3-0.5 units/ml (IABP with a fib) Monitor platelets by anticoagulation protocol: Yes   Plan:  Increase IV heparin to 900 units/hr Check heparin level in 8 hrs  Christoper Fabian, PharmD, BCPS Please see amion for complete clinical pharmacist phone list  09/06/2020 6:02 AM

## 2020-09-07 ENCOUNTER — Inpatient Hospital Stay (HOSPITAL_COMMUNITY): Payer: Medicare HMO

## 2020-09-07 LAB — CBC
HCT: 23.8 % — ABNORMAL LOW (ref 36.0–46.0)
Hemoglobin: 8.1 g/dL — ABNORMAL LOW (ref 12.0–15.0)
MCH: 22.6 pg — ABNORMAL LOW (ref 26.0–34.0)
MCHC: 34 g/dL (ref 30.0–36.0)
MCV: 66.3 fL — ABNORMAL LOW (ref 80.0–100.0)
Platelets: 100 10*3/uL — ABNORMAL LOW (ref 150–400)
RBC: 3.59 MIL/uL — ABNORMAL LOW (ref 3.87–5.11)
RDW: 17 % — ABNORMAL HIGH (ref 11.5–15.5)
WBC: 12.9 10*3/uL — ABNORMAL HIGH (ref 4.0–10.5)
nRBC: 1.2 % — ABNORMAL HIGH (ref 0.0–0.2)

## 2020-09-07 LAB — BASIC METABOLIC PANEL
Anion gap: 10 (ref 5–15)
Anion gap: 10 (ref 5–15)
BUN: 41 mg/dL — ABNORMAL HIGH (ref 8–23)
BUN: 40 mg/dL — ABNORMAL HIGH (ref 8–23)
CO2: 22 mmol/L (ref 22–32)
CO2: 23 mmol/L (ref 22–32)
Calcium: 8.5 mg/dL — ABNORMAL LOW (ref 8.9–10.3)
Calcium: 8.6 mg/dL — ABNORMAL LOW (ref 8.9–10.3)
Chloride: 100 mmol/L (ref 98–111)
Chloride: 101 mmol/L (ref 98–111)
Creatinine, Ser: 2.26 mg/dL — ABNORMAL HIGH (ref 0.44–1.00)
Creatinine, Ser: 2.26 mg/dL — ABNORMAL HIGH (ref 0.44–1.00)
GFR, Estimated: 23 mL/min — ABNORMAL LOW (ref >60)
GFR, Estimated: 23 mL/min — ABNORMAL LOW (ref >60)
Glucose, Bld: 102 mg/dL — ABNORMAL HIGH (ref 70–99)
Glucose, Bld: 78 mg/dL (ref 70–99)
Potassium: 2.9 mmol/L — ABNORMAL LOW (ref 3.5–5.1)
Potassium: 3.2 mmol/L — ABNORMAL LOW (ref 3.5–5.1)
Sodium: 132 mmol/L — ABNORMAL LOW (ref 135–145)
Sodium: 134 mmol/L — ABNORMAL LOW (ref 135–145)

## 2020-09-07 LAB — HEPARIN LEVEL (UNFRACTIONATED)
Heparin Unfractionated: 0.19 IU/mL — ABNORMAL LOW (ref 0.30–0.70)
Heparin Unfractionated: 0.2 IU/mL — ABNORMAL LOW (ref 0.30–0.70)

## 2020-09-07 LAB — GLUCOSE, CAPILLARY
Glucose-Capillary: 81 mg/dL (ref 70–99)
Glucose-Capillary: 106 mg/dL — ABNORMAL HIGH (ref 70–99)

## 2020-09-07 LAB — MAGNESIUM
Magnesium: 1.6 mg/dL — ABNORMAL LOW (ref 1.7–2.4)
Magnesium: 2 mg/dL (ref 1.7–2.4)

## 2020-09-07 LAB — PROCALCITONIN: Procalcitonin: 4.29 ng/mL

## 2020-09-07 LAB — COOXEMETRY PANEL
Carboxyhemoglobin: 1.6 % — ABNORMAL HIGH (ref 0.5–1.5)
Methemoglobin: 0.8 % (ref 0.0–1.5)
O2 Saturation: 56.2 %
Total hemoglobin: 8.4 g/dL — ABNORMAL LOW (ref 12.0–16.0)

## 2020-09-07 MED ORDER — POTASSIUM CHLORIDE 10 MEQ/50ML IV SOLN
10.0000 meq | INTRAVENOUS | Status: AC
  Administered 2020-09-07 (×6): 10 meq via INTRAVENOUS
  Filled 2020-09-07 (×7): qty 50

## 2020-09-07 MED ORDER — MAGNESIUM SULFATE 2 GM/50ML IV SOLN
2.0000 g | Freq: Once | INTRAVENOUS | Status: AC
  Administered 2020-09-07: 2 g via INTRAVENOUS
  Filled 2020-09-07: qty 50

## 2020-09-07 MED ORDER — POTASSIUM CHLORIDE 10 MEQ/50ML IV SOLN
INTRAVENOUS | Status: AC
  Filled 2020-09-07: qty 50

## 2020-09-07 MED ORDER — FUROSEMIDE 10 MG/ML IJ SOLN
40.0000 mg | Freq: Once | INTRAMUSCULAR | Status: AC
  Administered 2020-09-07: 40 mg via INTRAVENOUS
  Filled 2020-09-07: qty 4

## 2020-09-07 MED ORDER — FUROSEMIDE 10 MG/ML IJ SOLN
10.0000 mg/h | INTRAVENOUS | Status: DC
  Administered 2020-09-07 – 2020-09-08 (×3): 10 mg/h via INTRAVENOUS
  Filled 2020-09-07 (×4): qty 20

## 2020-09-07 MED ORDER — ZOLPIDEM TARTRATE 5 MG PO TABS
2.5000 mg | ORAL_TABLET | Freq: Every evening | ORAL | Status: DC | PRN
  Filled 2020-09-07: qty 1

## 2020-09-07 MED ORDER — POTASSIUM CHLORIDE CRYS ER 20 MEQ PO TBCR: 40.0000 meq | EXTENDED_RELEASE_TABLET | Freq: Once | ORAL | Status: DC

## 2020-09-07 MED ORDER — POTASSIUM CHLORIDE 10 MEQ/50ML IV SOLN
10.0000 meq | INTRAVENOUS | Status: AC
  Administered 2020-09-07 (×4): 10 meq via INTRAVENOUS
  Filled 2020-09-07 (×3): qty 50

## 2020-09-07 NOTE — Progress Notes (Signed)
ANTICOAGULATION CONSULT NOTE   Pharmacy Consult for heparin  Indication: atrial fibrillation and IABP  Labs: Recent Labs    09/08/2020 0202 09/08/2020 0815 08/30/2020 2126 09/06/20 0048 09/06/20 0516 09/06/20 1206 09/07/20 0332  HGB 9.7*   < > 8.8*  --  8.3* 8.2*  --   HCT 29.7*   < > 26.2*  --  24.5* 25.0*  --   PLT 143*  --  140*  --  110* PLATELET CLUMPS NOTED ON SMEAR, UNABLE TO ESTIMATE  --   LABPROT 15.9*  --   --   --   --   --   --   INR 1.3*  --   --   --   --   --   --   HEPARINUNFRC  --    < >  --   --  0.23* 0.27* 0.19*  CREATININE 2.15*   < >  --  2.25* 2.37* 2.31*  --    < > = values in this interval not displayed.    Assessment: 70 yo F in cardiogenic shock, now with IABP placed and a fib on amio gtt.   Heparin level below goal at 0.19 on 1000 units/hr. No bleeding issues noted.   Goal of Therapy:  Heparin level 0.3-0.5 units/ml (IABP with a fib) Monitor platelets by anticoagulation protocol: Yes   Plan:  Increase IV heparin to 1100 units/hr. Check heparin level in 8 hrs Daily heparin level and CBC.  Talbert Cage, PharmD Clinical Pharmacist 09/07/2020 4:21 AM

## 2020-09-07 NOTE — Progress Notes (Signed)
ANTICOAGULATION CONSULT NOTE   Pharmacy Consult for heparin  Indication: atrial fibrillation and IABP  No Known Allergies  Patient Measurements: Height: 5' (152.4 cm) Weight: 71 kg (156 lb 8.4 oz) IBW/kg (Calculated) : 45.5 Heparin Dosing Weight: TBW  Vital Signs: Temp: 98.8 F (37.1 C) (08/21 0715) Temp Source: Core (08/21 0400) Pulse Rate: 70 (08/21 0715)  Labs: Recent Labs    10-01-20 0202 10-01-2020 0815 09/06/20 0516 09/06/20 1206 09/07/20 0332  HGB 9.7*   < > 8.3* 8.2* 8.1*  HCT 29.7*   < > 24.5* 25.0* 23.8*  PLT 143*   < > 110* PLATELET CLUMPS NOTED ON SMEAR, UNABLE TO ESTIMATE 100*  LABPROT 15.9*  --   --   --   --   INR 1.3*  --   --   --   --   HEPARINUNFRC  --    < > 0.23* 0.27* 0.19*  CREATININE 2.15*   < > 2.37* 2.31* 2.26*   < > = values in this interval not displayed.     Estimated Creatinine Clearance: 20.1 mL/min (A) (by C-G formula based on SCr of 2.26 mg/dL (H)).   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 (HCC) 08/28/2020   Uncontrolled type 2 diabetes mellitus with hyperglycemia, with long-term current use of insulin (HCC) 08/28/2020    Assessment: 70 yo F in cardiogenic shock, now with IABP placed and a fib on amio gtt.   IABP continues at 1:1.   Heparin level still below goal on 1100 units/hr. Hgb has been trending down, stable on recheck at 8.1 this morning. Platelets 100. No bleeding issues noted.   Goal of Therapy:  Heparin level 0.3-0.5 units/ml (IABP with a fib) Monitor platelets by anticoagulation protocol: Yes   Plan:  Increase heparin to 1250 units/hr Daily heparin level and CBC.  Sheppard Coil PharmD., BCPS Clinical Pharmacist 09/07/2020 7:24 AM

## 2020-09-07 NOTE — Progress Notes (Signed)
Patient ID: Erica Duncan, female   DOB: Mar 15, 1950, 70 y.o.   MRN: [id number]    Advanced Heart Failure Rounding Note   Subjective:    8/11 Admitted with abdominal pain -> developed shock, AKI and shock liver. EF 15%. Developed AF 8/15 AHF team consulted moved to IC. Started milrinone and NE 8/17:  Diuresed with IV lasix.  Hypotensive with increased pressor requirements with Norepi up to 55 mcg. Given 250 cc NS bolus. Milrinone cut back to 0.125 mcg. Imrproved with NE back down to 30 mcg. Hgb down 9.9. Sent for stat CT abd --negative for bleed. Heparin stopped.  8/17 VT arrest -> CPR & defib x 1 8/20 IABP placed  Today patient is on milrinone 0.375, NE 22, vasopressin 0.01. IABP 1:1, functioning appropriately.  I/Os even +500, good UOP with Lasix 40 mg IV bid yesterday but getting a lot in.  Creatinine 2.25 => 2.37 => 2.26.   She is in NSR today on amiodarone gtt 60 + heparin gtt.   Plts mildly low at 100 (have been mildly decreased).   PCT 1.7 => 4.29, on empiric vancomycin/cefepime with negative cultures.  Tm 99, WBCs 12.9.  Cultures NGTD.   Awake/alert, says she has "pain everywhere."   Swan numbers: CVP 16 PA 58/17 CI 2.4 SVR 1288 Co-ox 56%  LHC/RHC 8/19 on NE 30, mil 0.125, VP 0.03: Ao = 91/58 (71) LV = 90/23 RA = 15 RV = 47/16 PA = 60/28 (40) PCW = 32 (v = 40) Fick cardiac output/index = 2.9/1.7 PVR = 2.8 WU SVR = 1572 FA sat = 90% PA sat = 32%, 34% PAPI = 2.1 CPO = 0.45  Assessment: 1. Normal coronary arteries 2. Severe NICM 3. Cardiogenic shock  Objective:   Weight Range:  Vital Signs:   Temp:  [98.1 F (36.7 C)-99.1 F (37.3 C)] 98.8 F (37.1 C) (08/21 0745) Pulse Rate:  [46-135] 71 (08/21 0745) Resp:  [17-33] 25 (08/21 0745) SpO2:  [91 %-100 %] 97 % (08/21 0745) Weight:  [71 kg] 71 kg (08/21 0500) Last BM Date: 09/02/20  Weight change: Filed Weights   09/04/20 0500 09/06/20 0500 09/07/20 0500  Weight: 67.3 kg 64.5 kg 71 kg     Intake/Output:   Intake/Output Summary (Last 24 hours) at 09/07/2020 0750 Last data filed at 09/07/2020 0700 Gross per 24 hour  Intake 3069.57 ml  Output 2570 ml  Net 499.57 ml    Physical Exam: General: NAD Neck: JVP 14+ cm, no thyromegaly or thyroid nodule.  Lungs: Clear to auscultation bilaterally with normal respiratory effort. CV: Nondisplaced PMI.  Heart regular S1/S2, no S3/S4, no murmur.  2+ edema to knees.  Abdomen: Soft, nontender, no hepatosplenomegaly, no distention.  Skin: Intact without lesions or rashes.  Neurologic: Alert and oriented x 3.  Psych: Normal affect. Extremities: No clubbing or cyanosis. IABP present, site benign.  HEENT: Normal.    Telemetry: NSR 70s Personally reviewed   Labs: Basic Metabolic Panel: Recent Labs  Lab 08/31/20 0923 09/01/20 0337 09/01/20 0843 09/04/20 0428 09/04/20 1343 09/07/2020 0202 08/18/2020 0815 08/28/2020 1948 09/06/20 0048 09/06/20 0516 09/06/20 1206 09/07/20 0332  NA 139 136   < > 136   < > 132*   < > 128* 128* 129* 132* 132*  K 3.6 4.7   < > 3.1*   < > 3.7   < > 3.5 3.6 3.3* 3.9 2.9*  CL 108 107   < > 98   < > 100  --  96* 96* 97* 100 100  CO2 19* 10*   < > 26   < > 21*  --  21* 19* 20* 21* 22  GLUCOSE 119* 127*   < > 137*   < > 109*  --  129* 150* 170* 227* 102*  BUN 33* 47*   < > 56*   < > 48*  --  45* 45* 45* 44* 41*  CREATININE 1.81* 2.27*   < > 2.31*   < > 2.15*  --  2.29* 2.25* 2.37* 2.31* 2.26*  CALCIUM 8.5* 8.8*   < > 8.8*   < > 8.8*  --  8.2* 8.3* 8.3* 8.5* 8.5*  MG 1.8 1.8  --  1.5*  --  2.0  --   --   --   --   --  1.6*   < > = values in this interval not displayed.    Liver Function Tests: Recent Labs  Lab 09/02/20 0416 09/03/20 0303 09/04/20 0428 09/15/2020 0202 09/06/20 0516  AST 1,758* 632* 200* 106* 48*  ALT 857* 621* 363* 293* 180*  ALKPHOS 163* 157* 130* 140* 109  BILITOT 1.6* 1.9* 1.6* 1.3* 2.0*  PROT 5.2* 5.4* 5.3* 6.0* 5.5*  ALBUMIN 2.2* 2.3* 2.1* 2.3* 2.0*   No results for  input(s): LIPASE, AMYLASE in the last 168 hours. Recent Labs  Lab 09/01/20 1501 09/03/20 0313 09/08/2020 2126  AMMONIA 73* 28 26    CBC: Recent Labs  Lab 09/01/20 0337 09/01/20 0843 08/26/2020 0202 09/06/2020 0815 09/13/2020 0826 09/01/2020 2126 09/06/20 0516 09/06/20 1206 09/07/20 0332  WBC 9.6   < > 10.8*  --   --  13.4* 12.7* 12.5* 12.9*  NEUTROABS 8.0*  --   --   --   --   --   --   --   --   HGB 14.2   < > 9.7*   < > 11.2* 8.8* 8.3* 8.2* 8.1*  HCT 46.2*   < > 29.7*   < > 33.0* 26.2* 24.5* 25.0* 23.8*  MCV 73.1*   < > 67.5*  --   --  67.0* 66.0* 67.2* 66.3*  PLT 134*   < > 143*  --   --  140* 110* PLATELET CLUMPS NOTED ON SMEAR, UNABLE TO ESTIMATE 100*   < > = values in this interval not displayed.    Cardiac Enzymes: No results for input(s): CKTOTAL, CKMB, CKMBINDEX, TROPONINI in the last 168 hours.  BNP: BNP (last 3 results) No results for input(s): BNP in the last 8760 hours.  ProBNP (last 3 results) No results for input(s): PROBNP in the last 8760 hours.    Other results:  Imaging: CARDIAC CATHETERIZATION  Result Date: 09/07/2020 Findings: Done on NE 30, milrinone 0.125, VP 0.03 Ao = 91/58 (71) LV = 90/23 RA = 15 RV = 47/16 PA = 60/28 (40) PCW = 32 (v = 40) Fick cardiac output/index = 2.9/1.7 PVR = 2.8 WU SVR = 1572 FA sat = 90% PA sat = 32%, 34% PAPI = 2.1 CPO = 0.45 Assessment: 1. Normal coronary arteries 2. Severe NICM 3. Cardiogenic shock Plan/Discussion: IABP placed for support. Adjust inotropes and diurese. If limited response will need to consider axillary Impella as bridge to VAD if she is a candidate. Arvilla Meres, MD 9:23 AM  Port CXR  Result Date: 08/22/2020 CLINICAL DATA:  Central line placement.  Congestive heart failure. EXAM: PORTABLE CHEST 1 VIEW COMPARISON:  09/01/2020 FINDINGS: A new right jugular Swan-Ganz catheter  seen with tip overlying the right intralobar pulmonary artery. Left jugular central venous catheter is unchanged in position, with  tip overlying the proximal to mid right atrium. No evidence of pneumothorax. Cardiac enlargement is unchanged. Aortic atherosclerotic calcification noted. Increased diffuse bilateral pulmonary airspace disease is seen compared to prior exam. IMPRESSION: New right jugular Swan-Ganz catheter tip overlies the right intralobar pulmonary artery. No evidence of pneumothorax. Increased diffuse bilateral pulmonary airspace disease. Electronically Signed   By: Danae Orleans M.D.   On: October 04, 2020 13:59     Medications:     Scheduled Medications:  Chlorhexidine Gluconate Cloth  6 each Topical Daily   furosemide  40 mg Intravenous Once   insulin aspart  0-15 Units Subcutaneous TID WC   insulin aspart  0-5 Units Subcutaneous QHS   insulin aspart  4 Units Subcutaneous TID WC   insulin glargine-yfgn  20 Units Subcutaneous QHS   sodium chloride flush  10-40 mL Intracatheter Q12H   sodium chloride flush  3 mL Intravenous Once   sodium chloride flush  3 mL Intravenous Q12H   sodium chloride flush  3 mL Intravenous Q12H    Infusions:  sodium chloride     sodium chloride     sodium chloride Stopped (09/06/20 1945)   sodium chloride Stopped (10-04-2020 2156)   sodium chloride 10 mL/hr at 10/04/2020 1900   sodium chloride Stopped (09/07/20 0454)   amiodarone 60 mg/hr (09/07/20 0700)   ceFEPime (MAXIPIME) IV Stopped (09/06/20 1036)   epinephrine Stopped (09/06/20 1009)   furosemide (LASIX) 200 mg in dextrose 5% 100 mL (2mg /mL) infusion     heparin 1,100 Units/hr (09/07/20 0700)   levETIRAcetam Stopped (09/07/20 0354)   magnesium sulfate bolus IVPB 2 g (09/07/20 0725)   milrinone 0.375 mcg/kg/min (09/07/20 0700)   norepinephrine (LEVOPHED) Adult infusion 22 mcg/min (09/07/20 0700)   potassium chloride 10 mEq (09/07/20 0702)   sodium chloride     vancomycin Stopped (09/06/20 1206)   vasopressin 0.01 Units/min (09/07/20 0700)    PRN Medications: sodium chloride, Place/Maintain arterial line **AND** sodium  chloride, sodium chloride, sodium chloride, acetaminophen, LORazepam, ondansetron (ZOFRAN) IV, polyethylene glycol, sodium chloride, sodium chloride flush, sodium chloride flush, traMADol   Assessment/Plan :   1. Acute systolic HF -> cardiogenic shock - New diagnosis this admit, nonischemic cardiomyopathy with no CAD on 8/19 cath.  - EF ~ 20-25%, MR, TR on echo - Repeat echo 9/18 25-30% - Initial lactate 4.8 -> 1.5. Co-ox 41% on 8/15 - Based on high procalcitonin, suspect mixed cardiogenic/septic shock.  - Persistent shock despite inotropes/pressors, IABP placed 8/19.  Now with IABP 1:1, milrinone 0.375, NE 22, vasopressin 0.01.  Co-ox 56% now with CI improved now that she is on IABP (2.4).  Continue slow wean of pressors.  - No GDMT yet due to shock - UOP improved, I/Os mildly positive.  CVP up to 16 today.  Lasix 40 mg IV x 1 then Lasix gtt 10 mg/hr.   - Aggressive replacement of K and Mg, repeat BMET in pm.  - Place Unna boots.   - Age prohibits transplant. If doesn't begin to stabilize will need to consider whether she is candidate for Impella 5.5 bridge to recovery or possible durable VAD.   2. VT arrest on 8/17 - in setting of hypokalemia - Continue amiodarone gtt  - Keep K > 4.0 Mg > 2.0  3. Atrial fibrillation/flutter with RVR: - Was in NSR on admit. Noted 08/14. In/out AF on 8/15 - Back  in NSR on 8/21.  - Continue heparin gtt for now with AF/AFL and IABP.  Hgb trending down to 8.1 so follow closely.  - Continue amiodarone gtt, can decrease to 30 mg/hr.  - ECG today.   4. AKI on CKD, stage IIIb - Likely due to ATN in setting of #1 - Cr 1.74 > 2.27 -> 2.7 -> 2.7 -> 2.6->2.3-> 2.1 -> 2.37 -> 2.26.    5. Shock liver - LFTs have been trending down, daily CMET.    6. Insulin dependent DM II -SSI -Alc 7.9   7. Hypokalemia - Supplement today aggressively with repeat BMET in pm, discussed with pharmacy.    8. Hx CVA/seizure activity -Followed by Neurology -On Keppra -No  acute infarct on MRI brain and CT head  9. Microcytic anemia, iron-deficiency - iron sats low - given IV Iron 8/17 - CT abd negative for bleed.  - No obvious source of bleeding.  - Back on heparin gtt with hgb 8.1, continue to follow closely and transfuse hgb < 8.   10. Thrombocytopenia - Mild, relatively stable.  Most likely due to critical illness/IABP.   11. ID - On vancomycin/cefepime empirically with shock and PCT elevated at 1.7 => 4.29.  No definite source. Afebrile today with WBCs 12.9, cultures NGTD.  - CXR today.   CRITICAL CARE Performed by: Marca Ancona  Total critical care time: 45 minutes  Critical care time was exclusive of separately billable procedures and treating other patients.  Critical care was necessary to treat or prevent imminent or life-threatening deterioration.  Critical care was time spent personally by me (independent of midlevel providers or residents) on the following activities: development of treatment plan with patient and/or surrogate as well as nursing, discussions with consultants, evaluation of patient's response to treatment, examination of patient, obtaining history from patient or surrogate, ordering and performing treatments and interventions, ordering and review of laboratory studies, ordering and review of radiographic studies, pulse oximetry and re-evaluation of patient's condition.  Length of Stay: 10   Marca Ancona  MD 09/07/2020, 7:50 AM  Advanced Heart Failure Team Pager [phone] (M-F; 7a - 4p)  Please contact [facility] for night-coverage after hours (4p -7a ) and weekends on [email or web address]

## 2020-09-08 ENCOUNTER — Inpatient Hospital Stay (HOSPITAL_COMMUNITY): Payer: Medicare HMO

## 2020-09-08 LAB — BASIC METABOLIC PANEL
Anion gap: 13 (ref 5–15)
Anion gap: 14 (ref 5–15)
BUN: 41 mg/dL — ABNORMAL HIGH (ref 8–23)
BUN: 41 mg/dL — ABNORMAL HIGH (ref 8–23)
CO2: 23 mmol/L (ref 22–32)
CO2: 22 mmol/L (ref 22–32)
Calcium: 8.9 mg/dL (ref 8.9–10.3)
Calcium: 8.9 mg/dL (ref 8.9–10.3)
Chloride: 98 mmol/L (ref 98–111)
Chloride: 99 mmol/L (ref 98–111)
Creatinine, Ser: 2.45 mg/dL — ABNORMAL HIGH (ref 0.44–1.00)
Creatinine, Ser: 2.43 mg/dL — ABNORMAL HIGH (ref 0.44–1.00)
GFR, Estimated: 21 mL/min — ABNORMAL LOW (ref >60)
GFR, Estimated: 21 mL/min — ABNORMAL LOW (ref >60)
Glucose, Bld: 137 mg/dL — ABNORMAL HIGH (ref 70–99)
Glucose, Bld: 129 mg/dL — ABNORMAL HIGH (ref 70–99)
Potassium: 2.5 mmol/L — CL (ref 3.5–5.1)
Potassium: 3 mmol/L — ABNORMAL LOW (ref 3.5–5.1)
Sodium: 134 mmol/L — ABNORMAL LOW (ref 135–145)
Sodium: 135 mmol/L (ref 135–145)

## 2020-09-08 LAB — COOXEMETRY PANEL
Carboxyhemoglobin: 1.5 % (ref 0.5–1.5)
Carboxyhemoglobin: 1.5 % (ref 0.5–1.5)
Methemoglobin: 0.9 % (ref 0.0–1.5)
Methemoglobin: 0.9 % (ref 0.0–1.5)
O2 Saturation: 47.7 %
O2 Saturation: 44.1 %
Total hemoglobin: 11.2 g/dL — ABNORMAL LOW (ref 12.0–16.0)
Total hemoglobin: 8.7 g/dL — ABNORMAL LOW (ref 12.0–16.0)

## 2020-09-08 LAB — COMPREHENSIVE METABOLIC PANEL
ALT: 98 U/L — ABNORMAL HIGH (ref 0–44)
AST: 77 U/L — ABNORMAL HIGH (ref 15–41)
Albumin: 1.9 g/dL — ABNORMAL LOW (ref 3.5–5.0)
Alkaline Phosphatase: 98 U/L (ref 38–126)
Anion gap: 13 (ref 5–15)
BUN: 41 mg/dL — ABNORMAL HIGH (ref 8–23)
CO2: 21 mmol/L — ABNORMAL LOW (ref 22–32)
Calcium: 8.7 mg/dL — ABNORMAL LOW (ref 8.9–10.3)
Chloride: 99 mmol/L (ref 98–111)
Creatinine, Ser: 2.4 mg/dL — ABNORMAL HIGH (ref 0.44–1.00)
GFR, Estimated: 21 mL/min — ABNORMAL LOW (ref >60)
Glucose, Bld: 103 mg/dL — ABNORMAL HIGH (ref 70–99)
Potassium: 5 mmol/L (ref 3.5–5.1)
Sodium: 133 mmol/L — ABNORMAL LOW (ref 135–145)
Total Bilirubin: 3.1 mg/dL — ABNORMAL HIGH (ref 0.3–1.2)
Total Protein: 5.7 g/dL — ABNORMAL LOW (ref 6.5–8.1)

## 2020-09-08 LAB — GLUCOSE, CAPILLARY
Glucose-Capillary: 114 mg/dL — ABNORMAL HIGH (ref 70–99)
Glucose-Capillary: 109 mg/dL — ABNORMAL HIGH (ref 70–99)
Glucose-Capillary: 109 mg/dL — ABNORMAL HIGH (ref 70–99)
Glucose-Capillary: 152 mg/dL — ABNORMAL HIGH (ref 70–99)
Glucose-Capillary: 93 mg/dL (ref 70–99)

## 2020-09-08 LAB — CBC
HCT: 23.5 % — ABNORMAL LOW (ref 36.0–46.0)
Hemoglobin: 8 g/dL — ABNORMAL LOW (ref 12.0–15.0)
MCH: 22.3 pg — ABNORMAL LOW (ref 26.0–34.0)
MCHC: 34 g/dL (ref 30.0–36.0)
MCV: 65.5 fL — ABNORMAL LOW (ref 80.0–100.0)
Platelets: 90 10*3/uL — ABNORMAL LOW (ref 150–400)
RBC: 3.59 MIL/uL — ABNORMAL LOW (ref 3.87–5.11)
RDW: 17.7 % — ABNORMAL HIGH (ref 11.5–15.5)
WBC: 13.3 10*3/uL — ABNORMAL HIGH (ref 4.0–10.5)
nRBC: 0.8 % — ABNORMAL HIGH (ref 0.0–0.2)

## 2020-09-08 LAB — APTT
aPTT: 84 seconds — ABNORMAL HIGH (ref 24–36)
aPTT: 82 seconds — ABNORMAL HIGH (ref 24–36)

## 2020-09-08 LAB — CORTISOL: Cortisol, Plasma: 32.9 ug/dL

## 2020-09-08 LAB — SEDIMENTATION RATE: Sed Rate: 69 mm/hr — ABNORMAL HIGH (ref 0–22)

## 2020-09-08 LAB — HEPARIN LEVEL (UNFRACTIONATED): Heparin Unfractionated: 0.16 IU/mL — ABNORMAL LOW (ref 0.30–0.70)

## 2020-09-08 LAB — PREALBUMIN: Prealbumin: <5 mg/dL — ABNORMAL LOW (ref 18–38)

## 2020-09-08 LAB — MAGNESIUM: Magnesium: 1.9 mg/dL (ref 1.7–2.4)

## 2020-09-08 MED ORDER — SODIUM CHLORIDE 0.9 % IV SOLN
0.0250 mg/kg/h | INTRAVENOUS | Status: DC
  Administered 2020-09-08: 0.05 mg/kg/h via INTRAVENOUS
  Filled 2020-09-08: qty 250

## 2020-09-08 MED ORDER — POTASSIUM CHLORIDE 10 MEQ/50ML IV SOLN
10.0000 meq | INTRAVENOUS | Status: AC
  Administered 2020-09-08 – 2020-09-09 (×6): 10 meq via INTRAVENOUS
  Filled 2020-09-08 (×5): qty 50

## 2020-09-08 MED ORDER — DEXMEDETOMIDINE HCL IN NACL 400 MCG/100ML IV SOLN
0.2000 ug/kg/h | INTRAVENOUS | Status: DC
  Administered 2020-09-08: 0.2 ug/kg/h via INTRAVENOUS
  Filled 2020-09-08: qty 100

## 2020-09-08 MED ORDER — MAGNESIUM SULFATE 2 GM/50ML IV SOLN
2.0000 g | Freq: Once | INTRAVENOUS | Status: AC
  Administered 2020-09-08: 2 g via INTRAVENOUS
  Filled 2020-09-08: qty 50

## 2020-09-08 MED ORDER — CHLORHEXIDINE GLUCONATE 0.12 % MT SOLN
15.0000 mL | Freq: Two times a day (BID) | OROMUCOSAL | Status: DC
  Administered 2020-09-08: 15 mL via OROMUCOSAL

## 2020-09-08 MED ORDER — ORAL CARE MOUTH RINSE: 15.0000 mL | Freq: Two times a day (BID) | OROMUCOSAL | Status: DC

## 2020-09-08 MED ORDER — SODIUM CHLORIDE 0.9 % IV SOLN
1.0000 g | Freq: Two times a day (BID) | INTRAVENOUS | Status: DC
  Administered 2020-09-08 – 2020-09-09 (×3): 1 g via INTRAVENOUS
  Filled 2020-09-08 (×4): qty 1

## 2020-09-08 MED ORDER — SENNA 8.6 MG PO TABS
2.0000 | ORAL_TABLET | Freq: Every day | ORAL | Status: DC
  Administered 2020-09-08: 17.2 mg via ORAL
  Filled 2020-09-08: qty 2

## 2020-09-08 NOTE — Progress Notes (Signed)
Initial Nutrition Assessment  DOCUMENTATION CODES:   Not applicable  INTERVENTION:   If aggressive nutrition intervention warranted based on poc, recommend Cortrak placement with initiation of TF  Tube Feeding Recommendations: Vital 1.5 at 50 ml/hr Pro-Source TF 45 mL BID Provides 1880 kcals, 103 g of protein and 912 mL of free water   NUTRITION DIAGNOSIS:   Inadequate oral intake related to acute illness as evidenced by meal completion < 25%, NPO status.   GOAL:   Patient will meet greater than or equal to 90% of their needs   MONITOR:   PO intake, Supplement acceptance, Labs, Weight trends, Diet advancement  REASON FOR ASSESSMENT:   Consult Assessment of nutrition requirement/status  ASSESSMENT:   70 yo female admitted with acute CHF EF 15%, AKI on CKD; PMH includes DM, HTN, CKD  8/17 VT arrest 8/20 IABP placed  IABP 1:1, on levophed , vasopressin and and milrinone Noted possibility of Impella 5.5 bridge to recovery vs possible VAD  Currently on BiPap, requiring precedex due to agitation Pt with poor po intake since admission; recorded po intake 0-25% of meals Currently NPO for possible Impella  Pre-Albumin <5.0 which is a poor prognostic indicator  Current wt 68.8 kg  No BM since 8/16. Recommend increasing bowel regimen  Labs: sodium 133 (L), Creatinine 2.40, BUN 31 Meds: lasix gtt, ss novolog, novolog with meals, semglee, senokot    Diet Order:   Diet Order             Diet NPO time specified  Diet effective now                   EDUCATION NEEDS:   Not appropriate for education at this time  Skin:  Skin Assessment: Reviewed RN Assessment  Last BM:  8/16  Height:   Ht Readings from Last 1 Encounters:  09/02/20 5' (1.524 m)    Weight:   Wt Readings from Last 1 Encounters:  09/08/20 68.8 kg     BMI:  Body mass index is 29.62 kg/m.  Estimated Nutritional Needs:   Kcal:  1800-2000 kcals  Protein:  90-100 g  Fluid:   1.5 L  Romelle Starcher MS, RDN, LDN, CNSC Registered Dietitian III Clinical Nutrition RD Pager and On-Call Pager Number Located in [place]

## 2020-09-08 NOTE — Plan of Care (Addendum)
RN paged reporting patient with agitation, delirium, not compliant with BIPAP use. Mild tachy overnight, BP stable on milrinone and levophed and vasopressin, LFTs improving, discussed with Dr Shirlee Latch, [facility] start precedex gtt, advised RN start lowest dosing at 0.2 mcg/kg and monitor for bradycardia and hypotension

## 2020-09-08 NOTE — Progress Notes (Signed)
PT Cancellation Note  Patient Details Name: Erica Duncan MRN: [id number] DOB: 11-Jul-1950   Cancelled Treatment:    Reason Eval/Treat Not Completed: Patient not medically ready (pt with IABP on bipap and not currently appropriate)   Rain Wilhide B Taysean Wager 09/08/2020, 7:32 AM Merryl Hacker, PT Acute Rehabilitation Services Pager: [phone] Office: [phone]

## 2020-09-08 NOTE — Progress Notes (Addendum)
Patient ID: Erica Duncan, female   DOB: December 28, 1950, 70 y.o.   MRN: [id number]    Advanced Heart Failure Rounding Note   Subjective:    8/11 Admitted with abdominal pain -> developed shock, AKI and shock liver. EF 15%. Developed AF 8/15 AHF team consulted moved to IC. Started milrinone and NE 8/17:  Diuresed with IV lasix.  Hypotensive with increased pressor requirements with Norepi up to 55 mcg. Given 250 cc NS bolus. Milrinone cut back to 0.125 mcg. Imrproved with NE back down to 30 mcg. Hgb down 9.9. Sent for stat CT abd --negative for bleed. Heparin stopped.  8/17 VT arrest -> CPR & defib x 1 8/20 IABP placed 8/21 Unable to wean vasopressin and amio. In and out of A fib. Placed on lasix drip.  8/22 Increased oxygen demands placed on Bipap and started on precedex due to agitation.   Remains on  milrinone 0.375, NE 27, vasopressin 0.01. IABP 1:1. Diuresing with lasix drip. Making ~ 100 cc per hour.   Creatinine 2.25 => 2.37 => 2.26=> 2.4   In and out of A fib. Remains on amiodarone gtt 60 + heparin gtt.   Plts continue to tend down 140-->110>100>90.    PCT 1.7 => 4.29, on empiric vancomycin/cefepime with negative cultures.  A febrile. WBCs 13.3 .  Cultures NGTD.   Swan numbers: CVP 14 PA 57/22  Papi 4.07  CO 3.2  CI 2 SVR 1694  CPO 0.5  Co-ox 44%  LHC/RHC 8/19 on NE 30, mil 0.125, VP 0.03: Ao = 91/58 (71) LV = 90/23 RA = 15 RV = 47/16 PA = 60/28 (40) PCW = 32 (v = 40) Fick cardiac output/index = 2.9/1.7 PVR = 2.8 WU SVR = 1572 FA sat = 90% PA sat = 32%, 34% PAPI = 2.1 CPO = 0.45  Assessment: 1. Normal coronary arteries 2. Severe NICM 3. Cardiogenic shock  Objective:   Weight Range:  Vital Signs:   Temp:  [98.1 F (36.7 C)-99.5 F (37.5 C)] 98.1 F (36.7 C) (08/22 0738) Pulse Rate:  [45-134] 110 (08/22 0738) Resp:  [14-40] 25 (08/22 0738) BP: (124-135)/(69-122) 125/69 (08/22 0700) SpO2:  [84 %-100 %] 100 % (08/22 0738) FiO2 (%):  [50 %]  50 % (08/22 0547) Weight:  [68.8 kg] 68.8 kg (08/22 0500) Last BM Date: 09/02/20  Weight change: Filed Weights   09/06/20 0500 09/07/20 0500 09/08/20 0500  Weight: 64.5 kg 71 kg 68.8 kg    Intake/Output:   Intake/Output Summary (Last 24 hours) at 09/08/2020 0752 Last data filed at 09/08/2020 0700 Gross per 24 hour  Intake 3103.37 ml  Output 2750 ml  Net 353.37 ml  CVP 14   Physical Exam: General:   Appears weak. On bipap  HEENT: normal Neck: supple. Difficult to assess JVP with lines . Carotids 2+ bilat; no bruits. No lymphadenopathy or thryomegaly appreciated. RIJ swan LIJ central ine.  Cor: PMI nondisplaced. Irregular rate & rhythm. No rubs, gallops or murmurs. Lungs: Clear on bipap Abdomen: soft, nontender, nondistended. No hepatosplenomegaly. No bruits or masses. Good bowel sounds. Extremities: no cyanosis, clubbing, rash, edema. R femoral IABP Neuro: Drowsy. Opens eyes. MAE.  GU: Foley - yellow urine  Telemetry: In and out of A fib/SR.   Labs: Basic Metabolic Panel: Recent Labs  Lab 09/04/20 0428 09/04/20 1343 09/11/2020 0202 09/17/2020 0815 09/06/20 0516 09/06/20 1206 09/07/20 0332 09/07/20 1232 09/08/20 0349  NA 136   < > 132*   < > 129*  132* 132* 134* 133*  K 3.1*   < > 3.7   < > 3.3* 3.9 2.9* 3.2* 5.0  CL 98   < > 100   < > 97* 100 100 101 99  CO2 26   < > 21*   < > 20* 21* 22 23 21*  GLUCOSE 137*   < > 109*   < > 170* 227* 102* 78 103*  BUN 56*   < > 48*   < > 45* 44* 41* 40* 41*  CREATININE 2.31*   < > 2.15*   < > 2.37* 2.31* 2.26* 2.26* 2.40*  CALCIUM 8.8*   < > 8.8*   < > 8.3* 8.5* 8.5* 8.6* 8.7*  MG 1.5*  --  2.0  --   --   --  1.6* 2.0 1.9   < > = values in this interval not displayed.    Liver Function Tests: Recent Labs  Lab 09/03/20 0303 09/04/20 0428 09/15/2020 0202 09/06/20 0516 09/08/20 0349  AST 632* 200* 106* 48* 77*  ALT 621* 363* 293* 180* 98*  ALKPHOS 157* 130* 140* 109 98  BILITOT 1.9* 1.6* 1.3* 2.0* 3.1*  PROT 5.4* 5.3* 6.0*  5.5* 5.7*  ALBUMIN 2.3* 2.1* 2.3* 2.0* 1.9*   No results for input(s): LIPASE, AMYLASE in the last 168 hours. Recent Labs  Lab 09/01/20 1501 09/03/20 0313 08/18/2020 2126  AMMONIA 73* 28 26    CBC: Recent Labs  Lab 09/01/2020 2126 09/06/20 0516 09/06/20 1206 09/07/20 0332 09/08/20 0349  WBC 13.4* 12.7* 12.5* 12.9* 13.3*  HGB 8.8* 8.3* 8.2* 8.1* 8.0*  HCT 26.2* 24.5* 25.0* 23.8* 23.5*  MCV 67.0* 66.0* 67.2* 66.3* 65.5*  PLT 140* 110* PLATELET CLUMPS NOTED ON SMEAR, UNABLE TO ESTIMATE 100* 90*    Cardiac Enzymes: No results for input(s): CKTOTAL, CKMB, CKMBINDEX, TROPONINI in the last 168 hours.  BNP: BNP (last 3 results) No results for input(s): BNP in the last 8760 hours.  ProBNP (last 3 results) No results for input(s): PROBNP in the last 8760 hours.    Other results:  Imaging: DG CHEST PORT 1 VIEW  Result Date: 09/07/2020 CLINICAL DATA:  Follow-up for congestive heart failure. EXAM: PORTABLE CHEST 1 VIEW COMPARISON:  08/27/2020 and older studies. FINDINGS: Right greater than left airspace lung opacities are without significant change from the prior study. Probable small effusions.  No pneumothorax. Left internal jugular central venous line and right internal jugular Swan-Ganz catheter, as well as inter aortic balloon pump, are stable from the prior exam. IMPRESSION: 1. No significant change from the most recent prior chest radiograph. 2. Stable bilateral pulmonary airspace opacities consistent with edema. 3. No change in support apparatus. Electronically Signed   By: Amie Portland M.D.   On: 09/07/2020 12:09     Medications:     Scheduled Medications:  Chlorhexidine Gluconate Cloth  6 each Topical Daily   insulin aspart  0-15 Units Subcutaneous TID WC   insulin aspart  0-5 Units Subcutaneous QHS   insulin aspart  4 Units Subcutaneous TID WC   insulin glargine-yfgn  20 Units Subcutaneous QHS   senna  2 tablet Oral Daily   sodium chloride flush  10-40 mL  Intracatheter Q12H   sodium chloride flush  3 mL Intravenous Once   sodium chloride flush  3 mL Intravenous Q12H   sodium chloride flush  3 mL Intravenous Q12H    Infusions:  sodium chloride     sodium chloride  sodium chloride Stopped (09/07/20 2235)   sodium chloride Stopped (09/15/2020 2156)   sodium chloride 10 mL/hr at 08/18/2020 1900   sodium chloride 10 mL/hr at 09/08/20 0700   amiodarone 60 mg/hr (09/08/20 0700)   ceFEPime (MAXIPIME) IV Stopped (09/07/20 0954)   dexmedetomidine (PRECEDEX) IV infusion 0.2 mcg/kg/hr (09/08/20 0653)   epinephrine Stopped (09/06/20 1009)   furosemide (LASIX) 200 mg in dextrose 5% 100 mL (2mg /mL) infusion 10 mg/hr (09/08/20 0700)   heparin 1,400 Units/hr (09/08/20 0700)   levETIRAcetam Stopped (09/08/20 0419)   milrinone 0.375 mcg/kg/min (09/08/20 0700)   norepinephrine (LEVOPHED) Adult infusion 27 mcg/min (09/08/20 0700)   sodium chloride     vancomycin Stopped (09/06/20 1206)   vasopressin 0.01 Units/min (09/08/20 0700)    PRN Medications: sodium chloride, Place/Maintain arterial line **AND** sodium chloride, sodium chloride, sodium chloride, acetaminophen, LORazepam, ondansetron (ZOFRAN) IV, polyethylene glycol, sodium chloride, sodium chloride flush, sodium chloride flush, traMADol, zolpidem   Assessment/Plan :   1. Acute systolic HF -> cardiogenic shock - New diagnosis this admit, nonischemic cardiomyopathy with no CAD on 8/19 cath.  - EF ~ 20-25%, MR, TR on echo - Repeat echo 9/18 25-30% - Initial lactate 4.8 -> 1.5. Co-ox 41% on 8/15 - Based on high procalcitonin, suspect mixed cardiogenic/septic shock.  - Persistent shock despite inotropes/pressors, IABP placed 8/19.   - Now with IABP 1:1, milrinone 0.375, NE 27, vasopressin 0.01.  Co-ox 44% now with CI 2  - No GDMT yet due to shock - CVP 14. Continue lasix drip 10 mg per hour.  -K stable.  - Age prohibits transplant. If doesn't begin to stabilize will need to consider whether  she is candidate for Impella 5.5 bridge to recovery or possible durable VAD.   2. VT arrest on 8/17 - in setting of hypokalemia - Continue amiodarone gtt  - Keep K > 4.0 Mg > 2.0 -K stable.  - Give 2 grams Mag now.   3. Atrial fibrillation/flutter with RVR: - Was in NSR on admit. Noted 08/14. In/out AF on 8/15 - She has been in and out of A fib.   - Continue heparin gtt for now with AF/AFL and IABP.  Hgb trending down to 8. so follow closely.  - Continue amiodarone gtt 60 mg per hour.    4. AKI on CKD, stage IIIb - Likely due to ATN in setting of #1 - Cr trending up 2.37 -> 2.26->2.4 .    5. Shock liver - LFTs have been trending down, daily CMET.    6. Insulin dependent DM II -SSI -Alc 7.9   7. Hypokalemia - Stable.     8. Hx CVA/seizure activity -Followed by Neurology -On Keppra -No acute infarct on MRI brain and CT head  9. Microcytic anemia, iron-deficiency - iron sats low - given IV Iron 8/17 - CT abd negative for bleed.  - No obvious source of bleeding.  - Back on heparin gtt with hgb 8., continue to follow closely and transfuse hgb < 8.   10. Thrombocytopenia - Platelets trending down 110>100>90. If platelets down further in am will need HIT - No obvious source.   11. ID - On vancomycin/cefepime empirically with shock and PCT elevated at 1.7 => 4.29.  No definite source. Afebrile today with WBCs 13.3 , cultures NGTD.   12. Acute Hypoxemic Respiratory Failure - Increased oxygen requirements 2>6>50% Bipap.   13. Protein Malnutrition  -Poor po intake 48-72 hours.  - Albumin 1.9  - Check preabumin -  Consult dietitian.   NPO - for possible Impella later today.  Length of Stay: 11   Amy Clegg  NP-C  09/08/2020, 7:52 AM  Advanced Heart Failure Team Pager [phone] (M-F; 7a - 4p)  Please contact [facility] for night-coverage after hours (4p -7a ) and weekends on [email or web address]  Agree with above.   She has worsening shock despite multiple pressors and  IABP support. Agitated overnight with respiratory distress. Now on Bipap. Sedated  on Precedex. Only moans to stimulation. PCT up  General:  Critically ill appearing.  On bipap  HEENT: normal Neck: supple. RIJ swan Carotids 2+ bilat; no bruits. No lymphadenopathy or thryomegaly appreciated. Cor: PMI nondisplaced. Irregular rate & rhythm. No rubs, gallops or murmurs. Lungs: coarse Abdomen: soft, nontender, nondistended. No hepatosplenomegaly. No bruits or masses. Good bowel sounds. Extremities: no cyanosis, clubbing, rash, 2+ edema RFA IABP ok  Neuro: sedated. Arouses minimally  She continues to deteriorate despite high-level support. Unsure of etiology of cardiomyopathy. Also seems to have a component of sepsis. She had recent travel to [place]   Long talk with her daughter, I think only option for survival would be to consider Impella 5.5 as a bridge to durable VAD if she improves.   Her daughter does not think she would want this. Will try to reduce sedation and speak with Ms. Erica Duncan directly.   For now continue current support. Switch to bival. Expand antibiotics to cover atypicals. Will check serologies. Doubt Chagas but it is in the differential.   CRITICAL CARE Performed by: Arvilla Meres  Total critical care time: 45 minutes  Critical care time was exclusive of separately billable procedures and treating other patients.  Critical care was necessary to treat or prevent imminent or life-threatening deterioration.  Critical care was time spent personally by me (independent of midlevel providers or residents) on the following activities: development of treatment plan with patient and/or surrogate as well as nursing, discussions with consultants, evaluation of patient's response to treatment, examination of patient, obtaining history from patient or surrogate, ordering and performing treatments and interventions, ordering and review of laboratory studies, ordering and review  of radiographic studies, pulse oximetry and re-evaluation of patient's condition.  Arvilla Meres, MD  9:49 AM

## 2020-09-08 NOTE — Progress Notes (Signed)
RT note- Patient placed on heated high flow to enable relief from Bipap facemask. Patient is tolerating well at this time. Continue to monitor.

## 2020-09-08 NOTE — Plan of Care (Signed)
  Problem: Coping: Goal: Ability to adjust to condition or change in health will improve Outcome: Progressing Goal: Ability to identify appropriate support needs will improve Outcome: Progressing   Problem: Medication: Goal: Risk for medication side effects will decrease Outcome: Progressing   Problem: Clinical Measurements: Goal: Diagnostic test results will improve Outcome: Progressing   Problem: Self-Concept: Goal: Ability to verbalize feelings about condition will improve Outcome: Progressing   Problem: Clinical Measurements: Goal: Ability to maintain clinical measurements within normal limits will improve Outcome: Progressing Goal: Respiratory complications will improve Outcome: Progressing   Problem: Coping: Goal: Level of anxiety will decrease Outcome: Progressing   Problem: Pain Managment: Goal: General experience of comfort will improve Outcome: Progressing   Problem: Safety: Goal: Ability to remain free from injury will improve Outcome: Progressing   Problem: Safety: Goal: Verbalization of understanding the information provided will improve Outcome: Not Progressing   Problem: Activity: Goal: Risk for activity intolerance will decrease Outcome: Not Progressing   Problem: Nutrition: Goal: Adequate nutrition will be maintained Outcome: Not Progressing   Problem: Education: Goal: Knowledge of secondary prevention will improve Outcome: Not Progressing Goal: Knowledge of patient specific risk factors addressed and post discharge goals established will improve Outcome: Not Progressing   Problem: Health Behavior/Discharge Planning: Goal: Compliance with prescribed medication regimen will improve Outcome: Not Applicable

## 2020-09-08 NOTE — Progress Notes (Signed)
ANTICOAGULATION CONSULT NOTE  Pharmacy Consult for bivalrudin Indication: atrial fibrillation and IABP  No Known Allergies  Patient Measurements: Height: 5' (152.4 cm) Weight: 68.8 kg (151 lb 10.8 oz) IBW/kg (Calculated) : 45.5 Dosing Weight: 68.8kg  Vital Signs: Temp: 97 F (36.1 C) (08/22 1430) Temp Source: Core (08/22 0400) BP: 140/80 (08/22 1400) Pulse Rate: 65 (08/22 1430)  Labs: Recent Labs    09/06/20 1206 09/07/20 0332 09/07/20 1232 09/08/20 0349 09/08/20 1329  HGB 8.2* 8.1*  --  8.0*  --   HCT 25.0* 23.8*  --  23.5*  --   PLT PLATELET CLUMPS NOTED ON SMEAR, UNABLE TO ESTIMATE 100*  --  90*  --   APTT  --   --   --   --  84*  HEPARINUNFRC 0.27* 0.19* 0.20* 0.16*  --   CREATININE 2.31* 2.26* 2.26* 2.40*  --      Estimated Creatinine Clearance: 18.6 mL/min (A) (by C-G formula based on SCr of 2.4 mg/dL (H)).   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 (HCC) 08/28/2020   Uncontrolled type 2 diabetes mellitus with hyperglycemia, with long-term current use of insulin (HCC) 08/28/2020    Medications:  Infusions:   sodium chloride     sodium chloride     sodium chloride Stopped (09/07/20 2235)   sodium chloride Stopped (09/15/2020 2156)   sodium chloride 10 mL/hr at 09/13/2020 1900   sodium chloride 10 mL/hr at 09/08/20 1400   amiodarone 60 mg/hr (09/08/20 1400)   bivalirudin (ANGIOMAX) infusion 0.5 mg/mL (Non-ACS indications) 0.05 mg/kg/hr (09/08/20 1400)   dexmedetomidine (PRECEDEX) IV infusion 0.2 mcg/kg/hr (09/08/20 0653)   epinephrine Stopped (09/06/20 1009)   furosemide (LASIX) 200 mg in dextrose 5% 100 mL (2mg /mL) infusion 10 mg/hr (09/08/20 1400)   levETIRAcetam Stopped (09/08/20 0419)   meropenem (MERREM) IV Stopped (09/08/20 1116)   milrinone 0.375 mcg/kg/min (09/08/20 1400)    norepinephrine (LEVOPHED) Adult infusion 25 mcg/min (09/08/20 1400)   sodium chloride     vancomycin Stopped (09/08/20 1227)   vasopressin 0.01 Units/min (09/08/20 1400)   Assessment: 70 yo F in cardiogenic shock, now with IABP placed and a fib on amio gtt.  Patient with down-trending platelets. Baseline 140-150, now at 90 concerning for potential HIT. Heparin changed to bivalrudin. SRA labs ordered. 4 T's score of 3.   Initial aPTT came back elevated at 84, on bivalirudin@0 .05 mg/kg/hr. No s/sx of bleeding or infusion issues.   Goal of Therapy:  aPTT 50-65 seconds Monitor platelets by anticoagulation protocol: Yes   Plan:  Reduce bivalrudin at 0.04 mg/kg Check aPTT in 4 hours  Continue to monitor H&H and platelets  62, PharmD, BCCCP Clinical Pharmacist  Phone: [phone] 09/08/2020 2:52 PM  Please check AMION for all [facility] phone numbers After 10:00 PM, call Main Pharmacy [phone]

## 2020-09-08 NOTE — Progress Notes (Signed)
ANTICOAGULATION CONSULT NOTE - Initial Consult  Pharmacy Consult for bivalrudin Indication: atrial fibrillation and IABP  No Known Allergies  Patient Measurements: Height: 5' (152.4 cm) Weight: 68.8 kg (151 lb 10.8 oz) IBW/kg (Calculated) : 45.5 Dosing Weight: 68.8kg  Vital Signs: Temp: 97.3 F (36.3 C) (08/22 0915) Temp Source: Core (08/22 0400) BP: 130/88 (08/22 0900) Pulse Rate: 66 (08/22 0915)  Labs: Recent Labs    09/06/20 1206 09/07/20 0332 09/07/20 1232 09/08/20 0349  HGB 8.2* 8.1*  --  8.0*  HCT 25.0* 23.8*  --  23.5*  PLT PLATELET CLUMPS NOTED ON SMEAR, UNABLE TO ESTIMATE 100*  --  90*  HEPARINUNFRC 0.27* 0.19* 0.20* 0.16*  CREATININE 2.31* 2.26* 2.26* 2.40*    Estimated Creatinine Clearance: 18.6 mL/min (A) (by C-G formula based on SCr of 2.4 mg/dL (H)).   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 (HCC) 08/28/2020   Uncontrolled type 2 diabetes mellitus with hyperglycemia, with long-term current use of insulin (HCC) 08/28/2020    Medications:  Infusions:   sodium chloride     sodium chloride     sodium chloride Stopped (09/07/20 2235)   sodium chloride Stopped (08/23/2020 2156)   sodium chloride 10 mL/hr at 08/25/2020 1900   sodium chloride Stopped (09/08/20 0830)   amiodarone 60 mg/hr (09/08/20 0900)   bivalirudin (ANGIOMAX) infusion 0.5 mg/mL (Non-ACS indications)     ceFEPime (MAXIPIME) IV Stopped (09/07/20 0954)   dexmedetomidine (PRECEDEX) IV infusion 0.2 mcg/kg/hr (09/08/20 0653)   epinephrine Stopped (09/06/20 1009)   furosemide (LASIX) 200 mg in dextrose 5% 100 mL (2mg /mL) infusion 10 mg/hr (09/08/20 0900)   levETIRAcetam Stopped (09/08/20 0419)   magnesium sulfate bolus IVPB 50 mL/hr at 09/08/20 0900   milrinone 0.375 mcg/kg/min (09/08/20 0900)   norepinephrine (LEVOPHED)  Adult infusion 27 mcg/min (09/08/20 0900)   sodium chloride     vancomycin Stopped (09/06/20 1206)   vasopressin 0.01 Units/min (09/08/20 0900)   Assessment: 70 yo F in cardiogenic shock, now with IABP placed and a fib on amio gtt.  Patient with down-trending platelets. Baseline 140-150, now at 90 concerning for potential HIT. Heparin changed to bivalrudin. SRA labs ordered. 4 T's score of 3.   Goal of Therapy:  aPTT 50-65 seconds Monitor platelets by anticoagulation protocol: Yes   Plan:  Start bivalrudin at 0.05 mg/kg Check aPTT in 4 hours (1330) Continue to monitor H&H and platelets  62 A Bryson Palen 09/08/2020,9:28 AM

## 2020-09-08 NOTE — Progress Notes (Signed)
Pharmacy Antibiotic Note  Erica Duncan is a 70 y.o. female admitted on 09/08/2020 with heart failure exacerbation. Started on abx for sepsis.  Has been on antibiotics since 8/18 - abx escalated to meropenem today given continued high pressor demand, WBC 13, PCT elevated at 4.29 (trending upward). Afebrile.   Plan: Vancomycin 1g IV every 48 hours Meropenem 1g IV every 12 hours  Follow renal function closely and narrow antibiotics as able  Height: 5' (152.4 cm) Weight: 68.8 kg (151 lb 10.8 oz) IBW/kg (Calculated) : 45.5  Temp (24hrs), Avg:98.6 F (37 C), Min:96.6 F (35.9 C), Max:99.5 F (37.5 C)  Recent Labs  Lab 09/01/20 1501 09/01/20 1744 09/01/20 2309 09/02/20 0416 09/03/20 0303 10-04-2020 2126 09/06/20 0048 09/06/20 0109 09/06/20 0516 09/06/20 1206 09/07/20 0332 09/07/20 1232 09/08/20 0349  WBC  --   --   --  9.7   < > 13.4*  --   --  12.7* 12.5* 12.9*  --  13.3*  CREATININE  --   --    < > 2.75*   < >  --    < >  --  2.37* 2.31* 2.26* 2.26* 2.40*  LATICACIDVEN 2.7* 2.3*  --  1.5  --  1.2  --  1.6  --   --   --   --   --    < > = values in this interval not displayed.     Estimated Creatinine Clearance: 18.6 mL/min (A) (by C-G formula based on SCr of 2.4 mg/dL (H)).    No Known Allergies   Thank you for allowing pharmacy to be a part of this patient's care.  Sherron Monday, PharmD, BCCCP Clinical Pharmacist  Phone: [phone] 09/08/2020 2:22 PM  Please check AMION for all [facility] phone numbers After 10:00 PM, call Main Pharmacy [phone]

## 2020-09-08 NOTE — Progress Notes (Signed)
Pt placed on Bipap due to increase WOB.  Pt tolerating well

## 2020-09-08 NOTE — Progress Notes (Signed)
ANTICOAGULATION CONSULT NOTE   Pharmacy Consult for heparin  Indication: atrial fibrillation and IABP  No Known Allergies  Patient Measurements: Height: 5' (152.4 cm) Weight: 71 kg (156 lb 8.4 oz) IBW/kg (Calculated) : 45.5 Heparin Dosing Weight: TBW  Vital Signs: Temp: 99 F (37.2 C) (08/22 0430) Temp Source: Core (08/22 0400) BP: 124/82 (08/22 0400) Pulse Rate: 128 (08/22 0430)  Labs: Recent Labs    09/06/20 1206 09/07/20 0332 09/07/20 1232 09/08/20 0349  HGB 8.2* 8.1*  --  8.0*  HCT 25.0* 23.8*  --  23.5*  PLT PLATELET CLUMPS NOTED ON SMEAR, UNABLE TO ESTIMATE 100*  --  90*  HEPARINUNFRC 0.27* 0.19* 0.20* 0.16*  CREATININE 2.31* 2.26* 2.26* 2.40*     Estimated Creatinine Clearance: 18.9 mL/min (A) (by C-G formula based on SCr of 2.4 mg/dL (H)).   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 (HCC) 08/28/2020   Uncontrolled type 2 diabetes mellitus with hyperglycemia, with long-term current use of insulin (HCC) 08/28/2020    Assessment: 70 yo F in cardiogenic shock, now with IABP placed and a fib on amio gtt.   Heparin level remains below goal (0.16) on 1250 units/hr. Hgb low but stable. Platelets down slightly to 90. No issues with line or bleeding reported per RN.  Goal of Therapy:  Heparin level 0.3-0.5 units/ml (IABP with a fib) Monitor platelets by anticoagulation protocol: Yes   Plan:  Increase heparin to 1400 units/hr F/u 8 hr heparin level  Christoper Fabian, PharmD, BCPS Please see amion for complete clinical pharmacist phone list 09/08/2020 5:07 AM

## 2020-09-08 NOTE — Progress Notes (Signed)
OT Cancellation Note  Patient Details Name: Erica Duncan MRN: [id number] DOB: 01/14/1951   Cancelled Treatment:    Reason Eval/Treat Not Completed: Patient not medically ready (pt with IABP on bipap and not currently appropriate).   Barry Brunner, OT Acute Rehabilitation Services Pager [phone] Office [phone]   Chancy Milroy 09/08/2020, 9:17 AM

## 2020-09-08 NOTE — Progress Notes (Signed)
ANTICOAGULATION CONSULT NOTE  Pharmacy Consult for bivalrudin Indication: atrial fibrillation and IABP  No Known Allergies  Patient Measurements: Height: 5' (152.4 cm) Weight: 68.8 kg (151 lb 10.8 oz) IBW/kg (Calculated) : 45.5 Dosing Weight: 68.8kg  Vital Signs: Temp: 97.7 F (36.5 C) (08/22 1915) BP: 105/65 (08/22 1900) Pulse Rate: 100 (08/22 1900)  Labs: Recent Labs    09/06/20 1206 09/07/20 0332 09/07/20 1232 09/08/20 0349 09/08/20 1329 09/08/20 1843  HGB 8.2* 8.1*  --  8.0*  --   --   HCT 25.0* 23.8*  --  23.5*  --   --   PLT PLATELET CLUMPS NOTED ON SMEAR, UNABLE TO ESTIMATE 100*  --  90*  --   --   APTT  --   --   --   --  84* 82*  HEPARINUNFRC 0.27* 0.19* 0.20* 0.16*  --   --   CREATININE 2.31* 2.26* 2.26* 2.40*  --   --      Estimated Creatinine Clearance: 18.6 mL/min (A) (by C-G formula based on SCr of 2.4 mg/dL (H)).   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 (HCC) 08/28/2020   Uncontrolled type 2 diabetes mellitus with hyperglycemia, with long-term current use of insulin (HCC) 08/28/2020    Medications:  Infusions:   sodium chloride     sodium chloride     sodium chloride Stopped (09/07/20 2235)   sodium chloride Stopped (08/23/2020 2156)   sodium chloride 10 mL/hr at 09/15/2020 1900   sodium chloride 10 mL/hr at 09/08/20 1900   amiodarone 60 mg/hr (09/08/20 1900)   bivalirudin (ANGIOMAX) infusion 0.5 mg/mL (Non-ACS indications) 0.04 mg/kg/hr (09/08/20 1900)   dexmedetomidine (PRECEDEX) IV infusion 0.2 mcg/kg/hr (09/08/20 0653)   epinephrine Stopped (09/06/20 1009)   furosemide (LASIX) 200 mg in dextrose 5% 100 mL (2mg /mL) infusion 10 mg/hr (09/08/20 1900)   levETIRAcetam Stopped (09/08/20 1620)   meropenem (MERREM) IV Stopped (09/08/20 1116)   milrinone 0.375 mcg/kg/min  (09/08/20 1900)   norepinephrine (LEVOPHED) Adult infusion 24 mcg/min (09/08/20 1900)   sodium chloride     vancomycin Stopped (09/08/20 1227)   vasopressin 0.01 Units/min (09/08/20 1900)   Assessment: 70 yo F in cardiogenic shock, now with IABP placed and a fib on amio gtt.  Patient with down-trending platelets. Baseline 140-150, now at 90 concerning for potential HIT. Heparin changed to bivalrudin. SRA labs ordered. 4 T's score of 3.   Follow up aPTT came back elevated again at 82s, on bivalirudin@0 .04 mg/kg/hr. No s/sx of bleeding or infusion issues.   Goal of Therapy:  aPTT 50-65 seconds Monitor platelets by anticoagulation protocol: Yes   Plan:  Reduce bivalrudin at 0.025 mg/kg Check aPTT in 4 hours  Continue to monitor H&H and platelets  62 PharmD., BCPS Clinical Pharmacist 09/08/2020 7:25 PM

## 2020-09-08 NOTE — Progress Notes (Signed)
Received critical value of potassium 2.5 from lab. Result is much lower than earlier. Will repeat with a stat BMP.

## 2020-09-09 LAB — TYPE AND SCREEN
ABO/RH(D): O POS
Antibody Screen: NEGATIVE

## 2020-09-09 LAB — CULTURE, BLOOD (ROUTINE X 2)
Culture: NO GROWTH
Culture: NO GROWTH
Special Requests: ADEQUATE
Special Requests: ADEQUATE

## 2020-09-09 LAB — COMPREHENSIVE METABOLIC PANEL
ALT: 72 U/L — ABNORMAL HIGH (ref 0–44)
AST: 36 U/L (ref 15–41)
Albumin: 1.7 g/dL — ABNORMAL LOW (ref 3.5–5.0)
Alkaline Phosphatase: 96 U/L (ref 38–126)
Anion gap: 13 (ref 5–15)
BUN: 43 mg/dL — ABNORMAL HIGH (ref 8–23)
CO2: 20 mmol/L — ABNORMAL LOW (ref 22–32)
Calcium: 8.8 mg/dL — ABNORMAL LOW (ref 8.9–10.3)
Chloride: 101 mmol/L (ref 98–111)
Creatinine, Ser: 2.47 mg/dL — ABNORMAL HIGH (ref 0.44–1.00)
GFR, Estimated: 20 mL/min — ABNORMAL LOW (ref >60)
Glucose, Bld: 144 mg/dL — ABNORMAL HIGH (ref 70–99)
Potassium: 3.2 mmol/L — ABNORMAL LOW (ref 3.5–5.1)
Sodium: 134 mmol/L — ABNORMAL LOW (ref 135–145)
Total Bilirubin: 2.5 mg/dL — ABNORMAL HIGH (ref 0.3–1.2)
Total Protein: 5.6 g/dL — ABNORMAL LOW (ref 6.5–8.1)

## 2020-09-09 LAB — POCT I-STAT 7, (LYTES, BLD GAS, ICA,H+H)
Acid-base deficit: 3 mmol/L — ABNORMAL HIGH (ref 0.0–2.0)
Bicarbonate: 21.7 mmol/L (ref 20.0–28.0)
Calcium, Ion: 1.26 mmol/L (ref 1.15–1.40)
HCT: 26 % — ABNORMAL LOW (ref 36.0–46.0)
Hemoglobin: 8.8 g/dL — ABNORMAL LOW (ref 12.0–15.0)
O2 Saturation: 90 %
Patient temperature: 36.8
Potassium: 3 mmol/L — ABNORMAL LOW (ref 3.5–5.1)
Sodium: 135 mmol/L (ref 135–145)
TCO2: 23 mmol/L (ref 22–32)
pCO2 arterial: 34.6 mmHg (ref 32.0–48.0)
pH, Arterial: 7.404 (ref 7.350–7.450)
pO2, Arterial: 56 mmHg — ABNORMAL LOW (ref 83.0–108.0)

## 2020-09-09 LAB — GLUCOSE, CAPILLARY
Glucose-Capillary: 111 mg/dL — ABNORMAL HIGH (ref 70–99)
Glucose-Capillary: 62 mg/dL — ABNORMAL LOW (ref 70–99)
Glucose-Capillary: 178 mg/dL — ABNORMAL HIGH (ref 70–99)

## 2020-09-09 LAB — CBC
HCT: 22.3 % — ABNORMAL LOW (ref 36.0–46.0)
Hemoglobin: 7.9 g/dL — ABNORMAL LOW (ref 12.0–15.0)
MCH: 22.4 pg — ABNORMAL LOW (ref 26.0–34.0)
MCHC: 35.4 g/dL (ref 30.0–36.0)
MCV: 63.4 fL — ABNORMAL LOW (ref 80.0–100.0)
Platelets: 91 10*3/uL — ABNORMAL LOW (ref 150–400)
RBC: 3.52 MIL/uL — ABNORMAL LOW (ref 3.87–5.11)
RDW: 18.3 % — ABNORMAL HIGH (ref 11.5–15.5)
WBC: 11.1 10*3/uL — ABNORMAL HIGH (ref 4.0–10.5)
nRBC: 1 % — ABNORMAL HIGH (ref 0.0–0.2)

## 2020-09-09 LAB — MAGNESIUM: Magnesium: 2 mg/dL (ref 1.7–2.4)

## 2020-09-09 LAB — APTT
aPTT: 76 seconds — ABNORMAL HIGH (ref 24–36)
aPTT: 80 seconds — ABNORMAL HIGH (ref 24–36)
aPTT: 80 seconds — ABNORMAL HIGH (ref 24–36)

## 2020-09-09 LAB — COOXEMETRY PANEL
Carboxyhemoglobin: 1.3 % (ref 0.5–1.5)
Methemoglobin: 0.7 % (ref 0.0–1.5)
O2 Saturation: 46.9 %
Total hemoglobin: 8.7 g/dL — ABNORMAL LOW (ref 12.0–16.0)

## 2020-09-09 LAB — LEGIONELLA PNEUMOPHILA SEROGP 1 UR AG: L. pneumophila Serogp 1 Ur Ag: NEGATIVE

## 2020-09-09 MED ORDER — MORPHINE 100MG IN NS 100ML (1MG/ML) PREMIX INFUSION
0.0000 mg/h | INTRAVENOUS | Status: DC
  Administered 2020-09-09: 2 mg/h via INTRAVENOUS
  Filled 2020-09-09: qty 100

## 2020-09-09 MED ORDER — MORPHINE BOLUS VIA INFUSION
2.0000 mg | INTRAVENOUS | Status: DC | PRN
  Filled 2020-09-09: qty 2

## 2020-09-09 MED ORDER — POTASSIUM CHLORIDE 10 MEQ/50ML IV SOLN
10.0000 meq | INTRAVENOUS | Status: AC
  Administered 2020-09-09 (×4): 10 meq via INTRAVENOUS
  Filled 2020-09-09 (×6): qty 50

## 2020-09-09 MED ORDER — MORPHINE BOLUS VIA INFUSION
5.0000 mg | Freq: Once | INTRAVENOUS | Status: AC
Start: 2020-09-09 — End: 2020-09-09
  Administered 2020-09-09: 5 mg via INTRAVENOUS
  Filled 2020-09-09: qty 5

## 2020-09-09 MED ORDER — POTASSIUM CHLORIDE CRYS ER 20 MEQ PO TBCR: 60.0000 meq | EXTENDED_RELEASE_TABLET | ORAL | Status: DC

## 2020-09-09 MED ORDER — DEXTROSE 50 % IV SOLN
INTRAVENOUS | Status: AC
  Administered 2020-09-09: 50 mL
  Filled 2020-09-09: qty 50

## 2020-09-09 MED ORDER — SODIUM CHLORIDE 0.9 % IV SOLN
0.0170 mg/kg/h | INTRAVENOUS | Status: DC
  Administered 2020-09-09: 0.021 mg/kg/h via INTRAVENOUS
  Filled 2020-09-09: qty 250

## 2020-09-10 LAB — GLUCOSE, CAPILLARY
Glucose-Capillary: 85 mg/dL (ref 70–99)
Glucose-Capillary: 83 mg/dL (ref 70–99)
Glucose-Capillary: 84 mg/dL (ref 70–99)

## 2020-09-10 LAB — HEPARIN INDUCED PLATELET AB (HIT ANTIBODY): Heparin Induced Plt Ab: 0.105 OD (ref 0.000–0.400)

## 2020-09-18 NOTE — Progress Notes (Signed)
OT Cancellation Note  Patient Details Name: Erica Duncan MRN: [id number] DOB: 1950-03-23   Cancelled Treatment:    Reason Eval/Treat Not Completed: Other (comment)- plan for comfort care transition due to continued medical decline.  OT to sign off.   Barry Brunner, OT Acute Rehabilitation Services Pager [phone] Office [phone]   Chancy Milroy 09/04/2020, 9:51 AM

## 2020-09-18 NOTE — Progress Notes (Signed)
PT Cancellation Note  Patient Details Name: Dhalia Zingaro MRN: [id number] DOB: [birth year]   Cancelled Treatment:    Reason Eval/Treat Not Completed: Patient not medically ready (pt with continued medical decline and going comfort care. Will sign off)   Lavaya Defreitas B Genasis Zingale 07-Oct-2020, 9:41 AM Merryl Hacker, PT Acute Rehabilitation Services Pager: [phone] Office: [phone]

## 2020-09-18 NOTE — Progress Notes (Addendum)
ANTICOAGULATION CONSULT NOTE  Pharmacy Consult for bivalirudin Indication: atrial fibrillation and IABP  No Known Allergies  Patient Measurements: Height: 5' (152.4 cm) Weight: 68.8 kg (151 lb 10.8 oz) IBW/kg (Calculated) : 45.5 Dosing Weight: 68.8kg  Vital Signs: Temp: 98.1 F (36.7 C) (08/23 0015) Temp Source: Core (08/23 0000) BP: 105/68 (08/23 0000) Pulse Rate: 119 (08/23 0015)  Labs: Recent Labs    09/06/20 1206 09/07/20 0332 09/07/20 1232 09/08/20 0349 09/08/20 1329 09/08/20 1843 09/08/20 1943 09/08/20 2339  HGB 8.2* 8.1*  --  8.0*  --   --   --   --   HCT 25.0* 23.8*  --  23.5*  --   --   --   --   PLT PLATELET CLUMPS NOTED ON SMEAR, UNABLE TO ESTIMATE 100*  --  90*  --   --   --   --   APTT  --   --   --   --  84* 82*  --  80*  HEPARINUNFRC 0.27* 0.19* 0.20* 0.16*  --   --   --   --   CREATININE 2.31* 2.26* 2.26* 2.40*  --  2.45* 2.43*  --      Estimated Creatinine Clearance: 18.4 mL/min (A) (by C-G formula based on SCr of 2.43 mg/dL (H)).   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 (HCC) 08/28/2020   Uncontrolled type 2 diabetes mellitus with hyperglycemia, with long-term current use of insulin (HCC) 08/28/2020    Assessment: 70 yo F in cardiogenic shock, now with IABP placed and a fib on amio gtt.  Patient with down-trending platelets. Baseline 140-150, now at 90 concerning for potential HIT. Heparin changed to bivalrudin. SRA labs ordered. 4 T's score of 3.   aPTT remains elevated above low goal at 80 sec on bivalirudin at 0.025 mg/kg (3.4 ml/hr). No bleeding noted.  Goal of Therapy:  aPTT 50-65 seconds Monitor platelets by anticoagulation protocol: Yes   Plan:  Reduce bivalirudin to 0.021 mg/kg/hr (2.9 ml/hr) Check aPTT in 4 hours   Christoper Fabian, PharmD, BCPS Please see  amion for complete clinical pharmacist phone list 08/24/2020 12:21 AM  ADDENDUM (0545) aPTT remains elevated above lower goal range at 76 sec on bivalirudin at 0.021 mg/kg/hr (2.9 ml/hr). No bleeding noted.  Plan: Reduce bivalirudin to 0.017 mg/kg/hr (2.3 ml/hr) F/u 4h aPTT  Christoper Fabian, PharmD, BCPS Please see amion for complete clinical pharmacist phone list 08/22/2020 5:45 AM

## 2020-09-18 NOTE — Progress Notes (Addendum)
Patient ID: Erica Duncan, female   DOB: 1950-08-16, 70 y.o.   MRN: [id number]    Advanced Heart Failure Rounding Note   Subjective:    8/11 Admitted with abdominal pain -> developed shock, AKI and shock liver. EF 15%. Developed AF 8/15 AHF team consulted moved to IC. Started milrinone and NE 8/17:  Diuresed with IV lasix.  Hypotensive with increased pressor requirements with Norepi up to 55 mcg. Given 250 cc NS bolus. Milrinone cut back to 0.125 mcg. Imrproved with NE back down to 30 mcg. Hgb down 9.9. Sent for stat CT abd --negative for bleed. Heparin stopped.  8/17 VT arrest -> CPR & defib x 1 8/20 IABP placed 8/21 Unable to wean vasopressin and amio. In and out of A fib. Placed on lasix drip.  8/22 Increased oxygen demands placed on Bipap and started on precedex due to agitation.    She has worsening shock despite multiple pressors and IABP support. IABP 1:1. Remains on Milrinone 0.375, NE 22, VP 0.01. Co-ox 47%.    On lasix gtt at 10/hr. Good UOP. -2.5L out yesterday. CVP 10-11  Creatinine 2.25 => 2.37 => 2.26=> 2.4=>2.5   K 3.5   In and out of A fib. Remains on amiodarone gtt 60 + heparin gtt.   Plts continue to tend down 140-->110>100>90>91. Hgb stable 8.8. IABP site ok.     PCT 1.7 => 4.29, on empiric vancomycin + meropenum with negative cultures.  Afebrile. WBCs 13.3>>11.1    Looks uncomfortable. Moaning. On heated HF [place].   Swan #s  PAP: (37-60)/(15-27) 51/17 CVP:  [9 mmHg-20 mmHg] 10 mmHg PCWP:  [19 mmHg-21 mmHg] 20 mmHg CO:  [3.2 L/min-3.8 L/min] 3.7 L/min CI:  [2 L/min/m2-2.3 L/min/m2] 2.3 L/min/m2 Co-ox 47%  SVR 1254 PAPi 3.4 CPO 0.55    LHC/RHC 8/19 on NE 30, mil 0.125, VP 0.03: Ao = 91/58 (71) LV = 90/23 RA = 15 RV = 47/16 PA = 60/28 (40) PCW = 32 (v = 40) Fick cardiac output/index = 2.9/1.7 PVR = 2.8 WU SVR = 1572 FA sat = 90% PA sat = 32%, 34% PAPI = 2.1 CPO = 0.45  Assessment: 1. Normal coronary arteries 2. Severe NICM 3.  Cardiogenic shock  Objective:   Weight Range:  Vital Signs:   Temp:  [96.6 F (35.9 C)-98.4 F (36.9 C)] 97.9 F (36.6 C) (08/23 0700) Pulse Rate:  [62-119] 86 (08/23 0700) Resp:  [16-34] 33 (08/23 0700) BP: (95-140)/(46-97) 124/88 (08/23 0700) SpO2:  [90 %-100 %] 94 % (08/23 0700) FiO2 (%):  [30 %-70 %] 60 % (08/23 0651) Weight:  [70.9 kg] 70.9 kg (08/23 0500) Last BM Date: 09/02/20  Weight change: Filed Weights   09/07/20 0500 09/08/20 0500 09-26-2020 0500  Weight: 71 kg 68.8 kg 70.9 kg    Intake/Output:   Intake/Output Summary (Last 24 hours) at 2020/09/26 0709 Last data filed at Sep 26, 2020 0700 Gross per 24 hour  Intake 3045.01 ml  Output 2455 ml  Net 590.01 ml    PHYSICAL EXAM: CVP 10-11 General:  ill appearing, looks uncomfortable, moaning but no respiratory distress HEENT: normal Neck: supple. JVD elevated to jaw. + Rt IJ Swam Carotids 2+ bilat; no bruits. No lymphadenopathy or thyromegaly appreciated. Cor: PMI nondisplaced. Irregularly irregular rhythm, tachy rate. No rubs, gallops or murmurs. Lungs: clear Abdomen: soft, nontender, nondistended. No hepatosplenomegaly. No bruits or masses. Good bowel sounds. Extremities: no cyanosis, clubbing, rash, 2+ bilateral LE edema + Rt Femoral IABP (site stable)  Neuro: alert & oriented x 3, cranial nerves grossly intact. moves all 4 extremities w/o difficulty. Affect pleasant.   Telemetry: In and out of A fib/SR. HR 80s   Labs: Basic Metabolic Panel: Recent Labs  Lab 08/26/2020 0202 08/31/2020 0815 09/07/20 0332 09/07/20 1232 09/08/20 0349 09/08/20 1843 09/08/20 1943 09/13/2020 0453 08/30/2020 0456  NA 132*   < > 132* 134* 133* 134* 135 134* 135  K 3.7   < > 2.9* 3.2* 5.0 2.5* 3.0* 3.2* 3.0*  CL 100   < > 100 101 99 98 99 101  --   CO2 21*   < > 22 23 21* 23 22 20*  --   GLUCOSE 109*   < > 102* 78 103* 137* 129* 144*  --   BUN 48*   < > 41* 40* 41* 41* 41* 43*  --   CREATININE 2.15*   < > 2.26* 2.26* 2.40* 2.45*  2.43* 2.47*  --   CALCIUM 8.8*   < > 8.5* 8.6* 8.7* 8.9 8.9 8.8*  --   MG 2.0  --  1.6* 2.0 1.9  --   --  2.0  --    < > = values in this interval not displayed.    Liver Function Tests: Recent Labs  Lab 09/04/20 0428 09/07/2020 0202 09/06/20 0516 09/08/20 0349 09/12/2020 0453  AST 200* 106* 48* 77* 36  ALT 363* 293* 180* 98* 72*  ALKPHOS 130* 140* 109 98 96  BILITOT 1.6* 1.3* 2.0* 3.1* 2.5*  PROT 5.3* 6.0* 5.5* 5.7* 5.6*  ALBUMIN 2.1* 2.3* 2.0* 1.9* 1.7*   No results for input(s): LIPASE, AMYLASE in the last 168 hours. Recent Labs  Lab 09/03/20 0313 08/23/2020 2126  AMMONIA 28 26    CBC: Recent Labs  Lab 09/06/20 0516 09/06/20 1206 09/07/20 0332 09/08/20 0349 09/08/2020 0453 09/03/2020 0456  WBC 12.7* 12.5* 12.9* 13.3* 11.1*  --   HGB 8.3* 8.2* 8.1* 8.0* 7.9* 8.8*  HCT 24.5* 25.0* 23.8* 23.5* 22.3* 26.0*  MCV 66.0* 67.2* 66.3* 65.5* 63.4*  --   PLT 110* PLATELET CLUMPS NOTED ON SMEAR, UNABLE TO ESTIMATE 100* 90* 91*  --     Cardiac Enzymes: No results for input(s): CKTOTAL, CKMB, CKMBINDEX, TROPONINI in the last 168 hours.  BNP: BNP (last 3 results) No results for input(s): BNP in the last 8760 hours.  ProBNP (last 3 results) No results for input(s): PROBNP in the last 8760 hours.    Other results:  Imaging: DG CHEST PORT 1 VIEW  Result Date: 09/08/2020 CLINICAL DATA:  Intra RIGHT balloon pump, abdominal pain, code stroke, type II diabetes mellitus, hypertension EXAM: PORTABLE CHEST 1 VIEW COMPARISON:  Portable exam 0528 hours compared to 09/07/2020 FINDINGS: Tip of intra-aortic balloon pump projects 4.5 cm below aortic arch in descending thoracic aorta. LEFT jugular central venous catheter with tip projecting over upper RIGHT atrium. RIGHT jugular [name] catheter tip projects over RIGHT pulmonary artery at hilum. Enlargement of cardiac silhouette with vascular congestion. Asymmetric pulmonary infiltrates RIGHT greater than LEFT unchanged question asymmetric  pulmonary edema, though infection is not excluded with this pattern. Dense opacification of retrocardiac LEFT lower lobe may represent superimposed atelectasis or consolidation. No pleural effusion or pneumothorax. IMPRESSION: Persistent asymmetric pulmonary infiltrates question asymmetric pulmonary edema unchanged. Atelectasis versus consolidation LEFT lower lobe. Tip of balloon pump projects 4.5 cm below aortic arch. Electronically Signed   By: Ulyses Southward M.D.   On: 09/08/2020 08:29   DG CHEST PORT 1  VIEW  Result Date: 09/07/2020 CLINICAL DATA:  Follow-up for congestive heart failure. EXAM: PORTABLE CHEST 1 VIEW COMPARISON:  08/21/2020 and older studies. FINDINGS: Right greater than left airspace lung opacities are without significant change from the prior study. Probable small effusions.  No pneumothorax. Left internal jugular central venous line and right internal jugular Swan-Ganz catheter, as well as inter aortic balloon pump, are stable from the prior exam. IMPRESSION: 1. No significant change from the most recent prior chest radiograph. 2. Stable bilateral pulmonary airspace opacities consistent with edema. 3. No change in support apparatus. Electronically Signed   By: Amie Portland M.D.   On: 09/07/2020 12:09   DG Abd Portable 1V  Result Date: 09/08/2020 CLINICAL DATA:  Balloon pump, abdominal pain, code stroke, hypertension, diabetes mellitus EXAM: PORTABLE ABDOMEN - 1 VIEW COMPARISON:  Portable exam 0531 hours without priors for comparison FINDINGS: RIGHT femoral line with tip projecting over RIGHT common iliac vein. Normal bowel gas pattern. No bowel dilatation or bowel wall thickening. Osseous structures unremarkable. IMPRESSION: No acute abnormalities. Electronically Signed   By: Ulyses Southward M.D.   On: 09/08/2020 08:30     Medications:     Scheduled Medications:  chlorhexidine  15 mL Mouth Rinse BID   Chlorhexidine Gluconate Cloth  6 each Topical Daily   insulin aspart  0-15 Units  Subcutaneous TID WC   insulin aspart  0-5 Units Subcutaneous QHS   insulin aspart  4 Units Subcutaneous TID WC   insulin glargine-yfgn  20 Units Subcutaneous QHS   mouth rinse  15 mL Mouth Rinse q12n4p   senna  2 tablet Oral Daily   sodium chloride flush  10-40 mL Intracatheter Q12H   sodium chloride flush  3 mL Intravenous Once   sodium chloride flush  3 mL Intravenous Q12H   sodium chloride flush  3 mL Intravenous Q12H    Infusions:  sodium chloride     sodium chloride     sodium chloride Stopped (08/25/2020 0407)   sodium chloride Stopped (09/16/2020 2156)   sodium chloride 10 mL/hr at 08/21/2020 1900   sodium chloride 10 mL/hr at 09/16/2020 0700   amiodarone 60 mg/hr (08/26/2020 0700)   bivalirudin (ANGIOMAX) infusion 0.5 mg/mL (Non-ACS indications) 0.017 mg/kg/hr (08/18/2020 0700)   dexmedetomidine (PRECEDEX) IV infusion 0.2 mcg/kg/hr (09/08/20 0653)   epinephrine Stopped (09/06/20 1009)   furosemide (LASIX) 200 mg in dextrose 5% 100 mL (2mg /mL) infusion 10 mg/hr (08/20/2020 0700)   levETIRAcetam Stopped (09/10/2020 0450)   meropenem (MERREM) IV Stopped (09/08/20 2237)   milrinone 0.375 mcg/kg/min (09/02/2020 0700)   norepinephrine (LEVOPHED) Adult infusion 22 mcg/min (08/28/2020 0700)   potassium chloride 10 mEq (09/13/2020 0706)   sodium chloride     vancomycin Stopped (09/08/20 1227)   vasopressin 0.01 Units/min (09/01/2020 0700)    PRN Medications: sodium chloride, Place/Maintain arterial line **AND** sodium chloride, sodium chloride, sodium chloride, acetaminophen, LORazepam, ondansetron (ZOFRAN) IV, polyethylene glycol, sodium chloride, sodium chloride flush, sodium chloride flush, traMADol, zolpidem   Assessment/Plan :   1. Acute systolic HF -> cardiogenic shock - New diagnosis this admit, nonischemic cardiomyopathy with no CAD on 8/19 cath.  - EF ~ 20-25%, MR, TR on echo - Repeat echo 9/18 25-30% - Initial lactate 4.8 -> 1.5. Co-ox 41% on 8/15 - Based on high procalcitonin, suspect  mixed cardiogenic/septic shock.  - Persistent shock despite inotropes/pressors, IABP placed 8/19.   - Now with IABP 1:1, milrinone 0.375, NE 22, vasopressin 0.01.  Co-ox 47% now with CI  2.3 - No GDMT yet due to shock - CVP 10. Continue lasix drip 10 mg per hour.  - Supp K - Age prohibits transplant. If doesn't begin to stabilize will need to consider whether she is candidate for Impella 5.5 bridge to recovery or possible durable VAD.   2. VT arrest on 8/17 - in setting of hypokalemia - Continue amiodarone gtt  - Keep K > 4.0 Mg > 2.0 - Supp k     3. Atrial fibrillation/flutter with RVR: - Was in NSR on admit. Noted 08/14. In/out AF on 8/15 - She has been in and out of A fib.   - Continue heparin gtt for now with AF/AFL and IABP.  Hgb stable  - Continue amiodarone gtt 60 mg per hour.    4. AKI on CKD, stage IIIb - Likely due to ATN in setting of #1 - Cr trending up 2.37 -> 2.26->2.4->2.5 .  - nonoliguric    5. Shock liver - LFTs have been trending down, daily CMET.    6. Insulin dependent DM II -SSI -Alc 7.9   7. Hypokalemia -  K 3.5. Mg 2.0 - supp K    8. Hx CVA/seizure activity -Followed by Neurology -On Keppra -No acute infarct on MRI brain and CT head  9. Microcytic anemia, iron-deficiency - iron sats low - given IV Iron 8/17 - CT abd negative for bleed.  - No obvious source of bleeding.  - on bival   10. Thrombocytopenia - Platelets down 110>100>90>91. - Heparin stopped. Now on Bival - HIT pending  - No obvious source.   11. ID - On vancomycin/meropenum empirically with shock and PCT elevated at 1.7 => 4.29.  No definite source. Afebrile today with WBCs down trending 13.3>>11.1, cultures NGTD.   12. Acute Hypoxemic Respiratory Failure - on HFNC   13. Protein Malnutrition  -Poor po intake 48-72 hours.  - Albumin 1.9  - prealbumin <5.0  - Dietary recommends Cor-track for TFs   Length of Stay: 12   Brittainy Simmons  PA-C  08/24/2020, 7:09  AM  Advanced Heart Failure Team Pager [phone] (M-F; 7a - 4p)  Please contact [facility] for night-coverage after hours (4p -7a ) and weekends on [email or web address]   Agree with above.   Moaning in pain. Unable to answer questions Just repeats what is said to her. Remains on multiple pressors and IABP. Co-ox 47%. Pre-albumin < 5  General:  Ill appearing. Moaning in pain HEENT: normal Neck: supple.RIJ swan Carotids 2+ bilat; no bruits. No lymphadenopathy or thryomegaly appreciated. Cor: PMI nondisplaced. Regular rate & rhythm. No rubs, gallops or murmurs. Lungs: coarse Abdomen: soft, nontender, nondistended. No hepatosplenomegaly. No bruits or masses. Good bowel sounds. Extremities: no cyanosis, clubbing, rash, 2+ edema +RFA IABP Neuro: moaning in pain. Not following commands  She is terminally ill and continues to deteriorate despite maximum support. Prealbumin < 5.0. I do not think there is anything we can do to save her at this point. Discussed situation with his daughter. Will switch to comfort care once family present. Start precedex now due to discomfort.   CRITICAL CARE Performed by: Arvilla MeresBensimhon, Artia Singley  Total critical care time: 45 minutes  Critical care time was exclusive of separately billable procedures and treating other patients.  Critical care was necessary to treat or prevent imminent or life-threatening deterioration.  Critical care was time spent personally by me (independent of midlevel providers or residents) on the following activities: development of treatment plan with patient and/or surrogate  as well as nursing, discussions with consultants, evaluation of patient's response to treatment, examination of patient, obtaining history from patient or surrogate, ordering and performing treatments and interventions, ordering and review of laboratory studies, ordering and review of radiographic studies, pulse oximetry and re-evaluation of patient's condition.    Arvilla Meres, MD  9:11 AM

## 2020-09-18 NOTE — Progress Notes (Signed)
Nutrition Brief Note ? ?Chart reviewed. ?Pt now transitioning to comfort care.  ?No further nutrition interventions planned at this time.  ?Please re-consult as needed.  ? ? Erica Maselli MS, RDN, LDN, CNSC ?Registered Dietitian III ?Clinical Nutrition ?RD Pager and On-Call Pager Number Located in Amion  ? ? ?

## 2020-09-18 NOTE — Progress Notes (Signed)
RT note- Patient has been made comfort care, oxygen removed. Family at the bedside.

## 2020-09-18 NOTE — Progress Notes (Signed)
Inpatient Diabetes Program Recommendations  AACE/ADA: New Consensus Statement on Inpatient Glycemic Control   Target Ranges:  Prepandial:   less than 140 mg/dL      Peak postprandial:   less than 180 mg/dL (1-2 hours)      Critically ill patients:  140 - 180 mg/dL   Results for EMANUEL, CAMPOS (MRN [id number]) as of 08/30/2020 11:16  Ref. Range 09/08/2020 08:04 09/08/2020 11:21 09/08/2020 16:21 09/08/2020 21:43 09/15/2020 06:33 09/03/2020 08:13 09/08/2020 09:06  Glucose-Capillary Latest Ref Range: 70 - 99 mg/dL 366 (H) 440 (H) 347 (H) 93 111 (H) 62 (L) 178 (H)    Review of Glycemic Control  Current orders for Inpatient glycemic control: Semglee 20 units QHS, Novolog 4 units TID with meals, Novolog 0-15 units TID with meals, Novolog 0-5 units QHS  Inpatient Diabetes Program Recommendations:    Insulin: Noted plan for comfort care. Please discontinue insulin.  Thanks, Orlando Penner, RN, MSN, CDE Diabetes Coordinator Inpatient Diabetes Program [phone] (Team Pager from 8am to 5pm)

## 2020-09-18 NOTE — Death Summary Note (Addendum)
  Advanced Heart Failure Death Summary  Death Summary   Patient ID: Erica Duncan MRN: [id number], DOB/AGE: [birth year] 70 y.o. Admit date: 08/25/2020 D/C date:     09/21/20   Primary Discharge Diagnoses:  Severe Refractory Cardiogenic Shock  Septic Shock  Acute Biventricular Systolic Heart Failure/ Nonischemic CM VT Arrest w/ Brief CPR + Defib x 1  Atrial Fibrillation w/ RVR  Nonoliguric AKI on Stage IIIb CKD  Shock Liver Acute Hypoxic Respiratory Failure  Severe Mitral Regurgitation  Severe Tricuspid Regurgitation  Severe Protein Malnutrition  Seizure Activity  Insulin Dependent Type 2 Diabetes Mellitus  Thrombocytopenia    Hospital Course:    70 y/o female with stage IIIb CKD and IDT2DM but no previous cardiac history , admitted with acute systolic HF in setting of AF w/ RVR of unknown duration.     Echo with severe biventricular dysfunction EF 20%. Severe MR/TR.   Developed progressive cardiogenic shock and MSOF (AKI, shock liver and acute hypoxic respiratory failure). Bicarb down to 10. ABG with severe metabolic acidosis. Lactic acid 4.8. She was moved to ICU, central access obtained and started on inotropic support w/ milrinone and NE. Placed on amio gtt for Afib. Hospital course c/b by VT arrest on 8/18, had brief CPR with shock x 1, with abrupt ROSC.  Also w/ ? Seizure activity. Neuro consulted. No acute infarct on head CT and MRI. EEG unrevealing. R/LHC on 8/19 c/w severe NICM/ Cardiogenic shock, normal coronaries. Fick cardiac output/index = 2.9/1.7, PAPi 2.1, CPO 0.45. IABP placed for additional support. Swan left in place for continuous hemodynamic monitoring. Despite dual inotropic + MCS, she continued w/ refractory cardiogenic shock w/ developing septic shock component. PCT elevated at 1.7 => 4.29. Developed increased pressor requirements and drop in SVR. NE further increased and VP added. Placed on empiric abx w/ vancomycin + cefepime. Abx coverage later  expanded to vanc + meropenum to cover atypical's. Cultures remained negative.   Given refractory shock, transition to Impella 5.5 as a bridge to durable VAD was considered, however she was not felt to be a suitable candidate for VAD given severe protein malnutrition w/ severely low prealbumin at <5.0. GOC were discussed w/ family and decision made to transition to full comfort care. Patient expired 2020/09/21.     Significant Diagnostic Studies  Echo 8/15: LVEF 20%, Severe MR/TR Limited Echo 8/18: LVEF 25-30%, RV moderately reduced   R/LHC 8/19:   Done on NE 30, milrinone 0.125, VP 0.03   Ao = 91/58 (71) LV = 90/23 RA = 15 RV = 47/16 Erica = 60/28 (40) PCW = 32 (v = 40) Fick cardiac output/index = 2.9/1.7 PVR = 2.8 WU SVR = 1572 FA sat = 90% Erica sat = 32%, 34% PAPI = 2.1 CPO = 0.45   Assessment: 1. Normal coronary arteries 2. Severe NICM 3. Cardiogenic shock  Consultations  Advanced Heart Failure Pulmonary Critical Care Medicine  Neurology   Duration of Discharge Encounter: Greater than 35 minutes   Signed, Robbie Lis, Erica-C 21-Sep-2020, 12:24 PM  Agree with above.  Arvilla Meres, MD  1:47 PM

## 2020-09-18 NOTE — Progress Notes (Signed)
Verified by 2 RNs patient without pulse, heart tones, respirations and pupillary response.  See post mort flow sheet.  Aline August RN, CCRN

## 2020-09-18 DEATH — deceased

## 2020-09-23 MED FILL — Medication: Qty: 1 | Status: AC

## 2023-01-01 IMAGING — DX DG CHEST 1V PORT
1 series · 1 of 1 positions shown · non-contrast
Comparison: 09/05/2020 and older studies.

CLINICAL DATA: Follow-up for congestive heart failure.

EXAM:
PORTABLE CHEST 1 VIEW

[chest]
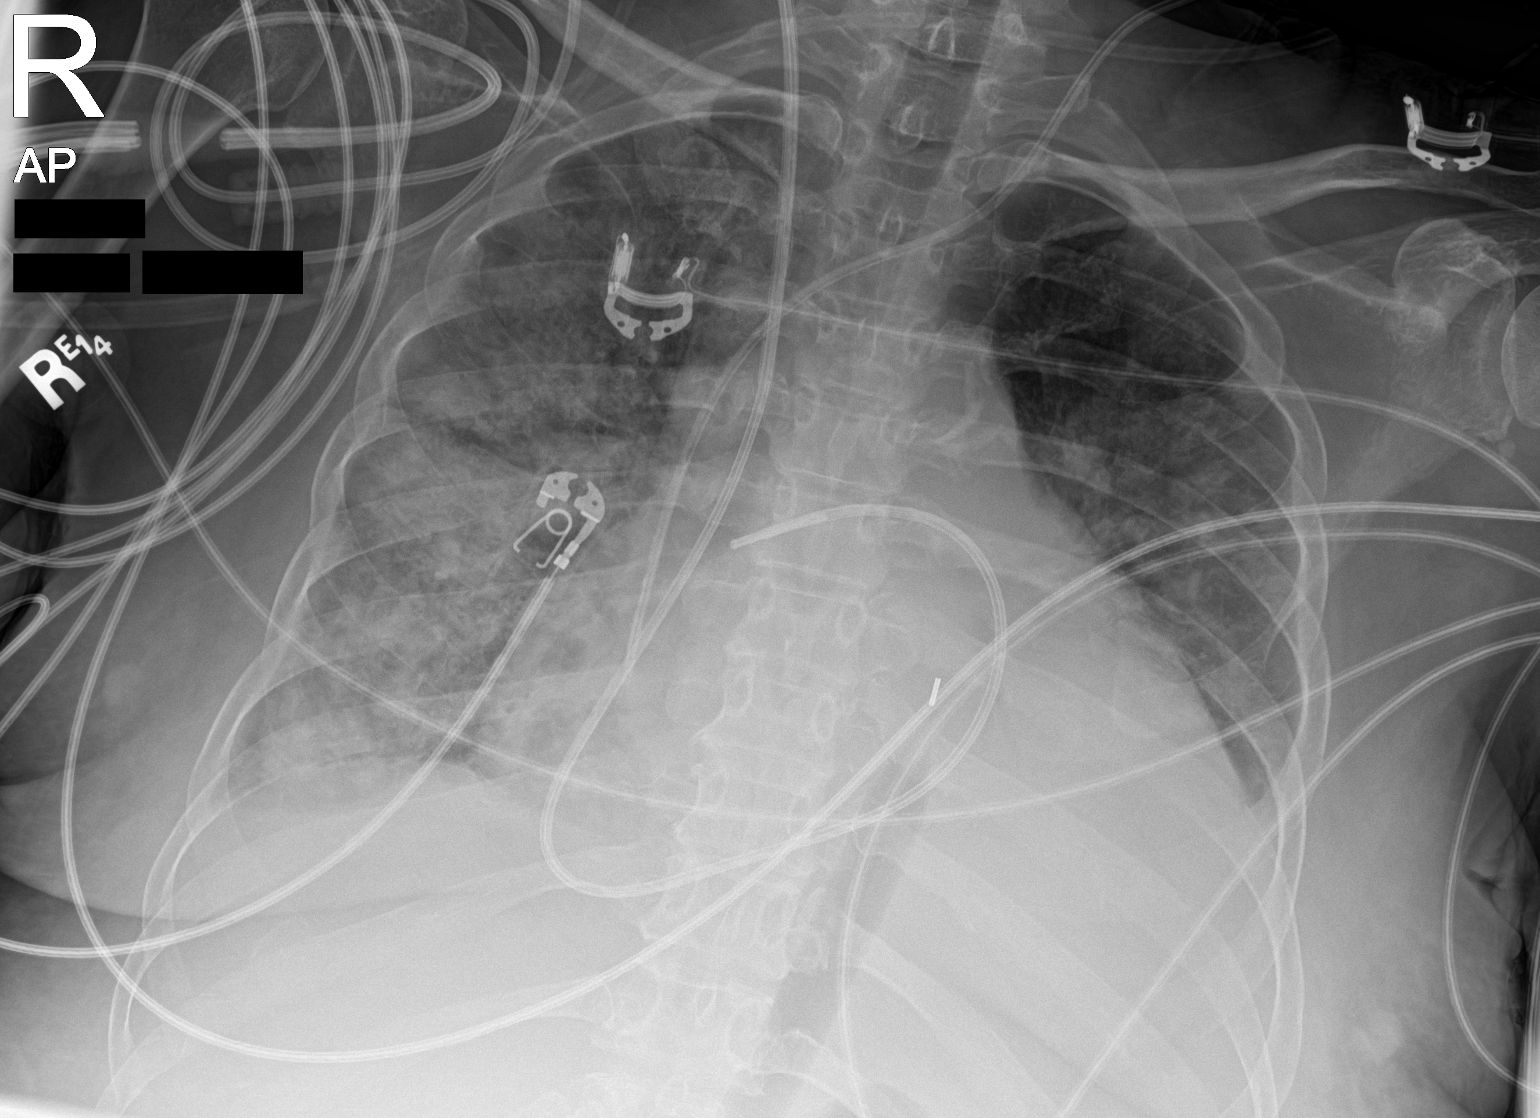

[1 of 1 positions shown; findings below may reference images not displayed]

FINDINGS: Right greater than left airspace lung opacities are without
significant change from the prior study.

Probable small effusions.  No pneumothorax.

Left internal jugular central venous line and right internal jugular
Swan-Ganz catheter, as well as inter aortic balloon pump, are stable
from the prior exam.
IMPRESSION: 1. No significant change from the most recent prior chest
radiograph.
2. Stable bilateral pulmonary airspace opacities consistent with
edema.
3. No change in support apparatus.

## 2023-01-02 IMAGING — DX DG CHEST 1V PORT
1 series · 1 of 1 positions shown · non-contrast
Comparison: Portable exam 5827 hours compared to 09/07/2020

CLINICAL DATA: Intra RIGHT balloon pump, abdominal pain, code
stroke, type II diabetes mellitus, hypertension

EXAM:
PORTABLE CHEST 1 VIEW

[chest]
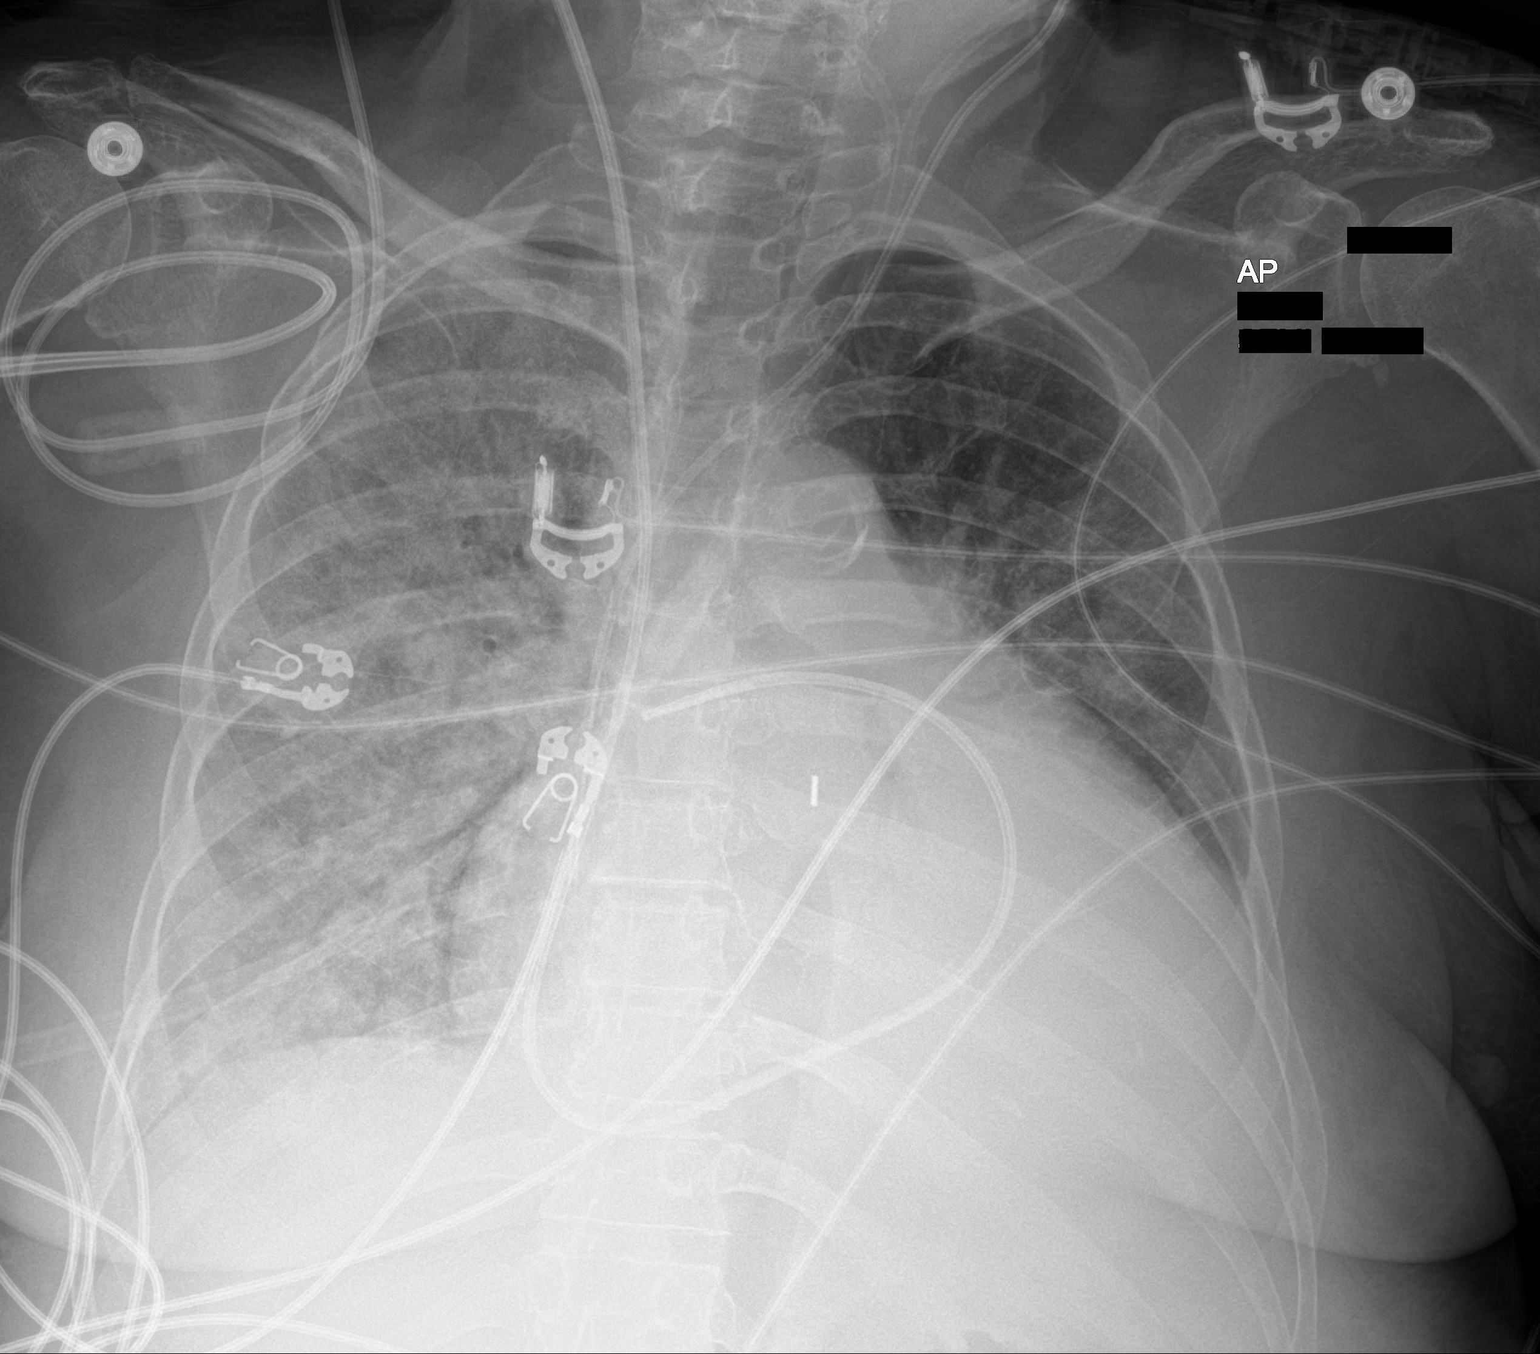

[1 of 1 positions shown; findings below may reference images not displayed]

FINDINGS: Tip of intra-aortic balloon pump projects 4.5 cm below aortic arch
in descending thoracic aorta.

LEFT jugular central venous catheter with tip projecting over upper
RIGHT atrium.

RIGHT jugular Swan Ganz catheter tip projects over RIGHT pulmonary
artery at hilum.

Enlargement of cardiac silhouette with vascular congestion.

Asymmetric pulmonary infiltrates RIGHT greater than LEFT unchanged
question asymmetric pulmonary edema, though infection is not
excluded with this pattern.

Dense opacification of retrocardiac LEFT lower lobe may represent
superimposed atelectasis or consolidation.

No pleural effusion or pneumothorax.
IMPRESSION: Persistent asymmetric pulmonary infiltrates question asymmetric
pulmonary edema unchanged.

Atelectasis versus consolidation LEFT lower lobe.

Tip of balloon pump projects 4.5 cm below aortic arch.
